# Patient Record
Sex: Female | Born: 1945
Health system: Southern US, Community
[De-identification: ages and names within clinical notes are randomized; demographics above are authoritative.]

## PROBLEM LIST (undated history)

## (undated) DIAGNOSIS — I10 Essential (primary) hypertension: Secondary | ICD-10-CM

## (undated) DIAGNOSIS — E785 Hyperlipidemia, unspecified: Secondary | ICD-10-CM

## (undated) DIAGNOSIS — F419 Anxiety disorder, unspecified: Secondary | ICD-10-CM

## (undated) DIAGNOSIS — K5730 Diverticulosis of large intestine without perforation or abscess without bleeding: Secondary | ICD-10-CM

## (undated) HISTORY — PX: TONSILLECTOMY: SUR1361

## (undated) HISTORY — PX: COLONOSCOPY: SHX174

## (undated) HISTORY — DX: Hyperlipidemia, unspecified: E78.5

## (undated) HISTORY — DX: Diverticulosis of large intestine without perforation or abscess without bleeding: K57.30

## (undated) HISTORY — PX: WISDOM TOOTH EXTRACTION: SHX21

## (undated) HISTORY — DX: Essential (primary) hypertension: I10

## (undated) HISTORY — PX: CYSTOSCOPY: SUR368

---

## 1998-10-15 ENCOUNTER — Encounter: Payer: Self-pay | Admitting: Obstetrics and Gynecology

## 1998-10-15 ENCOUNTER — Ambulatory Visit (HOSPITAL_COMMUNITY): Admission: RE | Admit: 1998-10-15 | Discharge: 1998-10-15 | Payer: Self-pay | Admitting: Obstetrics and Gynecology

## 1999-10-07 ENCOUNTER — Other Ambulatory Visit: Admission: RE | Admit: 1999-10-07 | Discharge: 1999-10-07 | Payer: Self-pay | Admitting: Obstetrics and Gynecology

## 2000-11-01 ENCOUNTER — Other Ambulatory Visit: Admission: RE | Admit: 2000-11-01 | Discharge: 2000-11-01 | Payer: Self-pay | Admitting: Obstetrics and Gynecology

## 2001-11-10 ENCOUNTER — Other Ambulatory Visit: Admission: RE | Admit: 2001-11-10 | Discharge: 2001-11-10 | Payer: Self-pay | Admitting: Obstetrics and Gynecology

## 2002-04-20 ENCOUNTER — Encounter: Payer: Self-pay | Admitting: Gastroenterology

## 2002-04-30 ENCOUNTER — Other Ambulatory Visit: Admission: RE | Admit: 2002-04-30 | Discharge: 2002-04-30 | Payer: Self-pay | Admitting: Obstetrics and Gynecology

## 2002-06-20 ENCOUNTER — Encounter: Payer: Self-pay | Admitting: Gastroenterology

## 2002-09-13 ENCOUNTER — Other Ambulatory Visit: Admission: RE | Admit: 2002-09-13 | Discharge: 2002-09-13 | Payer: Self-pay | Admitting: Obstetrics and Gynecology

## 2003-03-19 ENCOUNTER — Other Ambulatory Visit: Admission: RE | Admit: 2003-03-19 | Discharge: 2003-03-19 | Payer: Self-pay | Admitting: Obstetrics and Gynecology

## 2003-07-01 ENCOUNTER — Other Ambulatory Visit: Admission: RE | Admit: 2003-07-01 | Discharge: 2003-07-01 | Payer: Self-pay | Admitting: Obstetrics and Gynecology

## 2003-11-18 ENCOUNTER — Other Ambulatory Visit: Admission: RE | Admit: 2003-11-18 | Discharge: 2003-11-18 | Payer: Self-pay | Admitting: Obstetrics and Gynecology

## 2004-06-30 ENCOUNTER — Other Ambulatory Visit: Admission: RE | Admit: 2004-06-30 | Discharge: 2004-06-30 | Payer: Self-pay | Admitting: Obstetrics and Gynecology

## 2004-08-17 ENCOUNTER — Other Ambulatory Visit: Admission: RE | Admit: 2004-08-17 | Discharge: 2004-08-17 | Payer: Self-pay | Admitting: Obstetrics and Gynecology

## 2004-08-24 ENCOUNTER — Ambulatory Visit: Payer: Self-pay | Admitting: Internal Medicine

## 2004-09-14 ENCOUNTER — Ambulatory Visit: Payer: Self-pay | Admitting: Internal Medicine

## 2004-09-28 ENCOUNTER — Ambulatory Visit: Payer: Self-pay | Admitting: Internal Medicine

## 2005-02-10 ENCOUNTER — Other Ambulatory Visit: Admission: RE | Admit: 2005-02-10 | Discharge: 2005-02-10 | Payer: Self-pay | Admitting: Obstetrics and Gynecology

## 2005-07-09 ENCOUNTER — Other Ambulatory Visit: Admission: RE | Admit: 2005-07-09 | Discharge: 2005-07-09 | Payer: Self-pay | Admitting: Obstetrics and Gynecology

## 2005-11-22 ENCOUNTER — Ambulatory Visit: Payer: Self-pay | Admitting: Internal Medicine

## 2005-12-23 ENCOUNTER — Ambulatory Visit: Payer: Self-pay | Admitting: Internal Medicine

## 2006-01-07 ENCOUNTER — Encounter: Payer: Self-pay | Admitting: Internal Medicine

## 2006-01-07 LAB — CONVERTED CEMR LAB: Pap Smear: NORMAL

## 2006-01-07 LAB — HM MAMMOGRAPHY: HM Mammogram: NORMAL

## 2006-02-21 ENCOUNTER — Ambulatory Visit: Payer: Self-pay | Admitting: Internal Medicine

## 2006-07-04 ENCOUNTER — Ambulatory Visit: Payer: Self-pay | Admitting: Internal Medicine

## 2006-07-04 LAB — CONVERTED CEMR LAB
BUN: 19 mg/dL (ref 6–23)
CO2: 30 meq/L (ref 19–32)
Calcium: 9.2 mg/dL (ref 8.4–10.5)
Chloride: 105 meq/L (ref 96–112)
Chol/HDL Ratio, serum: 4.4
Cholesterol: 258 mg/dL (ref 0–200)
Creatinine, Ser: 0.8 mg/dL (ref 0.4–1.2)
GFR calc non Af Amer: 77 mL/min
Glomerular Filtration Rate, Af Am: 93 mL/min/{1.73_m2}
Glucose, Bld: 117 mg/dL — ABNORMAL HIGH (ref 70–99)
HDL: 59.3 mg/dL (ref >39.0)
LDL DIRECT: 180 mg/dL
Potassium: 3.6 meq/L (ref 3.5–5.1)
Sodium: 142 meq/L (ref 135–145)
Triglyceride fasting, serum: 114 mg/dL (ref 0–149)
VLDL: 23 mg/dL (ref 0–40)

## 2006-07-20 ENCOUNTER — Ambulatory Visit: Payer: Self-pay | Admitting: Internal Medicine

## 2006-09-09 ENCOUNTER — Ambulatory Visit: Payer: Self-pay | Admitting: Internal Medicine

## 2006-09-09 LAB — CONVERTED CEMR LAB
ALT: 19 units/L (ref 0–40)
AST: 18 units/L (ref 0–37)
Cholesterol: 164 mg/dL (ref 0–200)
HDL: 55.9 mg/dL (ref >39.0)
Hgb A1c MFr Bld: 5.7 % (ref 4.6–6.0)
LDL Cholesterol: 92 mg/dL (ref 0–99)
Total CHOL/HDL Ratio: 2.9
Triglycerides: 81 mg/dL (ref 0–149)
VLDL: 16 mg/dL (ref 0–40)

## 2006-09-21 ENCOUNTER — Ambulatory Visit: Payer: Self-pay | Admitting: Internal Medicine

## 2007-04-19 ENCOUNTER — Ambulatory Visit: Payer: Self-pay | Admitting: Internal Medicine

## 2007-04-19 LAB — CONVERTED CEMR LAB
ALT: 21 units/L (ref 0–35)
AST: 17 units/L (ref 0–37)
Cholesterol: 177 mg/dL (ref 0–200)
HDL: 56.1 mg/dL (ref >39.0)
Hgb A1c MFr Bld: 5.8 % (ref 4.6–6.0)
LDL Cholesterol: 106 mg/dL — ABNORMAL HIGH (ref 0–99)
Total CHOL/HDL Ratio: 3.2
Triglycerides: 73 mg/dL (ref 0–149)
VLDL: 15 mg/dL (ref 0–40)

## 2007-05-03 ENCOUNTER — Ambulatory Visit: Payer: Self-pay | Admitting: Internal Medicine

## 2007-05-03 ENCOUNTER — Encounter: Payer: Self-pay | Admitting: Internal Medicine

## 2007-05-03 DIAGNOSIS — I10 Essential (primary) hypertension: Secondary | ICD-10-CM

## 2007-05-03 DIAGNOSIS — E785 Hyperlipidemia, unspecified: Secondary | ICD-10-CM

## 2007-05-03 DIAGNOSIS — H6120 Impacted cerumen, unspecified ear: Secondary | ICD-10-CM

## 2007-05-03 HISTORY — DX: Hyperlipidemia, unspecified: E78.5

## 2007-05-03 HISTORY — DX: Essential (primary) hypertension: I10

## 2007-11-13 ENCOUNTER — Telehealth: Payer: Self-pay | Admitting: Internal Medicine

## 2007-11-13 ENCOUNTER — Ambulatory Visit: Payer: Self-pay | Admitting: Internal Medicine

## 2007-11-13 LAB — CONVERTED CEMR LAB
Bacteria, UA: NEGATIVE
Crystals: NEGATIVE
Hemoglobin, Urine: NEGATIVE
Ketones, ur: NEGATIVE mg/dL
RBC / HPF: NONE SEEN
Urine Glucose: NEGATIVE mg/dL
Urobilinogen, UA: 0.2 (ref 0.0–1.0)

## 2008-06-06 ENCOUNTER — Telehealth: Payer: Self-pay | Admitting: Internal Medicine

## 2008-10-16 ENCOUNTER — Encounter (INDEPENDENT_AMBULATORY_CARE_PROVIDER_SITE_OTHER): Payer: Self-pay | Admitting: *Deleted

## 2008-11-11 ENCOUNTER — Telehealth (INDEPENDENT_AMBULATORY_CARE_PROVIDER_SITE_OTHER): Payer: Self-pay | Admitting: *Deleted

## 2009-01-29 ENCOUNTER — Ambulatory Visit: Payer: Self-pay | Admitting: Internal Medicine

## 2009-01-29 LAB — CONVERTED CEMR LAB
ALT: 16 units/L (ref 0–35)
AST: 16 units/L (ref 0–37)
Alkaline Phosphatase: 87 units/L (ref 39–117)
Basophils Relative: 1.8 % (ref 0.0–3.0)
Bilirubin Urine: NEGATIVE
Bilirubin, Direct: 0.1 mg/dL (ref 0.0–0.3)
Chloride: 102 meq/L (ref 96–112)
Creatinine, Ser: 0.7 mg/dL (ref 0.4–1.2)
Eosinophils Relative: 5.5 % — ABNORMAL HIGH (ref 0.0–5.0)
GFR calc non Af Amer: 89.21 mL/min (ref >60)
Hemoglobin, Urine: NEGATIVE
LDL Cholesterol: 93 mg/dL (ref 0–99)
Lymphocytes Relative: 21.3 % (ref 12.0–46.0)
MCV: 88.5 fL (ref 78.0–100.0)
Monocytes Relative: 8.9 % (ref 3.0–12.0)
Neutrophils Relative %: 62.5 % (ref 43.0–77.0)
RBC: 4.09 M/uL (ref 3.87–5.11)
Total Bilirubin: 0.7 mg/dL (ref 0.3–1.2)
Total CHOL/HDL Ratio: 3
Total Protein, Urine: NEGATIVE mg/dL
Total Protein: 7.2 g/dL (ref 6.0–8.3)
Triglycerides: 66 mg/dL (ref 0.0–149.0)
Urine Glucose: NEGATIVE mg/dL
VLDL: 13.2 mg/dL (ref 0.0–40.0)
WBC: 7.1 10*3/uL (ref 4.5–10.5)
pH: 8 (ref 5.0–8.0)

## 2009-02-04 ENCOUNTER — Ambulatory Visit: Payer: Self-pay | Admitting: Internal Medicine

## 2009-03-07 ENCOUNTER — Telehealth: Payer: Self-pay | Admitting: Internal Medicine

## 2009-03-07 ENCOUNTER — Inpatient Hospital Stay (HOSPITAL_COMMUNITY): Admission: EM | Admit: 2009-03-07 | Discharge: 2009-03-10 | Payer: Self-pay | Admitting: Emergency Medicine

## 2009-03-17 ENCOUNTER — Telehealth: Payer: Self-pay | Admitting: Internal Medicine

## 2009-03-20 ENCOUNTER — Encounter: Payer: Self-pay | Admitting: Internal Medicine

## 2009-04-07 DIAGNOSIS — K5730 Diverticulosis of large intestine without perforation or abscess without bleeding: Secondary | ICD-10-CM

## 2009-04-07 DIAGNOSIS — K5732 Diverticulitis of large intestine without perforation or abscess without bleeding: Secondary | ICD-10-CM | POA: Insufficient documentation

## 2009-04-07 HISTORY — DX: Diverticulosis of large intestine without perforation or abscess without bleeding: K57.30

## 2009-04-10 ENCOUNTER — Ambulatory Visit: Payer: Self-pay | Admitting: Gastroenterology

## 2009-06-13 ENCOUNTER — Ambulatory Visit: Payer: Self-pay | Admitting: Gastroenterology

## 2009-06-23 ENCOUNTER — Encounter: Payer: Self-pay | Admitting: Gastroenterology

## 2009-06-23 ENCOUNTER — Ambulatory Visit: Payer: Self-pay | Admitting: Gastroenterology

## 2009-06-25 ENCOUNTER — Encounter: Payer: Self-pay | Admitting: Gastroenterology

## 2009-09-01 ENCOUNTER — Telehealth: Payer: Self-pay | Admitting: Internal Medicine

## 2009-12-31 ENCOUNTER — Telehealth: Payer: Self-pay | Admitting: Internal Medicine

## 2010-01-01 ENCOUNTER — Telehealth: Payer: Self-pay | Admitting: Internal Medicine

## 2010-01-26 ENCOUNTER — Telehealth: Payer: Self-pay | Admitting: Gastroenterology

## 2010-02-17 ENCOUNTER — Ambulatory Visit: Payer: Self-pay | Admitting: Gastroenterology

## 2010-02-17 ENCOUNTER — Ambulatory Visit: Payer: Self-pay | Admitting: Internal Medicine

## 2010-02-17 LAB — CONVERTED CEMR LAB
AST: 17 units/L (ref 0–37)
Albumin: 4.3 g/dL (ref 3.5–5.2)
Alkaline Phosphatase: 82 units/L (ref 39–117)
BUN: 15 mg/dL (ref 6–23)
Bilirubin Urine: NEGATIVE
CO2: 30 meq/L (ref 19–32)
Chloride: 100 meq/L (ref 96–112)
Cholesterol: 211 mg/dL — ABNORMAL HIGH (ref 0–200)
Creatinine, Ser: 0.7 mg/dL (ref 0.4–1.2)
Eosinophils Relative: 3.8 % (ref 0.0–5.0)
HCT: 37.3 % (ref 36.0–46.0)
Hemoglobin: 13.2 g/dL (ref 12.0–15.0)
Ketones, ur: NEGATIVE mg/dL
Leukocytes, UA: NEGATIVE
Lymphs Abs: 1.2 10*3/uL (ref 0.7–4.0)
MCV: 88.8 fL (ref 78.0–100.0)
Monocytes Absolute: 0.5 10*3/uL (ref 0.1–1.0)
Monocytes Relative: 8.5 % (ref 3.0–12.0)
Neutro Abs: 3.7 10*3/uL (ref 1.4–7.7)
Platelets: 341 10*3/uL (ref 150.0–400.0)
Potassium: 3.8 meq/L (ref 3.5–5.1)
Specific Gravity, Urine: 1.01 (ref 1.000–1.030)
Total Bilirubin: 0.8 mg/dL (ref 0.3–1.2)
Total CHOL/HDL Ratio: 3
Total Protein, Urine: NEGATIVE mg/dL
Triglycerides: 142 mg/dL (ref 0.0–149.0)
Urine Glucose: NEGATIVE mg/dL
VLDL: 28.4 mg/dL (ref 0.0–40.0)
WBC: 5.7 10*3/uL (ref 4.5–10.5)
pH: 6.5 (ref 5.0–8.0)

## 2010-02-20 ENCOUNTER — Ambulatory Visit: Payer: Self-pay | Admitting: Internal Medicine

## 2010-06-04 ENCOUNTER — Telehealth: Payer: Self-pay | Admitting: Internal Medicine

## 2010-06-27 IMAGING — CT CT PELVIS W/ CM
1 of 2 series · 15 of 32 positions shown, 19 images · IV contrast (agent unspecified)
Comparison: None.

CT ABDOMEN

CLINICAL DATA: Lower abdominal pain, nausea, fever, diarrhea,
leukocytosis.

CT ABDOMEN AND PELVIS WITH CONTRAST
TECHNIQUE: Multidetector CT imaging of the abdomen and pelvis was
performed using the standard protocol following bolus
administration of intravenous contrast.
Contrast: 100 ml Imnipaque-EAA

[Series 2: rtn ap with st · axial · 0.63mm/px · z∈[+900,+1315]mm · 15 of 93 slices shown, 19 images]
[im 5/93  soft-tissue]
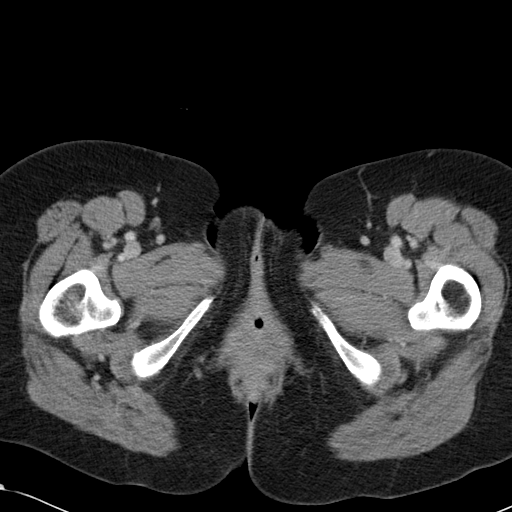
[im 5/93  bone]
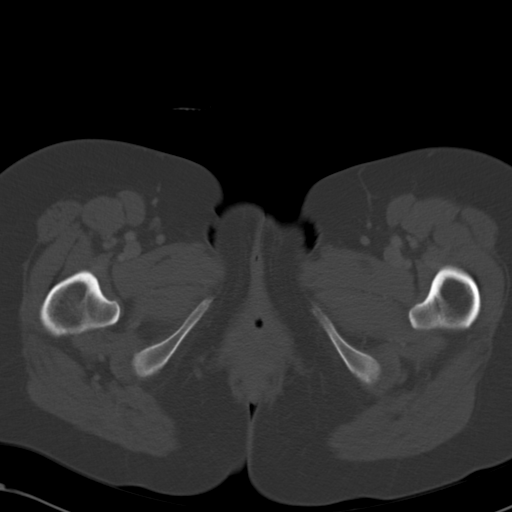
[im 13/93  soft-tissue]
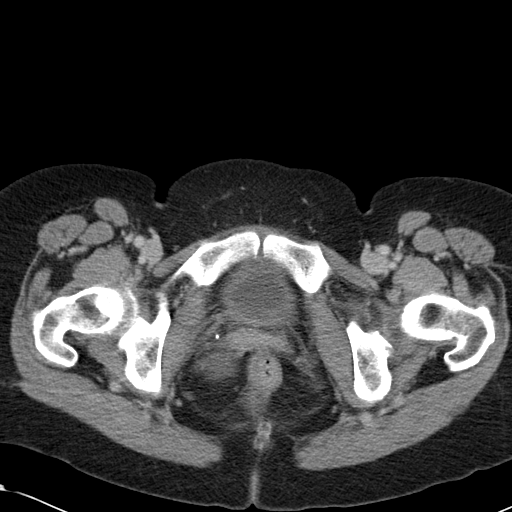
[im 21/93  soft-tissue]
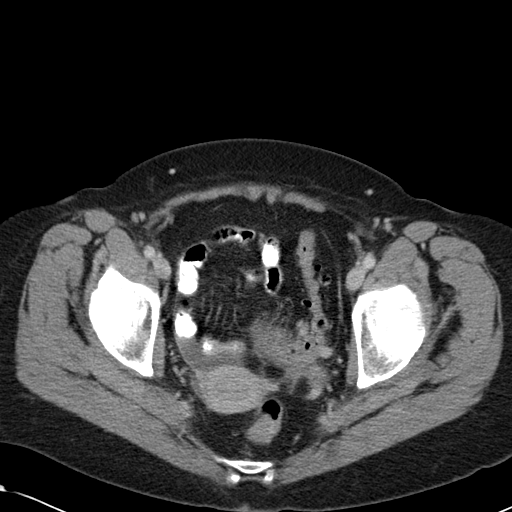
[im 26/93  soft-tissue]
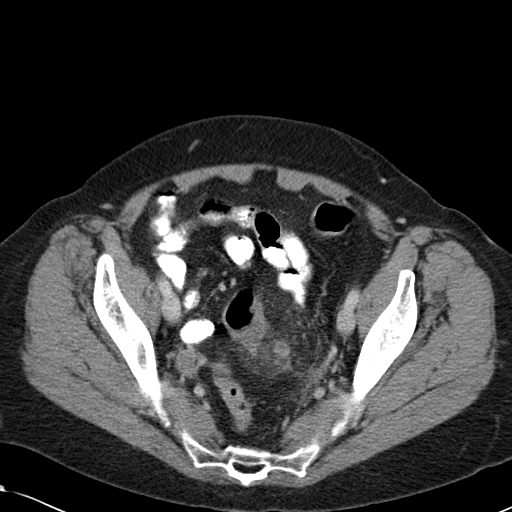
[im 34/93  soft-tissue]
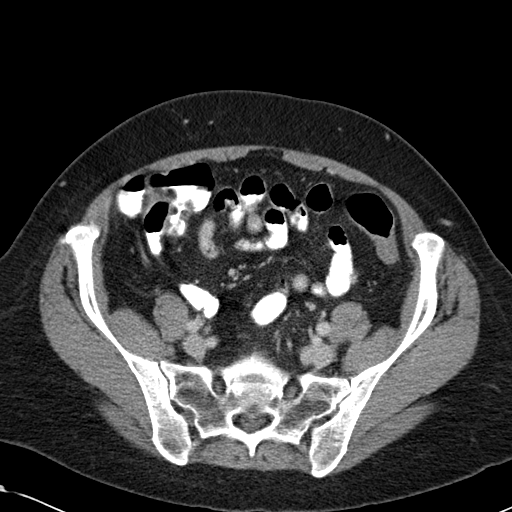
[im 38/93  soft-tissue]
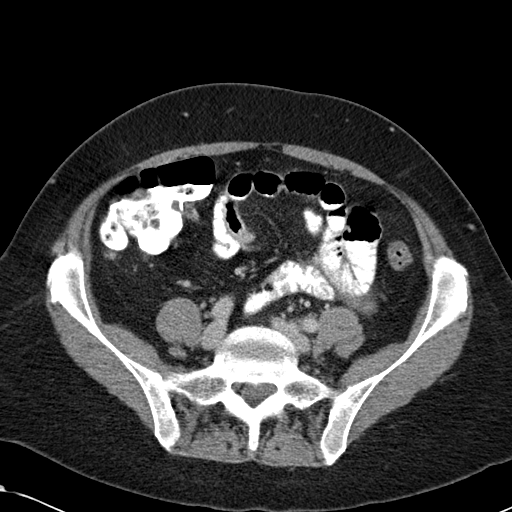
[im 47/93  soft-tissue]
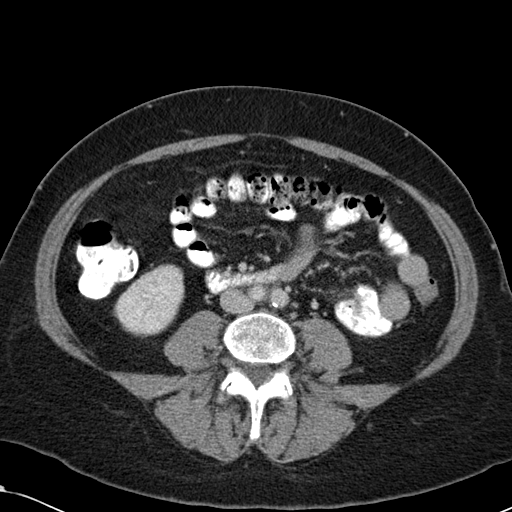
[im 55/93  soft-tissue]
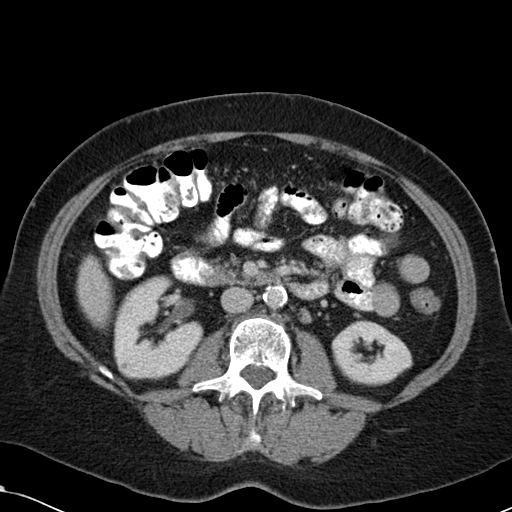
[im 59/93  soft-tissue]
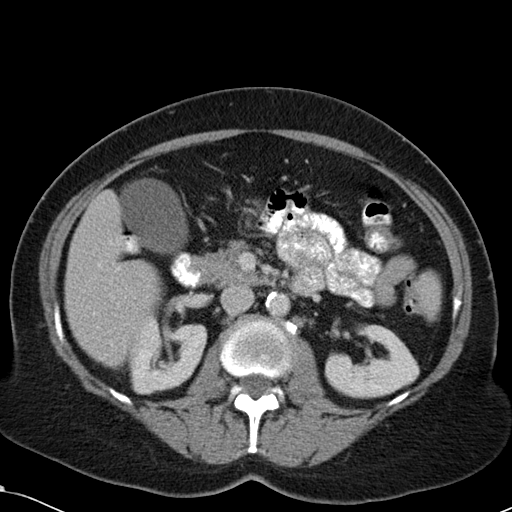
[im 59/93  bone]
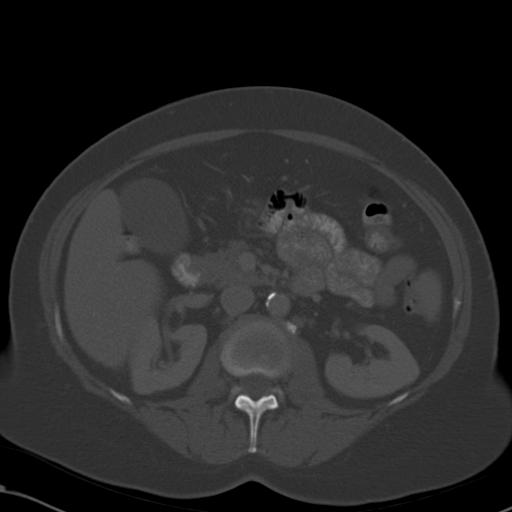
[im 67/93  soft-tissue]
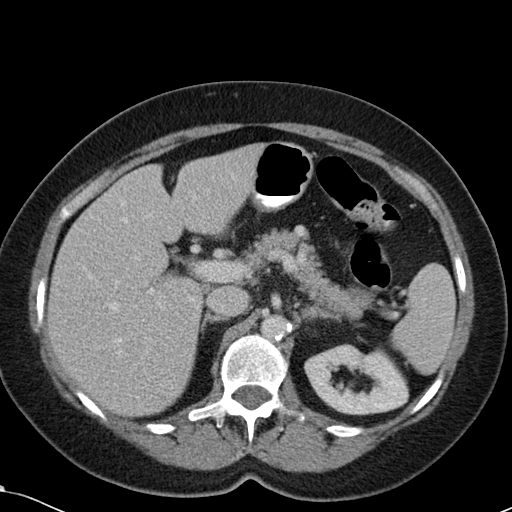
[im 72/93  soft-tissue]
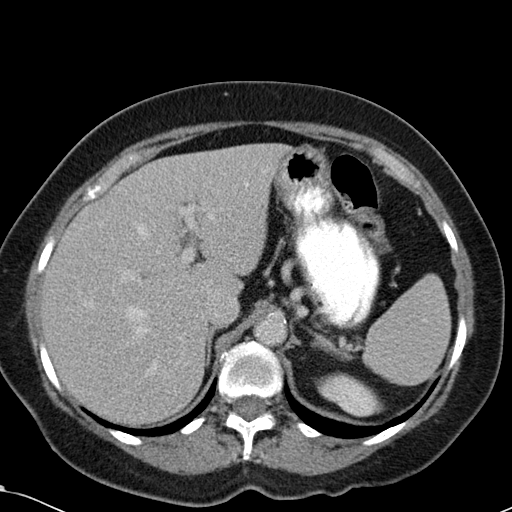
[im 76/93  lung]
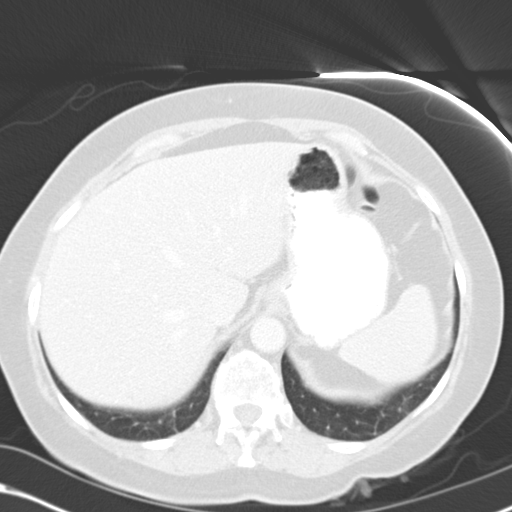
[im 80/93  soft-tissue]
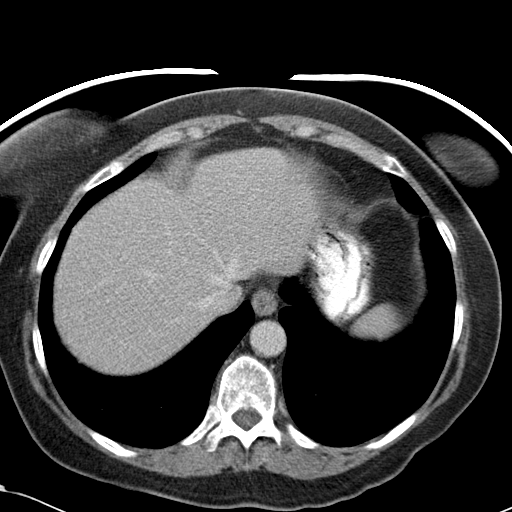
[im 80/93  lung]
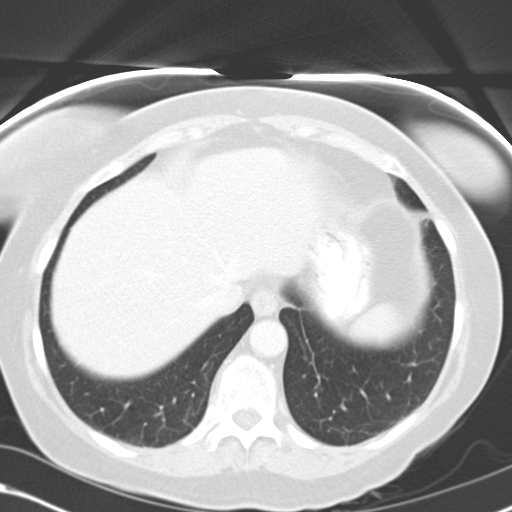
[im 84/93  lung]
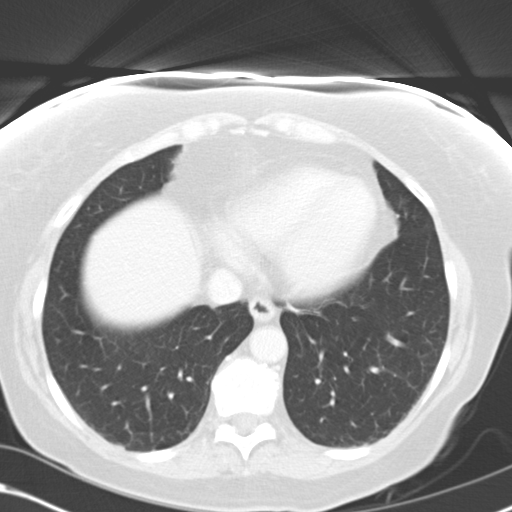
[im 88/93  soft-tissue]
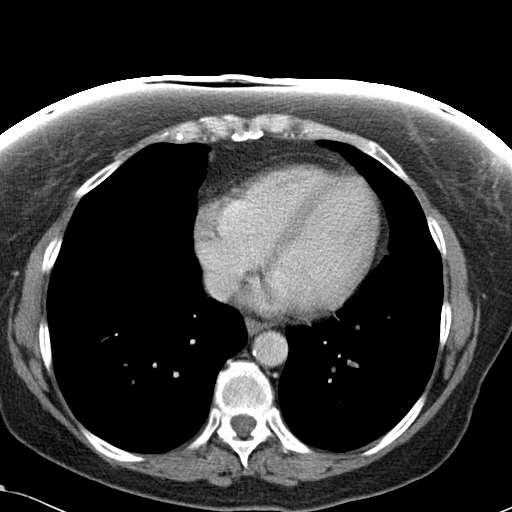
[im 88/93  lung]
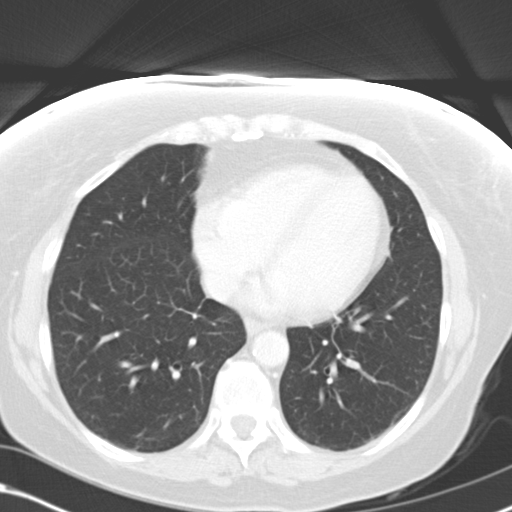

[15 of 32 positions shown; findings below may reference images not displayed]

FINDINGS: Normal appearing liver, spleen, pancreas, gallbladder,
adrenal glands and kidneys.  No gastrointestinal abnormalities or
enlarged lymph nodes.  Clear lung bases.  Mild lumbar and lower
thoracic spine degenerative changes.
IMPRESSION: No acute abnormality.

CT PELVIS
FINDINGS: Concentric wall thickening involving a 5 cm long segment
of the mid sigmoid colon with adjacent soft tissue stranding.
Multiple sigmoid colon diverticula.  No fluid collections.  Minimal
free peritoneal fluid.  Unremarkable uterus and ovaries.  No
enlarged lymph nodes or free peritoneal air.  Unremarkable pelvic
bones.
IMPRESSION: 1.  Sigmoid diverticulitis without abscess.  Follow-up
sigmoidoscopy is recommended to exclude an underlying neoplasm.
2.  Minimal free peritoneal fluid.
3.  Extensive sigmoid diverticulosis.

## 2010-09-08 NOTE — Progress Notes (Signed)
    Immunization History:  Influenza Immunization History:    Influenza:  0.64ml left deltoid exp date 01/2011 (06/04/2010)

## 2010-09-08 NOTE — Assessment & Plan Note (Signed)
Summary: Follow up diverticulitis/dfs    History of Present Illness Visit Type: Follow-up Visit Primary GI MD: Sheryn Bison MD FACP FAGA Primary Provider: Illene Regulus, MD Requesting Provider: n/a Chief Complaint: Patient here for f/u of diverticulitis. She states that she is now doing well, no abdominal pain or diarrhea. She occasionally has some lower abdominal "gassy" pains. 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 She had colonoscopy performed last year which is otherwise unremarkable.  She recently had an episode of left lower quadrant pain which is now resolved after 10 days of Cipro and metronidazole therapy. Currently her bowels are normal and she denies melena or hematochezia, upper GI, with systemic symptomatology. She is scheduled for complete physical examination with Dr. Debby Bud later this week.   GI Review of Systems    Reports abdominal pain and  bloating.     Location of  Abdominal pain: lower abdomen.    Denies acid reflux, belching, chest pain, dysphagia with liquids, dysphagia with solids, heartburn, loss of appetite, nausea, vomiting, vomiting blood, weight loss, and  weight gain.      Reports diverticulosis and  hemorrhoids.     Denies anal fissure, black tarry stools, change in bowel habit, constipation, diarrhea, fecal incontinence, heme positive stool, irritable bowel syndrome, jaundice, light color stool, liver problems, rectal bleeding, and  rectal pain. Preventive Screening-Counseling & Management  Caffeine-Diet-Exercise     Does Patient Exercise: yes      Drug Use:  no.      Current Medications (verified): 1)  Lipitor 10 Mg  Tabs (Atorvastatin Calcium) .... Q Pm 2)  Amlodipine Besylate 10 Mg  Tabs (Amlodipine Besylate) .... Once Daily 3)  Potassium Chloride Cr 10 Meq  Cpcr (Potassium Chloride) .... Once Daily 4)   Diovan Hct 160-25 Mg  Tabs (Valsartan-Hydrochlorothiazide) .... Once Daily  Allergies (verified): 1)  ! Sulfa 2)  ! Macrobid  Past History:  Past medical, surgical, family and social histories (including risk factors) reviewed for relevance to current acute and chronic problems.  Past Medical History: Reviewed history from 04/07/2009 and no changes required. Current Problems:  DIVERTICULOSIS, COLON (ICD-562.10) CERUMEN IMPACTION, BILATERAL (ICD-380.4) HYPERTENSION (ICD-401.9) HYPERLIPIDEMIA (ICD-272.4)  Past Surgical History: G2P2 unremarkable  Family History: Reviewed history from 04/10/2009 and no changes required. father- CAD/MI,  mother - died 74, CHF sister- good health Family History of Breast Cancer:M Aunt Family History of Diabetes: M uncle and cousin Family History of Heart Disease: father No FH of Colon Cancer:  Social History: Reviewed history from 04/10/2009 and no changes required. 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  Illicit Drug Use - no Alcohol Use - yes-occasional Patient gets regular exercise. Does Patient Exercise:  yes  Review of Systems  The patient denies anorexia, fever, weight loss, weight gain, vision loss, decreased hearing, hoarseness, chest pain, syncope, dyspnea on exertion, peripheral edema, prolonged cough, headaches, hemoptysis, abdominal pain, melena, hematochezia, severe indigestion/heartburn, hematuria, incontinence, genital sores, muscle weakness, suspicious skin lesions, transient blindness, difficulty walking, depression, unusual weight change, abnormal bleeding, enlarged lymph nodes, angioedema, breast masses, and testicular masses.    Vital Signs:  Patient profile:   65 year old female Height:      67 inches Weight:      171.25 pounds BMI:     26.92 BSA:  1.89 Pulse rate:   80 / minute Pulse rhythm:    regular BP sitting:   142 / 70  (right arm)  Vitals Entered By: Lamona Curl CMA Duncan Dull) (February 17, 2010 10:27 AM)  Physical Exam  General:  Well developed, well nourished, no acute distress.healthy appearing.   Head:  Normocephalic and atraumatic. Eyes:  PERRLA, no icterus.exam deferred to patient's ophthalmologist.   Abdomen:  Soft, nontender and nondistended. No masses, hepatosplenomegaly or hernias noted. Normal bowel sounds. Rectal:  deferred Psych:  Alert and cooperative. Normal mood and affect.   Impression & Recommendations:  Problem # 1:  DIVERTICULOSIS, COLON (ICD-562.10) Assessment Improved Continue high-fiber diet as tolerated, and I have recommended daily Benefiber with her cereal. I have given her a prescription for metronidazole 500 mg, Cipro 500 mg, each to be taken twice a day for 7-10 days if she has further attacks of diverticulitis. She has been seen previously by Dr. Glenna Fellows in surgery. I have discussed the need for possible future surgery depending on her clinical course and outcome. Prescription also given for p.r.n. sublingual Levsin for abdominal spasms. The lab test or further x-rays were ordered today.  Problem # 2:  HYPERTENSION (ICD-401.9) Assessment: Improved blood pressure today 142/70 and pulse 80 and regular. Patient continue other medications as per primary care.  Patient Instructions: 1)  Please schedule a follow-up appointment as needed.  2)  You aren given Rx for cipro and flagyl to use as needed. 3)  Call our office if symptoms do not improve with use of antiobiotics. 4)  You may use levsin as needed.  A prescription will be sent to your pharmacy for this also. 5)  The medication list was reviewed and reconciled.  All changed / newly prescribed medications were explained.  A complete medication list was provided to the patient / caregiver. 6)  Copy sent to : Dr. Leonard Downing medicine. 7)  Please continue current  medications.  8)  Diet should be high in fiber ( fruits, vegetables, whole grains) but low in residue. Drink at least eight (8) glasses of water a day.   Appended Document: Follow up diverticulitis/dfs    Clinical Lists Changes  Medications: Added new medication of CIPROFLOXACIN HCL 500 MG  TABS (CIPROFLOXACIN HCL) Take 1 twice a day times 10 days - Signed Added new medication of METRONIDAZOLE 500 MG  TABS (METRONIDAZOLE) Take 1 twice a day times 10 days - Signed Added new medication of LEVSIN/SL 0.125 MG  SUBL (HYOSCYAMINE SULFATE) 1 SL 1 4-6 hrs as needed - Signed Rx of CIPROFLOXACIN HCL 500 MG  TABS (CIPROFLOXACIN HCL) Take 1 twice a day times 10 days;  #20 x 3;  Signed;  Entered by: Ashok Cordia RN;  Authorized by: Mardella Layman MD [facility];  Method used: Electronically to [facility] [address] [place], [place], [place]  [address], Ph: [phone], Fax: [phone] Rx of METRONIDAZOLE 500 MG  TABS (METRONIDAZOLE) Take 1 twice a day times 10 days;  #20 x 3;  Signed;  Entered by: Ashok Cordia RN;  Authorized by: Mardella Layman MD [facility];  Method used: Electronically to [facility] [address], [place], [place], [place]  [address], Ph: [phone], Fax: [phone] Rx of LEVSIN/SL 0.125 MG  SUBL (HYOSCYAMINE SULFATE) 1 SL 1 4-6 hrs as needed;  #60 x 3;  Signed;  Entered by: Ashok Cordia RN;  Authorized by: Mardella Layman MD [facility];  Method used: Electronically to [facility] [address], [place], [place], [place]  [address], Ph: [phone], Fax: [phone]    Prescriptions: LEVSIN/SL 0.125 MG  SUBL (HYOSCYAMINE SULFATE) 1 SL 1 4-6 hrs as needed  #60 x 3   Entered by:   Ashok Cordia RN   Authorized by:   Mardella Layman MD [facility]   Signed by:   Ashok Cordia RN on 02/17/2010   Method used:   Electronically to        [facility] [address] (retail)        [address]       [place], [place]  [address]       Ph: [phone]       Fax: [phone]   RxID:   [id number] METRONIDAZOLE 500 MG  TABS (METRONIDAZOLE) Take 1 twice a day times 10 days  #20 x 3   Entered by:   Ashok Cordia RN   Authorized by:   Mardella Layman MD [facility]   Signed by:   Ashok Cordia RN on 02/17/2010   Method used:   Electronically to        [facility] [address] (retail)       [address]       [place], [place]  [address]       Ph: [phone]       Fax: [phone]   RxID:   [id number] CIPROFLOXACIN HCL 500 MG  TABS (CIPROFLOXACIN HCL) Take 1 twice a day times 10 days  #20 x 3   Entered by:   Ashok Cordia RN   Authorized by:   Mardella Layman MD [facility]   Signed by:   Ashok Cordia RN on 02/17/2010   Method used:   Electronically to        [facility] [address] (retail)       [address]       [place], [place]  [address]       Ph: [phone]       Fax: [phone]   RxID:   [id number]

## 2010-09-08 NOTE — Progress Notes (Signed)
Summary: diverticulitis symptoms  Medications Added METRONIDAZOLE 500 MG  TABS (METRONIDAZOLE) Take 1 twice a day times 10 days LEVSIN/SL 0.125 MG  SUBL (HYOSCYAMINE SULFATE) 1 SL q 4-6 hrs as needed CIPROFLOXACIN HCL 500 MG  TABS (CIPROFLOXACIN HCL) Take 1 twice a day times 10 days       Phone Note Call from Patient Call back at [facility] Phone [phone]   Caller: Patient Call For: Dr. Jarold Motto Reason for Call: Talk to Nurse Summary of Call: experiencing similar symptoms to last year when pt was diagnosed with diverticulitis... would like to discuss this with a nurse Initial call taken by: Vallarie Mare,  January 26, 2010 2:24 PM  Follow-up for Phone Call        Pt began 3-4 days ago with lower abd pain, aching feeling in lower abd.  Pt state this is the same pain that shehad last when she was diagnosed with diverticulitis.  Pt has 20 Cipro 500mg  tabs left over from last year.  Asking if it is OK to take these and if so does she need some flagly to go along with them?  Pt had colon 6 mo ago. Follow-up by: Ashok Cordia RN,  January 26, 2010 2:40 PM    Additional Follow-up for Phone Call Additional follow up Details #2::    Cipro 500 mg twice a day and metronidazole 500 mg twice a day for 10 days with p.r.n. Levsin use suggested and office   followup in 2-3 weeks. Follow-up by: Mardella Layman MD FACG,  January 26, 2010 3:05 PM  Additional Follow-up for Phone Call Additional follow up Details #3:: Details for Additional Follow-up Action Taken: Pt notified.  rx sent. Follow up appt sch.  Pt instructed to call back if symptoms worsen. Additional Follow-up by: Ashok Cordia RN,  January 26, 2010 3:16 PM  New/Updated Medications: METRONIDAZOLE 500 MG  TABS (METRONIDAZOLE) Take 1 twice a day times 10 days LEVSIN/SL 0.125 MG  SUBL (HYOSCYAMINE SULFATE) 1 SL q 4-6 hrs as needed CIPROFLOXACIN HCL 500 MG  TABS (CIPROFLOXACIN HCL) Take 1 twice a day times 10 days Prescriptions: LEVSIN/SL 0.125 MG   SUBL (HYOSCYAMINE SULFATE) 1 SL q 4-6 hrs as needed  #30 x 1   Entered by:   Ashok Cordia RN   Authorized by:   Mardella Layman MD [facility]   Signed by:   Ashok Cordia RN on 01/26/2010   Method used:   Electronically to        [facility] [address] (retail)       [address]       [place], [place]  [address]       Ph: [phone]       Fax: [phone]   RxID:   [id number] METRONIDAZOLE 500 MG  TABS (METRONIDAZOLE) Take 1 twice a day times 10 days  #20 x 0   Entered by:   Ashok Cordia RN   Authorized by:   Mardella Layman MD [facility]   Signed by:   Ashok Cordia RN on 01/26/2010   Method used:   Electronically to        [facility] [address] (retail)       [address]       [place], [place]  [address]       Ph: [phone]       Fax:  [phone]   RxID:   [id number]

## 2010-09-08 NOTE — Progress Notes (Signed)
Summary: DIOVAN  Phone Note From Pharmacy   Summary of Call: Pharm called, pt had been filling diovan 160/12/5 mg daily and EMR has 160/25mg  daily. Please advise.  Initial call taken by: Lamar Sprinkles, CMA,  Jan 01, 2010 10:49 AM  Follow-up for Phone Call        go with med list: diovan/HCT 160/25 Follow-up by: Jacques Navy MD,  Jan 01, 2010 6:12 PM  Additional Follow-up for Phone Call Additional follow up Details #1::        Left vm for pharm Additional Follow-up by: Lamar Sprinkles, CMA,  Jan 02, 2010 8:35 AM

## 2010-09-08 NOTE — Progress Notes (Signed)
Summary: PHARM ?  Phone Note From Pharmacy   Caller: [phone] [facility] [address] Summary of Call: Pharm needs a call back to confirm dose of pt's BP med.  Initial call taken by: Lamar Sprinkles, CMA,  Dec 31, 2009 12:06 PM  Follow-up for Phone Call        Left vm for pharmacist confirming dose of diovan Follow-up by: Lamar Sprinkles, CMA,  Dec 31, 2009 6:31 PM

## 2010-09-08 NOTE — Assessment & Plan Note (Signed)
Summary: PER SARAH/YEARLY--STC   Vital Signs:  Patient profile:   65 year old female Height:      67 inches Weight:      172 pounds BMI:     27.04 O2 Sat:      98 % on Room air Temp:     98.4 degrees F oral Pulse rate:   78 / minute BP sitting:   136 / 70  (left arm) Cuff size:   regular  Vitals Entered By: Bill Salinas CMA (February 20, 2010 1:16 PM)  O2 Flow:  Room air CC: CPX/ ab  Vision Screening:      Vision Comments: Pt is due for yearly eye exam   Primary Care Provider:  Illene Regulus, MD  CC:  CPX/ ab.  History of Present Illness: Presents for routine exam. INterval hx she had a strain of the lateral collateral ligament of the right knee which curtailed activities for several weeks. She also had a flare of divericulitis treated with antibiotics by Dr. Jarold Motto.  She has really enjoyed retirement: she doesn't miss the work, just the people.  She would recommend it.   Current Medications (verified): 1)  Lipitor 10 Mg  Tabs (Atorvastatin Calcium) .... Q Pm 2)  Amlodipine Besylate 10 Mg  Tabs (Amlodipine Besylate) .... Once Daily 3)  Potassium Chloride Cr 10 Meq  Cpcr (Potassium Chloride) .... Once Daily 4)  Diovan Hct 160-25 Mg  Tabs (Valsartan-Hydrochlorothiazide) .... Once Daily  Allergies (verified): 1)  ! Sulfa 2)  ! Macrobid  Past History:  Past Medical History: Last updated: 04/07/2009 Current Problems:  DIVERTICULOSIS, COLON (ICD-562.10) CERUMEN IMPACTION, BILATERAL (ICD-380.4) HYPERTENSION (ICD-401.9) HYPERLIPIDEMIA (ICD-272.4)  Past Surgical History: Last updated: 02/17/2010 G2P2 unremarkable  Family History: Last updated: 02/17/2010 father- CAD/MI,  mother - died 40, CHF sister- good health Family History of Breast Cancer:M Aunt Family History of Diabetes: M uncle and cousin Family History of Heart Disease: father No FH of Colon Cancer:  Social History: Last updated: 02/17/2010 married - 66yrs, 30 years first marriage-widowed 1 son,  1 daughter, 4 grandchildren (2b 2g) 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  Illicit Drug Use - no Alcohol Use - yes-occasional Patient gets regular exercise.  Risk Factors: Smoking Status: never (04/10/2009)  Review of Systems  The patient denies anorexia, fever, weight loss, weight gain, vision loss, decreased hearing, chest pain, syncope, dyspnea on exertion, peripheral edema, hemoptysis, abdominal pain, severe indigestion/heartburn, incontinence, muscle weakness, difficulty walking, unusual weight change, enlarged lymph nodes, and angioedema.    Physical Exam  General:  WNWD white female in no distress. Head:  Normocephalic and atraumatic without obvious abnormalities. No apparent alopecia or balding. Eyes:  No corneal or conjunctival inflammation noted. EOMI. Perrla. Funduscopic exam benign, without hemorrhages, exudates or papilledema. Vision grossly normal. Ears:  External ear exam shows no significant lesions or deformities.  Otoscopic examination reveals clear canals, tympanic membranes are intact bilaterally without bulging, retraction, inflammation or discharge. Hearing is grossly normal bilaterally. Nose:  no external deformity and no external erythema.   Mouth:  Oral mucosa and oropharynx without lesions or exudates.  Teeth in good repair. Neck:  supple, full ROM, no thyromegaly, and no carotid bruits.   Chest Wall:  No deformities, masses, or tenderness noted. Breasts:  deferred to gyn Lungs:  Normal respiratory effort, chest expands symmetrically. Lungs are clear to auscultation, no crackles or wheezes. Heart:  Normal rate and regular rhythm. S1 and S2 normal without gallop, murmur, click, rub  or other extra sounds. Abdomen:  soft, non-tender, normal bowel sounds, no guarding, and no hepatomegaly.   Genitalia:  deferred to gyn Msk:  normal ROM, no joint tenderness, no joint swelling, no joint warmth, no redness over joints, and no joint deformities.  Right  knee with full ROM and normal weight bearing Pulses:  2+ radial and DP Extremities:  No clubbing, cyanosis, edema, or deformity noted with normal full range of motion of all joints.   Neurologic:  alert & oriented X3, cranial nerves II-XII intact, strength normal in all extremities, gait normal, and DTRs symmetrical and normal.   Skin:  turgor normal, color normal, and no suspicious lesions.   Cervical Nodes:  no anterior cervical adenopathy and no posterior cervical adenopathy.   Psych:  Oriented X3, memory intact for recent and remote, normally interactive, good eye contact, and not anxious appearing.     Impression & Recommendations:  Problem # 1:  HYPERTENSION (ICD-401.9)  Her updated medication list for this problem includes:    Amlodipine Besylate 10 Mg Tabs (Amlodipine besylate) ..... Once daily    Diovan Hct 160-25 Mg Tabs (Valsartan-hydrochlorothiazide) ..... Once daily  BP today: 136/70 Prior BP: 142/70 (02/17/2010)  Adequate control on present medicatons. She she monitor her BP from time to time to insure on-going control  Problem # 2:  HYPERLIPIDEMIA (ICD-272.4) At goal with LDL of 128 with goal of 130 or less. Cardiac risk is low.  Plan - continue lipitor at present dose.  Her updated medication list for this problem includes:    Lipitor 10 Mg Tabs (Atorvastatin calcium) ..... Q pm  Problem # 3:  KNEE PAIN, RIGHT, ACUTE (ICD-719.46) strain of the lateral collateral ligament by her description which is now resolved.  Problem # 4:  Preventive Health Care (ICD-V70.0) She has had normal gyn exam. Her exam today is normal. Lab results are within normal limits. She is current with mammography. Last colonoscopy 2010.She is current with tetnus and pneumonia immunization. She has a very positive attitude. She has no increased fall or injury risk, and is now more careful with her knee. She is health conscious and invests in her health.  In summary - a delightful woman who  appears to be medically stable at this exam. She will return in 1 year or as needed.   Complete Medication List: 1)  Lipitor 10 Mg Tabs (Atorvastatin calcium) .... Q pm 2)  Amlodipine Besylate 10 Mg Tabs (Amlodipine besylate) .... Once daily 3)  Potassium Chloride Cr 10 Meq Cpcr (Potassium chloride) .... Once daily 4)  Diovan Hct 160-25 Mg Tabs (Valsartan-hydrochlorothiazide) .... Once daily  Other Orders: T-2 View CXR (71020TC) Subsequent annual wellness visit with prevention plan (Z6109)   Patient: Julia Sanchez Note: All result statuses are Final unless otherwise noted.  Tests: (1) BMP (METABOL)   Sodium                    137 mEq/L                   135-145   Potassium                 3.8 mEq/L                   3.5-5.1   Chloride                  100 mEq/L  96-112   Carbon Dioxide            30 mEq/L                    19-32   Glucose              [H]  105 mg/dL                   16-10   BUN                       15 mg/dL                    9-60   Creatinine                0.7 mg/dL                   4.5-4.0   Calcium                   9.7 mg/dL                   9.8-11.9   GFR                       91.95 mL/min                >60  Tests: (2) Lipid Panel (LIPID)   Cholesterol          [H]  211 mg/dL                   1-478     ATP III Classification            Desirable:  < 200 mg/dL                    Borderline High:  200 - 239 mg/dL               High:  > = 240 mg/dL   Triglycerides             142.0 mg/dL                 2.9-562.1     Normal:  <150 mg/dL     Borderline High:  308 - 199 mg/dL   HDL                       65.78 mg/dL                 >46.96   VLDL Cholesterol          28.4 mg/dL                  2.9-52.8  CHO/HDL Ratio:  CHD Risk                             3                    Men          Women     1/2 Average Risk     3.4          3.3     Average Risk          5.0  4.4     2X Average Risk          9.6          7.1     3X  Average Risk          15.0          11.0                           Tests: (3) Hepatic/Liver Function Panel (HEPATIC)   Total Bilirubin           0.8 mg/dL                   1.6-1.0   Direct Bilirubin          0.1 mg/dL                   9.6-0.4   Alkaline Phosphatase      82 U/L                      39-117   AST                       17 U/L                      0-37   ALT                       21 U/L                      0-35   Total Protein             7.2 g/dL                    5.4-0.9   Albumin                   4.3 g/dL                    8.1-1.9  Tests: (4) CBC Platelet w/Diff (CBCD)   White Cell Count          5.7 K/uL                    4.5-10.5   Red Cell Count            4.20 Mil/uL                 3.87-5.11   Hemoglobin                13.2 g/dL                   14.7-82.9   Hematocrit                37.3 %                      36.0-46.0   MCV                       88.8 fl                     78.0-100.0   MCHC  35.3 g/dL                   16.1-09.6   RDW                       13.1 %                      11.5-14.6   Platelet Count            341.0 K/uL                  150.0-400.0   Neutrophil %              66.1 %                      43.0-77.0   Lymphocyte %              21.0 %                      12.0-46.0   Monocyte %                8.5 %                       3.0-12.0   Eosinophils%              3.8 %                       0.0-5.0   Basophils %               0.6 %                       0.0-3.0   Neutrophill Absolute      3.7 K/uL                    1.4-7.7   Lymphocyte Absolute       1.2 K/uL                    0.7-4.0   Monocyte Absolute         0.5 K/uL                    0.1-1.0  Eosinophils, Absolute                             0.2 K/uL                    0.0-0.7   Basophils Absolute        0.0 K/uL                    0.0-0.1  Tests: (5) TSH (TSH)   FastTSH                   1.68 uIU/mL                 0.35-5.50  Tests: (6) UDip Only (UDIP)    Color                     LT. YELLOW       RANGE:  Yellow;Lt. Yellow   Clarity  CLEAR                       Clear   Specific Gravity          1.010                       1.000 - 1.030   Urine Ph                  6.5                         5.0-8.0   Protein                   NEGATIVE                    Negative   Urine Glucose             NEGATIVE                    Negative   Ketones                   NEGATIVE                    Negative   Urine Bilirubin           NEGATIVE                    Negative   Blood                     NEGATIVE                    Negative   Urobilinogen              0.2                         0.0 - 1.0   Leukocyte Esterace        NEGATIVE                    Negative   Nitrite                   NEGATIVE                    Negative  Tests: (7) Cholesterol LDL - Direct (DIRLDL)  Cholesterol LDL - Direct                             128.0 mg/dL     Optimal:  <161 mg/dL     Near or Above Optimal:  100-129 mg/dL     Borderline High:  096-045 mg/dL     High:  409-811 mg/dL     Very High:  >914 mg/dL   Immunization History:  Tetanus/Td Immunization History:    Tetanus/Td:  historical (10/11/2004)  Influenza Immunization History:    Influenza:  historical (05/13/2009)    Appended Document: PER SARAH/YEARLY--STC She is not depressed. She is independent in her ADLs

## 2010-09-08 NOTE — Progress Notes (Signed)
Summary: REFILLs  Phone Note Call from Patient Call back at Home Phone [phone]   Summary of Call: Patient is requesting written rx's of all meds to pick up.  Initial call taken by: Lamar Sprinkles, CMA,  September 01, 2009 9:33 AM  Follow-up for Phone Call        k Follow-up by: Jacques Navy MD,  September 01, 2009 1:05 PM  Additional Follow-up for Phone Call Additional follow up Details #1::        Pt informed  Additional Follow-up by: Lamar Sprinkles, CMA,  September 01, 2009 2:12 PM    Prescriptions: DIOVAN HCT 160-25 MG  TABS (VALSARTAN-HYDROCHLOROTHIAZIDE) once daily  #90 x 3   Entered by:   Lamar Sprinkles, CMA   Authorized by:   Jacques Navy MD   Signed by:   Lamar Sprinkles, CMA on 09/01/2009   Method used:   Electronically to        [facility] [address] (retail)       [address]       [place], [place]  [address]       Ph: [phone]       Fax: [phone]   RxID:   [id number] POTASSIUM CHLORIDE CR 10 MEQ  CPCR (POTASSIUM CHLORIDE) once daily  #90 x 3   Entered by:   Lamar Sprinkles, CMA   Authorized by:   Jacques Navy MD   Signed by:   Lamar Sprinkles, CMA on 09/01/2009   Method used:   Electronically to        [facility] [address] (retail)       [address]       [place], [place]  [address]       Ph: [phone]       Fax: [phone]   RxID:   [id number] AMLODIPINE BESYLATE 10 MG  TABS (AMLODIPINE BESYLATE) once daily  #90 x 3   Entered by:   Lamar Sprinkles, CMA   Authorized by:   Jacques Navy MD   Signed by:   Lamar Sprinkles, CMA on 09/01/2009   Method used:   Electronically to        [facility] [address] (retail)       [address]       [place], [place]  [address]       Ph: [phone]       Fax: [phone]   RxID:   [id number] LIPITOR 10 MG  TABS (ATORVASTATIN CALCIUM) q PM  #90 x 3   Entered by:    Lamar Sprinkles, CMA   Authorized by:   Jacques Navy MD   Signed by:   Lamar Sprinkles, CMA on 09/01/2009   Method used:   Electronically to        [facility] [address] (retail)       [address]       [place], [place]  [address]       Ph: [phone]       Fax: [phone]   RxID:   [id number]

## 2010-11-14 LAB — BASIC METABOLIC PANEL
CO2: 27 mEq/L (ref 19–32)
Calcium: 8.6 mg/dL (ref 8.4–10.5)
Calcium: 8.8 mg/dL (ref 8.4–10.5)
Chloride: 109 mEq/L (ref 96–112)
Creatinine, Ser: 0.72 mg/dL (ref 0.4–1.2)
GFR calc Af Amer: >60 mL/min (ref >60)
GFR calc Af Amer: >60 mL/min (ref >60)
Sodium: 142 mEq/L (ref 135–145)

## 2010-11-14 LAB — CBC
Hemoglobin: 10.5 g/dL — ABNORMAL LOW (ref 12.0–15.0)
MCHC: 34.6 g/dL (ref 30.0–36.0)
MCV: 89.9 fL (ref 78.0–100.0)
RBC: 3.36 MIL/uL — ABNORMAL LOW (ref 3.87–5.11)
RBC: 3.39 MIL/uL — ABNORMAL LOW (ref 3.87–5.11)
WBC: 8.4 10*3/uL (ref 4.0–10.5)
WBC: 9.9 10*3/uL (ref 4.0–10.5)

## 2010-11-15 LAB — DIFFERENTIAL
Eosinophils Absolute: 0 10*3/uL (ref 0.0–0.7)
Eosinophils Relative: 0 % (ref 0–5)
Lymphocytes Relative: 4 % — ABNORMAL LOW (ref 12–46)
Lymphs Abs: 0.8 10*3/uL (ref 0.7–4.0)
Monocytes Absolute: 1.5 10*3/uL — ABNORMAL HIGH (ref 0.1–1.0)

## 2010-11-15 LAB — COMPREHENSIVE METABOLIC PANEL
ALT: 16 U/L (ref 0–35)
AST: 20 U/L (ref 0–37)
Albumin: 4.4 g/dL (ref 3.5–5.2)
CO2: 28 mEq/L (ref 19–32)
Calcium: 9.6 mg/dL (ref 8.4–10.5)
Chloride: 101 mEq/L (ref 96–112)
Creatinine, Ser: 0.75 mg/dL (ref 0.4–1.2)
GFR calc Af Amer: >60 mL/min (ref >60)
Sodium: 137 mEq/L (ref 135–145)
Total Bilirubin: 0.7 mg/dL (ref 0.3–1.2)

## 2010-11-15 LAB — CBC
MCHC: 34.5 g/dL (ref 30.0–36.0)
MCV: 89.4 fL (ref 78.0–100.0)
MCV: 89.4 fL (ref 78.0–100.0)
Platelets: 290 10*3/uL (ref 150–400)
Platelets: 326 10*3/uL (ref 150–400)
RBC: 4.53 MIL/uL (ref 3.87–5.11)
RDW: 12.6 % (ref 11.5–15.5)
WBC: 17.8 10*3/uL — ABNORMAL HIGH (ref 4.0–10.5)

## 2010-11-15 LAB — URINALYSIS, ROUTINE W REFLEX MICROSCOPIC
Glucose, UA: NEGATIVE mg/dL
Protein, ur: NEGATIVE mg/dL
pH: 5 (ref 5.0–8.0)

## 2010-11-15 LAB — BASIC METABOLIC PANEL
BUN: 8 mg/dL (ref 6–23)
CO2: 29 mEq/L (ref 19–32)
Calcium: 8.9 mg/dL (ref 8.4–10.5)
Chloride: 100 mEq/L (ref 96–112)
Creatinine, Ser: 0.78 mg/dL (ref 0.4–1.2)
Glucose, Bld: 121 mg/dL — ABNORMAL HIGH (ref 70–99)

## 2010-11-15 LAB — URINE MICROSCOPIC-ADD ON

## 2010-12-22 NOTE — H&P (Signed)
NAMESABRIYA, YONO              ACCOUNT NO.:  [id number]   MEDICAL RECORD NO.:  [id number]          PATIENT TYPE:  EMS   LOCATION:  ED                           FACILITY:  [facility]   PHYSICIAN:  Lorne Skeens. Hoxworth, M.D.DATE OF BIRTH:  [birth year]   DATE OF ADMISSION:  03/07/2009  DATE OF DISCHARGE:                              HISTORY & PHYSICAL   CHIEF COMPLAINT:  Abdominal pain.   HISTORY OF PRESENT ILLNESS:  Delight Bickle is a 65 year old female who  was in her usual state of good health until yesterday when she noticed  the onset of some mild diarrhea on several occasions.  At this point,  she is not having any fever or abdominal pain.  Today however, she has  had the gradual onset of suprapubic abdominal pain.  The pain has  gradually worsened and has been intermittently severe.  No apparent  relieving or exacerbating factors.  She has also had fever up to 101.5  at home today.  She has no previous history of any similar symptoms.  She has not had any diarrhea today.  No nausea or vomiting.  She has no  urinary burning, frequency.  No cough, congestion.   PAST SURGICAL HISTORY:  Significant only for tubal ligation.   PAST SURGICAL HISTORY:  1. She is followed for elevated cholesterol.  2. Hypertension.  3. She had a colonoscopy 9 years ago showing mild diverticulosis.   MEDICATIONS:  1. Lipitor 10 mg daily.  2. Diovan HCT 10 mg daily.  3. Norvasc 10 mg daily.   ALLERGIES:  SULFA ANTIBIOTICS.   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  Married.  Drinks occasional alcohol.  Does not smoke cigarettes.   FAMILY HISTORY:  Noncontributory.   REVIEW OF SYSTEMS:  GENERAL:  Positive for fever, chills.  HEENT:  No  vision, hearing, swallowing problems.  Respiratory:  No shortness  breath, cough, wheezing.  Cardiac:  No chest pain, palpitations,  swelling.  ABDOMEN/GI:  As above.  GU:  No urinary burning, frequency.  MUSCULOSKELETAL:  No joint pain,  swelling.  NEUROLOGIC:  No numbness,  weakness, syncope.  HEMATOLOGIC:  No history of blood clots or abnormal  bleeding.   PHYSICAL EXAMINATION:  VITAL SIGNS:  Temperature is 99.4, pulse 109,  respirations 20, blood pressure 149/55.  GENERAL:  Alert female who does not appear in acute distress.  SKIN:  Warm and dry.  No rash or infection.  HEENT:  No palpable masses or thyromegaly.  Sclerae nonicteric.  Oropharynx clear.  LYMPH NODES:  No cervical, subclavicular or inguinal  nodes palpable.  LUNGS:  Clear without wheezing or increased work of breathing.  CARDIAC:  Regular rate and rhythm.  No murmurs.  No edema.  No JVD.  Peripheral pulse intact.  ABDOMEN:  Positive bowel sounds, nondistended.  There is localized left  lower quadrant and suprapubic tenderness with some guarding.  No  discernible masses or organomegaly.  No hernias.  No peritoneal signs.  EXTREMITIES:  No joint swelling or deformity.  NEUROLOGIC:  Alert, oriented.  Motor and sensory exams  are grossly  normal.   LABORATORY:  White count is elevated at 17.8 thousand, hemoglobin 13.5,  electrolytes abnormal only for potassium of 2.9.  LFTs within normal  limits.  Urinalysis unremarkable.   DIAGNOSTICS:  CT scan of the abdomen and pelvis are reviewed.  This  shows acute sigmoid diverticulitis with thickening of the colon wall and  pericolonic inflammatory change.  There is marked diverticulosis.  No  evidence of perforation or abscess.   ASSESSMENT/PLAN:  1. Acute sigmoid diverticulitis.  The patient will be admitted, placed      on bowel rest, IV fluids and broad-spectrum antibiotics.  2. Hypokalemia.  IV and p.o. replacement.      Lorne Skeens. Hoxworth, M.D.  Electronically Signed     BTH/MEDQ  D:  03/07/2009  T:  03/08/2009  Job:  [id number]   cc:   Rosalyn Gess. Norins, MD  [address]  [place]  [place] [address]

## 2010-12-25 NOTE — Assessment & Plan Note (Signed)
[facility]                             PRIMARY CARE OFFICE NOTE   NAME:Julia Sanchez, Julia Sanchez                     MRN:          [id number]  DATE:02/21/2006                            DOB:          1946-03-13    [name] is a delightful 65 year old woman who presents for general  wellness examination.  She was last seen in the office September 28, 2004.  Please see that complete dictation.   INTERVAL HISTORY:  Patient has been medically stable and well with no new  medical problems.   PAST SURGICAL HISTORY:  None.   GYNECOLOGICAL HISTORY:  She is a gravida 2, para 2.   PAST MEDICAL HISTORY:  1.  Patient had the usual childhood diseases.  2.  Hypertension.  3.  Trauma with laceration of the distal phalanx right hand index fingers      requiring minor surgery.   CURRENT MEDICATIONS:  1.  Lipitor 10 mg twice a week.  2.  Maxzide 37.5/25 once daily.  3.  Norvasc 10 mg daily.  4.  Potassium 10 mEq b.i.d.   FAMILY HISTORY:  This is well documented.   SOCIAL HISTORY:  Please see the dictation July 08, 2003.   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  Patient reports her mother died at age 75 on Dec 09, 2005, from complications of congestive heart failure.  Patient also  reports that in January of 2007, she was remarried and did spend a four-day  honeymoon at the [employer] at [place], [place].   REVIEW OF SYSTEMS:  Negative for any constitutional, cardiovascular,  respiratory, GI or GU problems.   PHYSICAL EXAMINATION:  GENERAL APPEARANCE:  Well-developed, well-nourished  woman in no acute distress.  VITAL SIGNS:  Temperature 99.1, blood pressure 161/84, pulse 102, weight  161.  HEENT:  Normocephalic and atraumatic.  EAC and TMs were normal.  Oropharynx  with native dentition in good repair. No buccal or  palatal lesions were  noted.  Posterior pharynx was clear.  Conjunctivae and sclerae was clear.  Pupils are equal, round, reactive to light and accommodation.  Funduscopic  exam revealed normal disc margins without vascular abnormality.  NECK:  Supple without thyromegaly.  NODES:  No adenopathy was noted in the cervical or supraclavicular regions.  CHEST:  No CVA tenderness.  LUNGS:  Clear to auscultation and percussion.  BREASTS:  Examination deferred to gynecology.  CARDIOVASCULAR:  There was 2+ radial pulse, no JVD, no carotid bruits.  She  had a quite precordium.  She had a regular rate and rhythm.  She had a grade  1/6 intermittent systolic murmur heard best at the left sternal border.  ABDOMEN:  Soft, no guarding or rebound, no organosplenomegaly was noted.  PELVIC AND RECTAL:  Exams deferred to gynecology.  EXTREMITIES:  Without clubbing, cyanosis, edema or deformity.  NEUROLOGIC:  Nonfocal.   LABORATORY DATA:  From Dec 23, 2005, with a serum glucose of 113, creatinine  0.8.  ASSESSMENT/PLAN:        1. Hypertension.  Patient is poorly controlled at this time.  She was           formerly on four drugs including Maxzide, Norvasc, Altace and           Toprol.  Toprol is discontinued for bradycardia.  Altace was           discontinued because of cough and palpitations.  Discussed           treatment with the patient.  At this time, we are going to give her           a trial of ARB.  Plan:  Diovan 160, four-weeks of samples provided.           Patient is to notify the office if she does well with this           medication and has good control, at which point I would switch her           to Diovan/hydrochlorothiazide at similar dose.        2. Lipids.  Patient's last LDL was well controlled at 112 on a very           low dose of Lipitor.  Discussed this with her and at this point I           have recommended a drug holiday for four months.  Will recheck her           lipids at that  time and if she needs treatment, would have her take           Lipitor on a daily basis.        3. Health maintenance.  Patient did see Dr. Marcelle Overlie in May of this           year with a normal examination and she reports she had a normal           mammogram.        4. Patient's last colonoscopy June 20, 2002, was normal with no           signs of abnormalities except diverticulosis.  Plan would be to           repeat in 10 years from her prior study.  .      SUMMARY:  A very pleasant woman who seems to be medically stable at this     time.  She will call me in regards to her blood pressure response to     Diovan.  She will return in November for repeat lipid panel.                                  Rosalyn Gess Norins, MD   MEN/MedQ  DD:  02/21/2006  DT:  02/21/2006  Job #:  [id number]

## 2011-01-18 ENCOUNTER — Other Ambulatory Visit: Payer: Self-pay | Admitting: Internal Medicine

## 2011-04-07 ENCOUNTER — Other Ambulatory Visit: Payer: Self-pay | Admitting: Internal Medicine

## 2011-04-22 ENCOUNTER — Encounter: Payer: Self-pay | Admitting: Internal Medicine

## 2011-04-23 ENCOUNTER — Encounter: Payer: Self-pay | Admitting: Internal Medicine

## 2011-04-23 ENCOUNTER — Ambulatory Visit (INDEPENDENT_AMBULATORY_CARE_PROVIDER_SITE_OTHER): Payer: Medicare Other | Admitting: Internal Medicine

## 2011-04-23 ENCOUNTER — Other Ambulatory Visit (INDEPENDENT_AMBULATORY_CARE_PROVIDER_SITE_OTHER): Payer: Medicare Other

## 2011-04-23 VITALS — BP 172/80 | HR 71 | Temp 98.4°F | Wt 165.0 lb

## 2011-04-23 DIAGNOSIS — Z0000 Encounter for general adult medical examination without abnormal findings: Secondary | ICD-10-CM

## 2011-04-23 DIAGNOSIS — E785 Hyperlipidemia, unspecified: Secondary | ICD-10-CM

## 2011-04-23 DIAGNOSIS — I10 Essential (primary) hypertension: Secondary | ICD-10-CM

## 2011-04-23 DIAGNOSIS — Z136 Encounter for screening for cardiovascular disorders: Secondary | ICD-10-CM

## 2011-04-23 DIAGNOSIS — Z23 Encounter for immunization: Secondary | ICD-10-CM

## 2011-04-23 LAB — HEPATIC FUNCTION PANEL
Albumin: 4.7 g/dL (ref 3.5–5.2)
Alkaline Phosphatase: 95 U/L (ref 39–117)
Total Protein: 7.9 g/dL (ref 6.0–8.3)

## 2011-04-23 LAB — LIPID PANEL
HDL: 67.1 mg/dL (ref >39.00)
Total CHOL/HDL Ratio: 3

## 2011-04-23 NOTE — Progress Notes (Signed)
Subjective:    Patient ID: Julia Sanchez, female    DOB: [birth year], 65 y.o.   MRN: [id number]  HPI  The patient is here for annual Medicare wellness examination and management of other chronic and acute problems. She is feeling well but she has been under a lot of stress: step-dtr getting married; son living at home after failed marriage and loss of work; mother-in-law is 2 and may need to move in. She is having right knee pain but has no limitations in her ADLs.   The risk factors are reflected in the social history.  The roster of all physicians providing medical care to patient - is listed in the Snapshot section of the chart.  Activities of daily living:  The patient is 100% inedpendent in all ADLs: dressing, toileting, feeding as well as independent mobility  Home safety : The patient has smoke detectors in the home. They wear seatbelts.  firearms are present in the home, kept in a safe fashion. There is no violence in the home.   There is no risks for hepatitis, STDs or HIV. There is no history of blood transfusion. They have no travel history to infectious disease endemic areas of the world.  The patient has seen their dentist in the last six month. They have not seen their eye doctor in the last year, pt is due and aware she needs to schedule appointment. They deny  any hearing difficulty and have not had audiologic testing in the last year.  They do not  have excessive sun exposure. Discussed the need for sun protection: hats, long sleeves and use of sunscreen if there is significant sun exposure.   Diet: the importance of a healthy diet is discussed. They do have a healthy diet.  The patient has a regular exercise program: walking , 40 minute duration, five days per week.  The benefits of regular aerobic exercise were discussed.  Depression screen: there are no signs or vegative symptoms of depression- irritability, change in appetite, anhedonia,  sadness/tearfullness.  Cognitive assessment: the patient manages all their financial and personal affairs and is actively engaged. They report no memory loss and continue to manage all household affairs and financial issues.   The following portions of the patient's history were reviewed and updated as appropriate: allergies, current medications, past family history, past medical history,  past surgical history, past social history  and problem list.  Vision, hearing, body mass index were assessed and reviewed.   During the course of the visit the patient was educated and counseled about appropriate screening and preventive services including : fall prevention , diabetes screening, nutrition counseling, colorectal cancer screening, and recommended immunizations.  Past Medical History  Diagnosis Date  . DIVERTICULOSIS, COLON 04/07/2009  . HYPERLIPIDEMIA 05/03/2007  . HYPERTENSION 05/03/2007   No past surgical history on file. Family History  Problem Relation Age of Onset  . Coronary artery disease Father     Hx MI  . Heart disease Father   . Breast cancer Maternal Aunt   . Diabetes Maternal Uncle   . Diabetes Cousin    History   Social History  . Marital Status: Married    Spouse Name: N/A    Number of Children: N/A  . Years of Education: N/A   Occupational History  . Not on file.   Social History Main Topics  . Smoking status: Never Smoker   . Smokeless tobacco: Not on file  . Alcohol Use: Yes  occassional  . Drug Use: No  . Sexually Active: Not on file   Other Topics Concern  . 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  HEENT:  Negative for hearing loss, ear pain, congestion, neck stiffness and postnasal drip. Negative for sore  throat or swallowing problems. Negative for dental complaints.   Eyes: Negative for vision loss or change in visual acuity.  Respiratory: Negative for chest tightness and wheezing.   Cardiovascular: Negative for chest pain and palpitation. No decreased exercise tolerance Gastrointestinal: No change in bowel habit. No bloating or gas. No reflux or indigestion Genitourinary: Negative for urgency, frequency, flank pain and difficulty urinating.  Musculoskeletal: Negative for myalgias, back pain, arthralgias and gait problem. Mild knee pain Neurological: Negative for dizziness, tremors, weakness and headaches.  Hematological: Negative for adenopathy.  Psychiatric/Behavioral: Negative for behavioral problems and dysphoric mood.       Objective:   Physical Exam Vitals reviewed. Gen'l: well nourished, well developed white woman in no distress HEENT - [place]/AT, EACs/TMs normal, oropharynx with native dentition in good condition, no buccal or palatal lesions, posterior pharynx clear, mucous membranes moist. C&S clear, PERRLA, fundi - normal Neck - supple, no thyromegaly Nodes- negative submental, cervical, supraclavicular regions Chest - no deformity, no CVAT Lungs - cleat without rales, wheezes. No increased work of breathing Breast - deferred Cardiovascular - regular rate and rhythm, quiet precordium, no murmurs, rubs or gallops, 2+ radial, DP and PT pulses Abdomen - BS+ x 4, no HSM, no guarding or rebound or tenderness Pelvic - deferred to gyn Rectal - deferred to gyn Extremities - no clubbing, cyanosis, edema or deformity.  Neuro - A&O x 3, CN II-XII normal, motor strength normal and equal, DTRs 2+ and symmetrical biceps, radial, and patellar tendons. Cerebellar - no tremor, no rigidity, fluid movement and normal gait. Derm - Head, neck, back, abdomen and extremities without suspicious lesions  Labs ordered an pending         Assessment & Plan:

## 2011-04-25 ENCOUNTER — Other Ambulatory Visit: Payer: Self-pay | Admitting: Internal Medicine

## 2011-04-26 DIAGNOSIS — Z0000 Encounter for general adult medical examination without abnormal findings: Secondary | ICD-10-CM | POA: Insufficient documentation

## 2011-04-26 LAB — COMPREHENSIVE METABOLIC PANEL
ALT: 14 U/L (ref 0–35)
Albumin: 4.7 g/dL (ref 3.5–5.2)
CO2: 26 mEq/L (ref 19–32)
Chloride: 101 mEq/L (ref 96–112)
GFR: 94.83 mL/min (ref >60.00)
Potassium: 3.2 mEq/L — ABNORMAL LOW (ref 3.5–5.1)
Sodium: 139 mEq/L (ref 135–145)
Total Bilirubin: 0.6 mg/dL (ref 0.3–1.2)
Total Protein: 7.9 g/dL (ref 6.0–8.3)

## 2011-04-26 NOTE — Assessment & Plan Note (Signed)
Interval medical history unremarkable - good health. Physical exam, sans breast and pelvic, is normal. Lab results are pending at this time. Breast health - last Mammogram in EMR '07. Will need current report(s) from gyn or imaging center. Last colonoscopy Nov '03 - due Nov '13. Immunizations: Tetanus March '06; Pneumonia vaccine April '10; flu shot today; candidate for shingles vaccine - she will check on coverage. 12 lead EKG normal w/o signs of ijury or ischemia.  In summary- a very nice woman who appears to be medically stable. She is encouraged to have aerobic exercise three times a week - can be a good stress reliever. She will return as needed or for general exam in one year.

## 2011-04-26 NOTE — Assessment & Plan Note (Signed)
BP Readings from Last 3 Encounters:  04/23/11 172/80  02/20/10 136/70  02/17/10 142/70   Very poor control today with previous visits revealing acceptable control.  Plan - patient to monitor BP out of the office and report back           Continue present medication.

## 2011-04-26 NOTE — Assessment & Plan Note (Signed)
Last LDL 128 in '11. New labs ordered - recommendations to follow

## 2011-05-07 ENCOUNTER — Encounter: Payer: Self-pay | Admitting: Internal Medicine

## 2011-05-31 ENCOUNTER — Other Ambulatory Visit: Payer: Self-pay | Admitting: Internal Medicine

## 2011-07-20 ENCOUNTER — Other Ambulatory Visit: Payer: Self-pay | Admitting: Internal Medicine

## 2011-10-11 ENCOUNTER — Other Ambulatory Visit: Payer: Self-pay | Admitting: *Deleted

## 2011-10-11 MED ORDER — VALSARTAN-HYDROCHLOROTHIAZIDE 160-25 MG PO TABS: ORAL_TABLET | ORAL | Status: DC

## 2011-10-11 MED ORDER — ATORVASTATIN CALCIUM 10 MG PO TABS: ORAL_TABLET | ORAL | Status: DC

## 2011-10-11 NOTE — Telephone Encounter (Signed)
Potassium Denied-last refill 21.11.12 #[id number]

## 2011-10-12 ENCOUNTER — Other Ambulatory Visit: Payer: Self-pay | Admitting: *Deleted

## 2011-10-12 MED ORDER — POTASSIUM CHLORIDE ER 10 MEQ PO TBCR: EXTENDED_RELEASE_TABLET | ORAL | Status: DC

## 2011-10-12 MED ORDER — VALSARTAN-HYDROCHLOROTHIAZIDE 160-25 MG PO TABS: ORAL_TABLET | ORAL | Status: DC

## 2011-10-12 MED ORDER — ATORVASTATIN CALCIUM 10 MG PO TABS: ORAL_TABLET | ORAL | Status: DC

## 2011-10-12 NOTE — Progress Notes (Signed)
Pt request 90-day supply; done/SLS

## 2011-10-26 ENCOUNTER — Other Ambulatory Visit: Payer: Self-pay | Admitting: Obstetrics and Gynecology

## 2012-02-23 ENCOUNTER — Other Ambulatory Visit: Payer: Self-pay | Admitting: Internal Medicine

## 2012-02-23 NOTE — Telephone Encounter (Signed)
MEDICATION REFILL TO PHARMACY, PATIENT NEED TO MAKE APPT. WITH DR. Debby Bud FOR FURTHER REFILLS

## 2012-03-29 ENCOUNTER — Other Ambulatory Visit: Payer: Self-pay | Admitting: Internal Medicine

## 2012-04-29 ENCOUNTER — Other Ambulatory Visit: Payer: Self-pay | Admitting: Internal Medicine

## 2012-05-03 ENCOUNTER — Other Ambulatory Visit: Payer: Self-pay | Admitting: *Deleted

## 2012-05-03 MED ORDER — VALSARTAN-HYDROCHLOROTHIAZIDE 160-25 MG PO TABS: 1.0000 | ORAL_TABLET | Freq: Every day | ORAL | Status: DC

## 2012-05-03 NOTE — Telephone Encounter (Signed)
REFILL OF MED SENT TO PHARMACY WITH NOTE TO MAKE APPT. FOR FURTHER REFILLS WITH DR. Debby Bud

## 2012-05-09 HISTORY — PX: EYE SURGERY: SHX253

## 2012-06-13 ENCOUNTER — Encounter: Payer: Medicare Other | Admitting: Internal Medicine

## 2012-07-03 ENCOUNTER — Other Ambulatory Visit: Payer: Self-pay | Admitting: Internal Medicine

## 2012-07-27 ENCOUNTER — Encounter: Payer: Self-pay | Admitting: Internal Medicine

## 2012-07-27 ENCOUNTER — Ambulatory Visit (INDEPENDENT_AMBULATORY_CARE_PROVIDER_SITE_OTHER): Payer: Medicare Other | Admitting: Internal Medicine

## 2012-07-27 ENCOUNTER — Other Ambulatory Visit (INDEPENDENT_AMBULATORY_CARE_PROVIDER_SITE_OTHER): Payer: Medicare Other

## 2012-07-27 VITALS — BP 158/76 | HR 81 | Temp 98.5°F | Resp 12 | Ht 66.0 in | Wt 172.0 lb

## 2012-07-27 DIAGNOSIS — E785 Hyperlipidemia, unspecified: Secondary | ICD-10-CM

## 2012-07-27 DIAGNOSIS — Z0000 Encounter for general adult medical examination without abnormal findings: Secondary | ICD-10-CM

## 2012-07-27 DIAGNOSIS — I10 Essential (primary) hypertension: Secondary | ICD-10-CM

## 2012-07-27 LAB — LIPID PANEL
Cholesterol: 198 mg/dL (ref 0–200)
LDL Cholesterol: 105 mg/dL — ABNORMAL HIGH (ref 0–99)
Total CHOL/HDL Ratio: 3
VLDL: 17 mg/dL (ref 0.0–40.0)

## 2012-07-27 LAB — HEPATIC FUNCTION PANEL
ALT: 31 U/L (ref 0–35)
Alkaline Phosphatase: 84 U/L (ref 39–117)
Bilirubin, Direct: 0 mg/dL (ref 0.0–0.3)
Total Protein: 8.2 g/dL (ref 6.0–8.3)

## 2012-07-27 LAB — COMPREHENSIVE METABOLIC PANEL
AST: 23 U/L (ref 0–37)
Albumin: 4.8 g/dL (ref 3.5–5.2)
Alkaline Phosphatase: 84 U/L (ref 39–117)
BUN: 15 mg/dL (ref 6–23)
Creatinine, Ser: 0.6 mg/dL (ref 0.4–1.2)
Potassium: 3.5 mEq/L (ref 3.5–5.1)

## 2012-07-27 NOTE — Patient Instructions (Addendum)
Thanks for coming to see me.  Your exam is good. Routine labs are ordered and you will receive a full report and you can also check on Mychart if you open an account.  Have a good holiday.

## 2012-07-27 NOTE — Progress Notes (Signed)
Subjective:    Patient ID: Julia Sanchez, female    DOB: 12/15/1945, 66 y.o.   MRN: [id number]  HPI The patient is here for annual Medicare wellness examination and management of other chronic and acute problems.  Interval - had a macular hole OS - repaired by Dr. Peggye Pitt in October '13. Has had progressive cataract.   The risk factors are reflected in the social history.  The roster of all physicians providing medical care to patient - is listed in the Snapshot section of the chart.  Activities of daily living:  The patient is 100% inedpendent in all ADLs: dressing, toileting, feeding as well as independent mobility  Home safety : The patient has smoke detectors in the home. Falls - no falls. Home is fall safe with grab bars. They wear seatbelts.  firearms are present in the home, kept in a safe fashion. There is no violence in the home.   There is no risks for hepatitis, STDs or HIV. There is no history of blood transfusion. They have no travel history to infectious disease endemic areas of the world.  The patient has seen their dentist in the last six month. They have seen their eye doctor in the last year. They deny any hearing difficulty and have not had audiologic testing in the last year.    They do not  have excessive sun exposure. Discussed the need for sun protection: hats, long sleeves and use of sunscreen if there is significant sun exposure.   Diet: the importance of a healthy diet is discussed. They do have a healthy diet.  The patient has a regular exercise program: walks , 30 duration, 3 - per week.  The benefits of regular aerobic exercise were discussed.  Depression screen: mild situation depression related to family illness - no severe symptoms.  Cognitive assessment: the patient manages all their financial and personal affairs and is actively engaged.   Past Medical History  Diagnosis Date  . DIVERTICULOSIS, COLON 04/07/2009  . HYPERLIPIDEMIA 05/03/2007  .  HYPERTENSION 05/03/2007   Past Surgical History  Procedure Date  . Eye surgery 05/2012    macular hole   Family History  Problem Relation Age of Onset  . Coronary artery disease Father     Hx MI  . Heart disease Father     CAD/MI- fatal  . Breast cancer Maternal Aunt   . Diabetes Maternal Uncle   . Diabetes Cousin    History   Social History  . Marital Status: Married    Spouse Name: N/A    Number of Children: 2  . Years of Education: 16   Occupational History  . nurse - PACU     retired   Social History Main Topics  . Smoking status: Never Smoker   . Smokeless tobacco: Never Used  . Alcohol Use: Yes     Comment: occassional  . Drug Use: No  . Sexually Active: Yes -- Female partner(s)   Other Topics Concern  . Not on file   Social History Narrative   [employer] -diploma RN. 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 Marriage is in good health. Lots of family stress.    Current Outpatient Prescriptions on File Prior to Visit  Medication Sig Dispense Refill  . amLODipine (NORVASC) 10 MG tablet Take 1 tablet (10 mg total) by mouth daily.  90 tablet  0  . atorvastatin (LIPITOR) 10 MG tablet TAKE ONE TABLET BY MOUTH ONCE DAILY IN THE EVENING  30 tablet  6  . potassium chloride (K-DUR) 10 MEQ tablet TAKE ONE TABLET BY MOUTH ONCE DAILY  90 tablet  1  . valsartan-hydrochlorothiazide (DIOVAN-HCT) 160-25 MG per tablet TAKE ONE TABLET BY MOUTH DAILY  30 tablet  0     During the course of the visit the patient was educated and counseled about appropriate screening and preventive services including : fall prevention , diabetes screening, nutrition counseling, colorectal cancer screening, and recommended immunizations.    Review of Systems Constitutional:  Negative for fever, chills, activity change and unexpected weight change.  HEENT:  Negative  for hearing loss, ear pain, congestion, neck stiffness and postnasal drip. Negative for sore throat or swallowing problems. Negative for dental complaints.   Eyes: Negative for vision loss or change in visual acuity.  Respiratory: Negative for chest tightness and wheezing. Negative for DOE.   Cardiovascular: Negative for chest pain or palpitations. No decreased exercise tolerance Gastrointestinal: No change in bowel habit. No bloating or gas. No reflux or indigestion Genitourinary: Negative for urgency, frequency, flank pain and difficulty urinating.  Musculoskeletal: Negative for myalgias, back pain, arthralgias and gait problem.  Neurological: Negative for dizziness, tremors, weakness and headaches.  Hematological: Negative for adenopathy.  Psychiatric/Behavioral: Negative for behavioral problems and dysphoric mood.       Objective:   Physical Exam Filed Vitals:   07/27/12 1325  BP: 158/76  Pulse: 81  Temp: 98.5 F (36.9 C)  Resp: 12   Wt Readings from Last 3 Encounters:  07/27/12 172 lb 0.6 oz (78.037 kg)  04/23/11 165 lb (74.844 kg)  02/20/10 172 lb (78.019 kg)   Gen'l: well nourished, well developed white Woman in no distress HEENT - [place]/AT, EACs/TMs normal, oropharynx with native dentition in good condition, no buccal or palatal lesions, posterior pharynx clear, mucous membranes moist. C&S clear, PERRLA, fundi - normal Neck - supple, no thyromegaly Nodes- negative submental, cervical, supraclavicular regions Chest - no deformity, no CVAT Lungs - clear without rales, wheezes. No increased work of breathing Breast - deferred to Gyn Cardiovascular - regular rate and rhythm, quiet precordium, no murmurs, rubs or gallops, 2+ radial, DP and PT pulses Abdomen - BS+ x 4, no HSM, no guarding or rebound or tenderness Pelvic - deferred to gyn Rectal - deferred to gyn Extremities - no clubbing, cyanosis, edema or deformity.  Neuro - A&O x 3, CN II-XII normal, motor strength normal  and equal, DTRs 2+ and symmetrical biceps, radial, and patellar tendons. Cerebellar - no tremor, no rigidity, fluid movement and normal gait. Derm - Head, neck, back, abdomen and extremities without suspicious lesions  Lab Results  Component Value Date   WBC 5.7 02/17/2010   HGB 13.2 02/17/2010   HCT 37.3 02/17/2010   PLT 341.0 02/17/2010   GLUCOSE 114* 07/27/2012   CHOL 198 07/27/2012   TRIG 85.0 07/27/2012   HDL 75.60 07/27/2012   LDLDIRECT 128.0 02/17/2010   LDLCALC 105* 07/27/2012        ALT 31 07/27/2012   AST 23 07/27/2012        NA 140 07/27/2012   K 3.5 07/27/2012   CL 100 07/27/2012   CREATININE 0.6 07/27/2012   BUN 15 07/27/2012   CO2 30 07/27/2012   TSH 1.57 04/23/2011   HGBA1C 5.8 04/19/2007           Assessment & Plan:

## 2012-07-31 ENCOUNTER — Telehealth: Payer: Self-pay | Admitting: *Deleted

## 2012-07-31 ENCOUNTER — Other Ambulatory Visit: Payer: Self-pay | Admitting: Internal Medicine

## 2012-07-31 NOTE — Telephone Encounter (Signed)
Pt called checking on the status of her labs. Told pt that results were back but Dr Debby Bud prefers to read them and then advise pt from there. Pt understood and said she would wait to hear from our office.

## 2012-08-01 NOTE — Assessment & Plan Note (Signed)
Excellent control on present medicaiton. Liver functions normal.  Plan - continue present medication

## 2012-08-01 NOTE — Assessment & Plan Note (Signed)
Interval medical history notable for ophthalmologic issues - sight in the main preserved. Otherwise, a normal medical history. Physical exam, sans breast and pelvic, normal. Labs reviewed - in normal range. She is current with colorectal and breast cancer screening ( mammography per Gyn). Immunizations current except for shingles - she is given Rx for coverage with part D.   In summary - a delightful woman who appears to be medically stable. She will contact GI for follow up colonoscopy. Regular exercise is encouraged. Return appointment in 6 months for follow up of BP if she doesn't report back sooner.

## 2012-08-01 NOTE — Assessment & Plan Note (Addendum)
BP Readings from Last 3 Encounters:  07/27/12 158/76  04/23/11 172/80  02/20/10 136/70   Sub-optimal control on present medication: CCB, ARB, Diuretic  Plan - monitor BP at home - for SBP running consistently greater than 140 will need to report back and will adjust medication - add agent

## 2012-08-09 HISTORY — PX: EYE SURGERY: SHX253

## 2012-08-25 ENCOUNTER — Other Ambulatory Visit: Payer: Self-pay | Admitting: Internal Medicine

## 2012-10-27 ENCOUNTER — Other Ambulatory Visit: Payer: Self-pay | Admitting: Internal Medicine

## 2012-11-29 ENCOUNTER — Other Ambulatory Visit: Payer: Self-pay | Admitting: Internal Medicine

## 2013-01-01 ENCOUNTER — Other Ambulatory Visit: Payer: Self-pay | Admitting: Internal Medicine

## 2013-01-30 ENCOUNTER — Other Ambulatory Visit: Payer: Self-pay | Admitting: Internal Medicine

## 2013-02-26 NOTE — H&P (Signed)
Julia Sanchez  DICTATION # [id number] CSN# [id number]   Meriel Pica, MD 02/26/2013 3:12 PM

## 2013-02-28 ENCOUNTER — Encounter (HOSPITAL_COMMUNITY)
Admission: RE | Admit: 2013-02-28 | Discharge: 2013-02-28 | Disposition: A | Discharge status: Home / Self Care | Payer: Medicare Other | Source: Ambulatory Visit | Attending: Obstetrics and Gynecology | Admitting: Obstetrics and Gynecology

## 2013-02-28 ENCOUNTER — Encounter (HOSPITAL_COMMUNITY): Payer: Self-pay

## 2013-02-28 HISTORY — DX: Anxiety disorder, unspecified: F41.9

## 2013-02-28 LAB — CBC
Platelets: 358 10*3/uL (ref 150–400)
RDW: 12.6 % (ref 11.5–15.5)
WBC: 8.5 10*3/uL (ref 4.0–10.5)

## 2013-02-28 LAB — BASIC METABOLIC PANEL
Chloride: 96 mEq/L (ref 96–112)
GFR calc Af Amer: >90 mL/min (ref >90)
GFR calc non Af Amer: 90 mL/min — ABNORMAL LOW (ref >90)
Potassium: 3.1 mEq/L — ABNORMAL LOW (ref 3.5–5.1)

## 2013-02-28 NOTE — Pre-Procedure Instructions (Signed)
Dr. Arby Barrette notified of pt's K+ result of 3.1. Surgery not until 7/29 so potential of even lower K+ is a concern. Dr. Arby Barrette wants pt to double her dose of K+ daily until surgery. I called pt and inst her.

## 2013-02-28 NOTE — Patient Instructions (Addendum)
Your procedure is scheduled on:03/06/13  Enter through the Main Entrance at :1130 am Pick up desk phone and dial [phone] and inform us of your arrival.  Please call [phone] if you have any problems the morning of surgery.  Remember: Do not eat food after midnight:MONDAY Clear liquids are ok until:9am on Tuesday   You may brush your teeth the morning of surgery.  Take these meds the morning of surgery with a sip of water: all BP meds & K+  DO NOT wear jewelry, eye make-up, lipstick,body lotion, or dark fingernail polish.  (Polished toes are ok) You may wear deodorant.   Patients discharged on the day of surgery will not be allowed to drive home. Wear loose fitting, comfortable clothes for your ride home.

## 2013-03-06 ENCOUNTER — Ambulatory Visit (HOSPITAL_COMMUNITY): Payer: Medicare Other | Admitting: Anesthesiology

## 2013-03-06 ENCOUNTER — Encounter (HOSPITAL_COMMUNITY): Payer: Self-pay | Admitting: Anesthesiology

## 2013-03-06 ENCOUNTER — Encounter (HOSPITAL_COMMUNITY): Payer: Self-pay | Admitting: *Deleted

## 2013-03-06 ENCOUNTER — Ambulatory Visit (HOSPITAL_COMMUNITY)
Admission: RE | Admit: 2013-03-06 | Discharge: 2013-03-06 | Disposition: A | Discharge status: Home / Self Care | Payer: Medicare Other | Source: Ambulatory Visit | Attending: Obstetrics and Gynecology | Admitting: Obstetrics and Gynecology

## 2013-03-06 ENCOUNTER — Encounter (HOSPITAL_COMMUNITY)
Admission: RE | Disposition: A | Discharge status: Home / Self Care | Payer: Self-pay | Source: Ambulatory Visit | Attending: Obstetrics and Gynecology

## 2013-03-06 DIAGNOSIS — Z79899 Other long term (current) drug therapy: Secondary | ICD-10-CM | POA: Insufficient documentation

## 2013-03-06 DIAGNOSIS — R87610 Atypical squamous cells of undetermined significance on cytologic smear of cervix (ASC-US): Secondary | ICD-10-CM | POA: Insufficient documentation

## 2013-03-06 DIAGNOSIS — I10 Essential (primary) hypertension: Secondary | ICD-10-CM | POA: Insufficient documentation

## 2013-03-06 HISTORY — PX: HYSTEROSCOPY W/D&C: SHX1775

## 2013-03-06 LAB — POTASSIUM: Potassium: 3.1 mEq/L — ABNORMAL LOW (ref 3.5–5.1)

## 2013-03-06 SURGERY — DILATATION AND CURETTAGE /HYSTEROSCOPY
Anesthesia: General | Wound class: Clean Contaminated

## 2013-03-06 MED ORDER — PROPOFOL 10 MG/ML IV EMUL
INTRAVENOUS | Status: AC
  Filled 2013-03-06: qty 20

## 2013-03-06 MED ORDER — FENTANYL CITRATE 0.05 MG/ML IJ SOLN
INTRAMUSCULAR | Status: DC | PRN
  Administered 2013-03-06 (×2): 50 ug via INTRAVENOUS

## 2013-03-06 MED ORDER — CEFAZOLIN SODIUM-DEXTROSE 2-3 GM-% IV SOLR
INTRAVENOUS | Status: AC
  Filled 2013-03-06: qty 50

## 2013-03-06 MED ORDER — KETOROLAC TROMETHAMINE 30 MG/ML IJ SOLN
INTRAMUSCULAR | Status: AC
  Filled 2013-03-06: qty 1

## 2013-03-06 MED ORDER — PROPOFOL 10 MG/ML IV BOLUS
INTRAVENOUS | Status: DC | PRN
  Administered 2013-03-06: 50 mg via INTRAVENOUS
  Administered 2013-03-06: 150 mg via INTRAVENOUS

## 2013-03-06 MED ORDER — GLYCINE 1.5 % IR SOLN
Status: DC | PRN
  Administered 2013-03-06: 3000 mL

## 2013-03-06 MED ORDER — MIDAZOLAM HCL 2 MG/2ML IJ SOLN
INTRAMUSCULAR | Status: AC
  Filled 2013-03-06: qty 2

## 2013-03-06 MED ORDER — KETOROLAC TROMETHAMINE 30 MG/ML IJ SOLN
INTRAMUSCULAR | Status: DC | PRN
  Administered 2013-03-06: 30 mg via INTRAVENOUS

## 2013-03-06 MED ORDER — FENTANYL CITRATE 0.05 MG/ML IJ SOLN
INTRAMUSCULAR | Status: AC
  Administered 2013-03-06: 25 ug via INTRAVENOUS
  Filled 2013-03-06: qty 2

## 2013-03-06 MED ORDER — CEFAZOLIN SODIUM-DEXTROSE 2-3 GM-% IV SOLR
2.0000 g | INTRAVENOUS | Status: AC
  Administered 2013-03-06: 2 g via INTRAVENOUS

## 2013-03-06 MED ORDER — ONDANSETRON HCL 4 MG/2ML IJ SOLN
INTRAMUSCULAR | Status: DC | PRN
  Administered 2013-03-06: 4 mg via INTRAVENOUS

## 2013-03-06 MED ORDER — DEXAMETHASONE SODIUM PHOSPHATE 10 MG/ML IJ SOLN
INTRAMUSCULAR | Status: AC
  Filled 2013-03-06: qty 1

## 2013-03-06 MED ORDER — FENTANYL CITRATE 0.05 MG/ML IJ SOLN
INTRAMUSCULAR | Status: AC
  Filled 2013-03-06: qty 2

## 2013-03-06 MED ORDER — LIDOCAINE HCL 1 % IJ SOLN
INTRAMUSCULAR | Status: DC | PRN
  Administered 2013-03-06: 8 mL

## 2013-03-06 MED ORDER — MEPERIDINE HCL 25 MG/ML IJ SOLN: 6.2500 mg | INTRAMUSCULAR | Status: DC | PRN

## 2013-03-06 MED ORDER — LIDOCAINE HCL (CARDIAC) 20 MG/ML IV SOLN
INTRAVENOUS | Status: AC
  Filled 2013-03-06: qty 5

## 2013-03-06 MED ORDER — LACTATED RINGERS IV SOLN
INTRAVENOUS | Status: DC
  Administered 2013-03-06 (×2): via INTRAVENOUS

## 2013-03-06 MED ORDER — LIDOCAINE HCL (CARDIAC) 20 MG/ML IV SOLN
INTRAVENOUS | Status: DC | PRN
  Administered 2013-03-06: 50 mg via INTRAVENOUS

## 2013-03-06 MED ORDER — ONDANSETRON HCL 4 MG/2ML IJ SOLN
INTRAMUSCULAR | Status: AC
  Filled 2013-03-06: qty 2

## 2013-03-06 MED ORDER — FERRIC SUBSULFATE SOLN
Status: DC | PRN
  Administered 2013-03-06: 1

## 2013-03-06 MED ORDER — HYDROCODONE-IBUPROFEN 7.5-200 MG PO TABS: 1.0000 | ORAL_TABLET | Freq: Three times a day (TID) | ORAL | Status: DC | PRN

## 2013-03-06 MED ORDER — METOCLOPRAMIDE HCL 5 MG/ML IJ SOLN: 10.0000 mg | Freq: Once | INTRAMUSCULAR | Status: DC | PRN

## 2013-03-06 MED ORDER — DEXAMETHASONE SODIUM PHOSPHATE 10 MG/ML IJ SOLN
INTRAMUSCULAR | Status: DC | PRN
  Administered 2013-03-06: 10 mg via INTRAVENOUS

## 2013-03-06 MED ORDER — FENTANYL CITRATE 0.05 MG/ML IJ SOLN
25.0000 ug | INTRAMUSCULAR | Status: DC | PRN
  Administered 2013-03-06: 25 ug via INTRAVENOUS

## 2013-03-06 MED ORDER — MIDAZOLAM HCL 5 MG/5ML IJ SOLN
INTRAMUSCULAR | Status: DC | PRN
  Administered 2013-03-06: 2 mg via INTRAVENOUS

## 2013-03-06 SURGICAL SUPPLY — 34 items
APPLICATOR COTTON TIP 6IN STRL (MISCELLANEOUS) ×3 IMPLANT
CANISTER SUCTION 2500CC (MISCELLANEOUS) ×3 IMPLANT
CATH ROBINSON RED A/P 16FR (CATHETERS) ×3 IMPLANT
CLOTH BEACON ORANGE TIMEOUT ST (SAFETY) ×3 IMPLANT
CONTAINER PREFILL 10% NBF 60ML (FORM) ×6 IMPLANT
DRESSING TELFA 8X3 (GAUZE/BANDAGES/DRESSINGS) ×3 IMPLANT
ELECT BALL LEEP 5MM RED (ELECTRODE) IMPLANT
ELECT LOOP LEEP RND 15X12 GRN (CUTTING LOOP)
ELECT LOOP LEEP RND 20X12 WHT (CUTTING LOOP)
ELECT REM PT RETURN 9FT ADLT (ELECTROSURGICAL) ×3
ELECTRODE LOOP LP RND 15X12GRN (CUTTING LOOP) IMPLANT
ELECTRODE LOOP LP RND 20X12WHT (CUTTING LOOP) IMPLANT
ELECTRODE REM PT RTRN 9FT ADLT (ELECTROSURGICAL) ×2 IMPLANT
EVACUATOR PREFILTER SMOKE (MISCELLANEOUS) ×3 IMPLANT
EXTENDER ELECT LOOP LEEP 10CM (CUTTING LOOP) IMPLANT
GLOVE BIO SURGEON STRL SZ7 (GLOVE) ×3 IMPLANT
GOWN STRL REIN XL XLG (GOWN DISPOSABLE) ×6 IMPLANT
HOSE NS SMOKE EVAC 7/8 X6 (MISCELLANEOUS) ×3 IMPLANT
LOOP ANGLED CUTTING 22FR (CUTTING LOOP) IMPLANT
NDL SPNL 22GX3.5 QUINCKE BK (NEEDLE) ×1 IMPLANT
NEEDLE SPNL 22GX3.5 QUINCKE BK (NEEDLE) ×3 IMPLANT
NS IRRIG 1000ML POUR BTL (IV SOLUTION) ×3 IMPLANT
PACK HYSTEROSCOPY LF (CUSTOM PROCEDURE TRAY) ×3 IMPLANT
PACK VAGINAL MINOR WOMEN LF (CUSTOM PROCEDURE TRAY) ×3 IMPLANT
PAD OB MATERNITY 4.3X12.25 (PERSONAL CARE ITEMS) ×3 IMPLANT
PENCIL BUTTON HOLSTER BLD 10FT (ELECTRODE) ×3 IMPLANT
PIPET BIOPSY ENDOMETRIAL 3MM (SUCTIONS) ×3 IMPLANT
REDUCER FITTING SMOKE EVAC (MISCELLANEOUS) ×3 IMPLANT
SCOPETTES 8  STERILE (MISCELLANEOUS) ×1
SCOPETTES 8 STERILE (MISCELLANEOUS) ×2 IMPLANT
SYR CONTROL 10ML LL (SYRINGE) ×3 IMPLANT
TOWEL OR 17X24 6PK STRL BLUE (TOWEL DISPOSABLE) ×6 IMPLANT
TUBING SMOKE EVAC HOSE ADAPTER (MISCELLANEOUS) ×3 IMPLANT
WATER STERILE IRR 1000ML POUR (IV SOLUTION) ×3 IMPLANT

## 2013-03-06 NOTE — Anesthesia Preprocedure Evaluation (Signed)
Anesthesia Evaluation  Patient identified by MRN, date of birth, ID band Patient awake    Reviewed: Allergy & Precautions, H&P , NPO status , Patient's Chart, lab work & pertinent test results  Airway Mallampati: II TM Distance: >3 FB Neck ROM: Full    Dental no notable dental hx. (+) Teeth Intact and Caps   Pulmonary neg pulmonary ROS,  breath sounds clear to auscultation  Pulmonary exam normal       Cardiovascular hypertension, Pt. on medications Rhythm:Regular Rate:Normal     Neuro/Psych negative neurological ROS  negative psych ROS   GI/Hepatic Neg liver ROS, Diverticular Disease   Endo/Other  negative endocrine ROS  Renal/GU negative Renal ROS  negative genitourinary   Musculoskeletal negative musculoskeletal ROS (+)   Abdominal   Peds  Hematology negative hematology ROS (+)   Anesthesia Other Findings   Reproductive/Obstetrics Cervical Dysplasia                           Anesthesia Physical Anesthesia Plan  ASA: II  Anesthesia Plan: General   Post-op Pain Management:    Induction:   Airway Management Planned: LMA  Additional Equipment:   Intra-op Plan:   Post-operative Plan:   Informed Consent: I have reviewed the patients History and Physical, chart, labs and discussed the procedure including the risks, benefits and alternatives for the proposed anesthesia with the patient or authorized representative who has indicated his/her understanding and acceptance.   Dental advisory given  Plan Discussed with: Anesthesiologist, CRNA and Surgeon  Anesthesia Plan Comments:         Anesthesia Quick Evaluation

## 2013-03-06 NOTE — Op Note (Signed)
Preoperative diagnosis: Persistent cervical dysplasia, ascus, cannot rule out higher grade dysplasia  Postoperative diagnosis: Same  Procedure: EUA, D&C, hysteroscopy, ectocervical biopsies  Surgeon: Marcelle Overlie  Anesthesia: Gen.  Specimens removed: 1 endometrial curettings 2 endocervical curettings 3 ectocervical biopsies  Complications: None  Procedure and findings:  Patient taken the operating room after an adequate level of general anesthesia was obtained with the patient's legs in stirrups the perineum was prepped and draped in the usual fashion for D&C. The bladder was drained. EUA was carried out at that point uterus was small midposition adnexa negative. This was done after appropriate timeout for taken. Weighted speculum was positioned, the cervix was grasped with tenaculum, paracervical block was then created by infiltrating at 3 and 9:00 submucosally, 5-7 cc of 1% plain Xylocaine at each site after negative aspiration. Cervix was somewhat deformed from atrophy and also from her prior loop excision. A small pinpoint opening was noted and small tear duct probes were used to explore it until it to be adequately dilated to allow passage of the blunt tip sound sounding the uterus to 7-8 cm along the normal tract. Continuous flow hysteroscopy was carried out revealing the fundus to look normal there was no significant polyps or abnormal tissue buildup. Ultrasound was not required as this initial dilatation portion was straightforward. Pipelle was used to perform endometrial biopsy, I could not really passed a small blunt curette due to cervical stenosis. ECC was carried out separately and then 3 limited ectocervical biopsies were performed and Monsel's applied she tolerated this well went to recovery in good condition.  Dictated with [facility]  Aroldo Galli M. Milana Obey.D.

## 2013-03-06 NOTE — Transfer of Care (Signed)
Immediate Anesthesia Transfer of Care Note  Patient: Julia Sanchez  Procedure(s) Performed: Procedure(s): DILATATION AND CURETTAGE /HYSTEROSCOPY  Patient Location: PACU  Anesthesia Type:General  Level of Consciousness: awake, alert  and oriented  Airway & Oxygen Therapy: Patient Spontanous Breathing and Patient connected to nasal cannula oxygen  Post-op Assessment: Report given to PACU RN  Post vital signs: Reviewed  Complications: No apparent anesthesia complications

## 2013-03-06 NOTE — Preoperative (Signed)
Beta Blockers   Reason not to administer Beta Blockers:Not Applicable 

## 2013-03-06 NOTE — Progress Notes (Signed)
The patient was re-examined with no change in status 

## 2013-03-06 NOTE — Anesthesia Postprocedure Evaluation (Signed)
  Anesthesia Post-op Note  Patient: Julia Sanchez  Procedure(s) Performed: Procedure(s): DILATATION AND CURETTAGE /HYSTEROSCOPY  Patient Location: PACU  Anesthesia Type:General  Level of Consciousness: awake, alert  and oriented  Airway and Oxygen Therapy: Patient Spontanous Breathing  Post-op Pain: mild  Post-op Assessment: Post-op Vital signs reviewed, Patient's Cardiovascular Status Stable, Respiratory Function Stable, Patent Airway, No signs of Nausea or vomiting and Pain level controlled  Post-op Vital Signs: Reviewed and stable  Complications: No apparent anesthesia complications

## 2013-03-14 ENCOUNTER — Other Ambulatory Visit: Payer: Self-pay

## 2013-04-04 ENCOUNTER — Other Ambulatory Visit: Payer: Self-pay | Admitting: Internal Medicine

## 2013-06-04 ENCOUNTER — Other Ambulatory Visit: Payer: Self-pay

## 2013-06-04 MED ORDER — AMLODIPINE BESYLATE 10 MG PO TABS: 10.0000 mg | ORAL_TABLET | Freq: Every day | ORAL | Status: DC

## 2013-06-12 ENCOUNTER — Other Ambulatory Visit: Payer: Self-pay

## 2013-06-12 MED ORDER — ATORVASTATIN CALCIUM 10 MG PO TABS: 10.0000 mg | ORAL_TABLET | Freq: Every day | ORAL | Status: DC

## 2013-06-14 ENCOUNTER — Other Ambulatory Visit: Payer: Self-pay

## 2013-07-06 ENCOUNTER — Encounter: Payer: Self-pay | Admitting: Internal Medicine

## 2013-07-06 DIAGNOSIS — Z1239 Encounter for other screening for malignant neoplasm of breast: Secondary | ICD-10-CM

## 2013-09-11 ENCOUNTER — Other Ambulatory Visit: Payer: Self-pay | Admitting: Internal Medicine

## 2013-09-19 DIAGNOSIS — H2510 Age-related nuclear cataract, unspecified eye: Secondary | ICD-10-CM | POA: Diagnosis not present

## 2013-09-19 DIAGNOSIS — H35349 Macular cyst, hole, or pseudohole, unspecified eye: Secondary | ICD-10-CM | POA: Diagnosis not present

## 2013-09-21 ENCOUNTER — Other Ambulatory Visit: Payer: Self-pay | Admitting: Obstetrics and Gynecology

## 2013-09-21 DIAGNOSIS — Z9189 Other specified personal risk factors, not elsewhere classified: Secondary | ICD-10-CM | POA: Diagnosis not present

## 2013-09-21 DIAGNOSIS — Z1231 Encounter for screening mammogram for malignant neoplasm of breast: Secondary | ICD-10-CM | POA: Diagnosis not present

## 2013-10-10 DIAGNOSIS — H2510 Age-related nuclear cataract, unspecified eye: Secondary | ICD-10-CM | POA: Diagnosis not present

## 2013-10-12 ENCOUNTER — Other Ambulatory Visit: Payer: Self-pay | Admitting: Internal Medicine

## 2013-10-24 ENCOUNTER — Encounter: Payer: Self-pay | Admitting: Internal Medicine

## 2013-10-24 ENCOUNTER — Other Ambulatory Visit: Payer: Self-pay

## 2013-10-24 ENCOUNTER — Other Ambulatory Visit (INDEPENDENT_AMBULATORY_CARE_PROVIDER_SITE_OTHER): Payer: Medicare Other

## 2013-10-24 ENCOUNTER — Ambulatory Visit (INDEPENDENT_AMBULATORY_CARE_PROVIDER_SITE_OTHER): Payer: Medicare Other | Admitting: Internal Medicine

## 2013-10-24 VITALS — BP 154/76 | HR 72 | Temp 97.3°F | Wt 159.0 lb

## 2013-10-24 DIAGNOSIS — Z1211 Encounter for screening for malignant neoplasm of colon: Secondary | ICD-10-CM

## 2013-10-24 DIAGNOSIS — E785 Hyperlipidemia, unspecified: Secondary | ICD-10-CM | POA: Diagnosis not present

## 2013-10-24 DIAGNOSIS — I10 Essential (primary) hypertension: Secondary | ICD-10-CM

## 2013-10-24 DIAGNOSIS — Z23 Encounter for immunization: Secondary | ICD-10-CM

## 2013-10-24 DIAGNOSIS — Z0000 Encounter for general adult medical examination without abnormal findings: Secondary | ICD-10-CM

## 2013-10-24 LAB — COMPREHENSIVE METABOLIC PANEL
ALT: 17 U/L (ref 0–35)
AST: 16 U/L (ref 0–37)
Albumin: 4.9 g/dL (ref 3.5–5.2)
Alkaline Phosphatase: 88 U/L (ref 39–117)
BILIRUBIN TOTAL: 0.7 mg/dL (ref 0.3–1.2)
BUN: 15 mg/dL (ref 6–23)
CALCIUM: 9.8 mg/dL (ref 8.4–10.5)
CHLORIDE: 98 meq/L (ref 96–112)
CO2: 28 meq/L (ref 19–32)
CREATININE: 0.7 mg/dL (ref 0.4–1.2)
GFR: 86.52 mL/min (ref >60.00)
GLUCOSE: 117 mg/dL — AB (ref 70–99)
Potassium: 3.9 mEq/L (ref 3.5–5.1)
Sodium: 136 mEq/L (ref 135–145)
Total Protein: 7.8 g/dL (ref 6.0–8.3)

## 2013-10-24 LAB — LIPID PANEL
CHOLESTEROL: 177 mg/dL (ref 0–200)
HDL: 80.6 mg/dL (ref >39.00)
LDL Cholesterol: 74 mg/dL (ref 0–99)
Total CHOL/HDL Ratio: 2
Triglycerides: 111 mg/dL (ref 0.0–149.0)
VLDL: 22.2 mg/dL (ref 0.0–40.0)

## 2013-10-24 LAB — HEMOGLOBIN AND HEMATOCRIT, BLOOD
HEMATOCRIT: 39.5 % (ref 36.0–46.0)
Hemoglobin: 13.4 g/dL (ref 12.0–15.0)

## 2013-10-24 NOTE — Progress Notes (Signed)
Pre visit review using our clinic review tool, if applicable. No additional management support is needed unless otherwise documented below in the visit note. 

## 2013-10-24 NOTE — Telephone Encounter (Signed)
Opened in error

## 2013-10-24 NOTE — Patient Instructions (Signed)
Thanks for coming in.  You had a normal exam. Labs today and results to MyChart  Immunization: Prevnar today, Pneumovax booster in 8 weeks, shingles vaccine (check coverage) 8 weeks after that.  Do get help 2 hr/day 3 days a week for your health and well-being.

## 2013-10-24 NOTE — Progress Notes (Signed)
Subjective:    Patient ID: Julia Sanchez, female    DOB: [birth year], 68 y.o.   MRN: [id number]  HPI The patient is here for annual Medicare wellness examination and management of other chronic and acute problems.  [name] presents for follow up - she hasn't  Been seen for about a year. She is feeling well.    The risk factors are reflected in the social history.  The roster of all physicians providing medical care to patient - is listed in the Snapshot section of the chart.  Activities of daily living:  The patient is 100% inedpendent in all ADLs: dressing, toileting, feeding as well as independent mobility  Home safety : The patient has smoke detectors in the home. Falls - no falls and the ome is fall safe. They wear seatbelts. firearms are present in the home, kept in a safe fashion. There is no violence in the home.   There is no risks for hepatitis, STDs or HIV. There is no   history of blood transfusion. They have no travel history to infectious disease endemic areas of the world.  The patient has seen their dentist in the last six month. They have seen their eye doctor in the last year - for cataract extract OS. They deny  any hearing difficulty and have not had audiologic testing in the last year.    They do not  have excessive sun exposure. Discussed the need for sun protection: hats, long sleeves and use of sunscreen if there is significant sun exposure.   Diet: the importance of a healthy diet is discussed. They do have a healthy diet.  The patient has a regular exercise program: walking ,  30 duration, 5 per week.  The benefits of regular aerobic exercise were discussed.  Depression screen: there are no signs or vegative symptoms of depression- irritability, change in appetite, anhedonia, sadness/tearfullness.  Cognitive assessment: the patient manages all their financial and personal affairs and is actively engaged.   The following portions of the patient's  history were reviewed and updated as appropriate: allergies, current medications, past family history, past medical history,  past surgical history, past social history  and problem list.  Vision, hearing, body mass index were assessed and reviewed.   During the course of the visit the patient was educated and counseled about appropriate screening and preventive services including : fall prevention , diabetes screening, nutrition counseling, colorectal cancer screening, and recommended immunizations.  Past Medical History  Diagnosis Date  . DIVERTICULOSIS, COLON 04/07/2009  . HYPERLIPIDEMIA 05/03/2007  . HYPERTENSION 05/03/2007  . Anxiety     due to stress   Past Surgical History  Procedure Laterality Date  . Eye surgery  05/2012    macular hole  . Cystoscopy    . Hysteroscopy w/d&c  03/06/2013    Procedure: DILATATION AND CURETTAGE /HYSTEROSCOPY;  Surgeon: Margarette Asal, MD;  Location: [facility];  Service: Gynecology;;   Family History  Problem Relation Age of Onset  . Coronary artery disease Father     Hx MI  . Heart disease Father     CAD/MI- fatal  . Breast cancer Maternal Aunt   . Diabetes Maternal Uncle   . Diabetes Cousin    History   Social History  . Marital Status: Married    Spouse Name: N/A    Number of Children: 2  . Years of Education: 16   Occupational History  . nurse - PACU  retired   Social History Main Topics  . Smoking status: Never Smoker   . Smokeless tobacco: Never Used  . Alcohol Use: Yes     Comment: occassional  . Drug Use: No  . Sexual Activity: Yes    Partners: Male   Other Topics Concern  . Not on file   Social History Narrative   [employer], [employer] -diploma RN. 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 Marriage is in good health. Lots of family stress.     Current Outpatient Prescriptions on File Prior  to Visit  Medication Sig Dispense Refill  . amLODipine (NORVASC) 10 MG tablet Take 1 tablet (10 mg total) by mouth daily.  90 tablet  3  . atorvastatin (LIPITOR) 10 MG tablet TAKE ONE TABLET BY MOUTH ONCE DAILY IN THE EVENING  30 tablet  5  . atorvastatin (LIPITOR) 10 MG tablet Take 1 tablet (10 mg total) by mouth daily at 6 PM.  30 tablet  6  . potassium chloride (K-DUR) 10 MEQ tablet TAKE ONE TABLET BY MOUTH ONCE DAILY  30 tablet  0  . valsartan-hydrochlorothiazide (DIOVAN-HCT) 160-25 MG per tablet TAKE ONE TABLET BY MOUTH DAILY  30 tablet  0  . [DISCONTINUED] potassium chloride (K-DUR,KLOR-CON) 10 MEQ tablet TAKE ONE TABLET BY MOUTH ONCE DAILY  30 tablet  4   No current facility-administered medications on file prior to visit.     Review of Systems Constitutional:  Negative for fever, chills, activity change and unexpected weight change.  HEENT:  Negative for hearing loss, ear pain, congestion, neck stiffness and postnasal drip. Negative for sore throat or swallowing problems. Negative for dental complaints.   Eyes: Negative for vision loss or change in visual acuity.  Respiratory: Negative for chest tightness and wheezing. Negative for DOE.   Cardiovascular: Negative for chest pain or palpitations. No decreased exercise tolerance Gastrointestinal: No change in bowel habit. No bloating or gas. No reflux or indigestion Genitourinary: Negative for urgency, frequency, flank pain and difficulty urinating.  Musculoskeletal: Negative for myalgias, back pain, arthralgias and gait problem.  Neurological: Negative for dizziness, tremors, weakness and headaches.  Hematological: Negative for adenopathy.  Psychiatric/Behavioral: Negative for behavioral problems and dysphoric mood.       Objective:   Physical Exam Filed Vitals:   10/24/13 1002  BP: 154/76  Pulse: 72  Temp: 97.3 F (36.3 C)   Wt Readings from Last 3 Encounters:  10/24/13 159 lb (72.122 kg)  02/28/13 170 lb (77.111 kg)    07/27/12 172 lb 0.6 oz (78.037 kg)   Gen'l: well nourished, well developed Woman in no distress HEENT - [place]/AT, EACs/TMs normal, oropharynx with native dentition in good condition, no buccal or palatal lesions, posterior pharynx clear, mucous membranes moist. C&S clear, PERRLA, fundi - normal Neck - supple, no thyromegaly Nodes- negative submental, cervical, supraclavicular regions Chest - no deformity, no CVAT Lungs - cleat without rales, wheezes. No increased work of breathing Breast - deferred Cardiovascular - regular rate and rhythm, quiet precordium, no murmurs, rubs or gallops, 2+ radial, DP and PT pulses Abdomen - BS+ x 4, no HSM, no guarding or rebound or tenderness Pelvic - deferred to gyn Rectal - deferred to gyn Extremities - no clubbing, cyanosis, edema or deformity.  Neuro - A&O x 3, CN II-XII normal, motor strength normal and equal, DTRs 2+ and symmetrical biceps, radial,  and patellar tendons. Cerebellar - no tremor, no rigidity, fluid movement and normal gait. Derm - Head, neck, back, abdomen and extremities without suspicious lesions  Recent Results (from the past 2160 hour(s))  COMPREHENSIVE METABOLIC PANEL     Status: Abnormal   Collection Time    10/24/13 11:18 AM      Result Value Ref Range   Sodium 136  135 - 145 mEq/L   Potassium 3.9  3.5 - 5.1 mEq/L   Chloride 98  96 - 112 mEq/L   CO2 28  19 - 32 mEq/L   Glucose, Bld 117 (*) 70 - 99 mg/dL   BUN 15  6 - 23 mg/dL   Creatinine, Ser 0.7  0.4 - 1.2 mg/dL   Total Bilirubin 0.7  0.3 - 1.2 mg/dL   Alkaline Phosphatase 88  39 - 117 U/L   AST 16  0 - 37 U/L   ALT 17  0 - 35 U/L   Total Protein 7.8  6.0 - 8.3 g/dL   Albumin 4.9  3.5 - 5.2 g/dL   Calcium 9.8  8.4 - 10.5 mg/dL   GFR 86.52  >60.00 mL/min  LIPID PANEL     Status: None   Collection Time    10/24/13 11:18 AM      Result Value Ref Range   Cholesterol 177  0 - 200 mg/dL   Comment: ATP III Classification       Desirable:  < 200 mg/dL                Borderline High:  200 - 239 mg/dL          High:  > = 240 mg/dL   Triglycerides 111.0  0.0 - 149.0 mg/dL   Comment: Normal:  <150 mg/dLBorderline High:  150 - 199 mg/dL   HDL 80.60  >39.00 mg/dL   VLDL 22.2  0.0 - 40.0 mg/dL   LDL Cholesterol 74  0 - 99 mg/dL   Total CHOL/HDL Ratio 2     Comment:                Men          Women1/2 Average Risk     3.4          3.3Average Risk          5.0          4.42X Average Risk          9.6          7.13X Average Risk          15.0          11.0                      HEMOGLOBIN AND HEMATOCRIT, BLOOD     Status: None   Collection Time    10/24/13 11:18 AM      Result Value Ref Range   Hemoglobin 13.4  12.0 - 15.0 g/dL   HCT 39.5  36.0 - 46.0 %        Assessment & Plan:

## 2013-10-25 ENCOUNTER — Telehealth: Payer: Self-pay | Admitting: Internal Medicine

## 2013-10-25 NOTE — Assessment & Plan Note (Signed)
Taking and tolerating low dose "statin" therapy. Lab reveals excellent control: LDL 74, HDL 81. LFTs - normal  Plan Continue low dose statin therapy

## 2013-10-25 NOTE — Assessment & Plan Note (Signed)
Interval history w/o major medical or surgical problems. 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 Physical exam, sans breast and pelvic, is normal. Lab results reviewed - in normal range. She is current for colorectal and breast cancer screening. Immunization: Prevnar today, Pneumovax booster in 8 weeks, shingles vaccine (check coverage) 8 weeks after that.  In summar A very nice woman who is medically stable. She is encouraged to arrange for a minimum of 2hrs/day 3 days a week of coverage for assist in the care of her mother in law for personal health and mental health reasons.

## 2013-10-25 NOTE — Assessment & Plan Note (Signed)
BP Readings from Last 3 Encounters:  10/24/13 154/76  03/06/13 125/63  03/06/13 125/63   Adequate control on present medications. Bmet is normal

## 2013-10-25 NOTE — Telephone Encounter (Signed)
Relevant patient education assigned to patient using Emmi. ° °

## 2013-10-30 DIAGNOSIS — H269 Unspecified cataract: Secondary | ICD-10-CM | POA: Diagnosis not present

## 2013-10-30 DIAGNOSIS — H2510 Age-related nuclear cataract, unspecified eye: Secondary | ICD-10-CM | POA: Diagnosis not present

## 2013-11-13 ENCOUNTER — Other Ambulatory Visit: Payer: Self-pay

## 2013-11-13 MED ORDER — ATORVASTATIN CALCIUM 10 MG PO TABS: 10.0000 mg | ORAL_TABLET | Freq: Every day | ORAL | Status: DC

## 2013-11-13 MED ORDER — POTASSIUM CHLORIDE ER 10 MEQ PO TBCR: 10.0000 meq | EXTENDED_RELEASE_TABLET | Freq: Every day | ORAL | Status: DC

## 2013-11-15 ENCOUNTER — Other Ambulatory Visit: Payer: Self-pay

## 2013-11-15 MED ORDER — VALSARTAN-HYDROCHLOROTHIAZIDE 160-25 MG PO TABS: 1.0000 | ORAL_TABLET | Freq: Every day | ORAL | Status: DC

## 2014-01-02 ENCOUNTER — Ambulatory Visit: Payer: Medicare Other | Admitting: *Deleted

## 2014-01-02 DIAGNOSIS — Z23 Encounter for immunization: Secondary | ICD-10-CM

## 2014-01-02 MED ORDER — PNEUMOCOCCAL VAC POLYVALENT 25 MCG/0.5ML IJ INJ: 0.5000 mL | INJECTION | INTRAMUSCULAR | Status: DC

## 2014-03-21 ENCOUNTER — Other Ambulatory Visit: Payer: Self-pay

## 2014-03-21 MED ORDER — AMLODIPINE BESYLATE 10 MG PO TABS: 10.0000 mg | ORAL_TABLET | Freq: Every day | ORAL | Status: DC

## 2014-04-02 ENCOUNTER — Other Ambulatory Visit: Payer: Self-pay | Admitting: Obstetrics and Gynecology

## 2014-04-02 DIAGNOSIS — Z9189 Other specified personal risk factors, not elsewhere classified: Secondary | ICD-10-CM | POA: Diagnosis not present

## 2014-04-03 LAB — CYTOLOGY - PAP

## 2014-04-30 DIAGNOSIS — Z01419 Encounter for gynecological examination (general) (routine) without abnormal findings: Secondary | ICD-10-CM | POA: Diagnosis not present

## 2014-04-30 LAB — CYTOLOGY - PAP

## 2014-05-08 ENCOUNTER — Encounter: Payer: Self-pay | Admitting: Gastroenterology

## 2014-06-18 NOTE — H&P (Signed)
LUN [facility]O  DICTATION # [id number] CSN# [id number]   Margarette Asal, MD 06/18/2014 8:56 AM

## 2014-06-18 NOTE — H&P (Signed)
Julia Sanchez, Julia Sanchez              ACCOUNT NO.:  [id number]  MEDICAL RECORD NO.:  [id number]  LOCATION:  [facility]                         FACILITY:  [facility]  PHYSICIAN:  Ralene Bathe. Matthew Saras, M.D.DATE OF BIRTH:  11/21/1945  DATE OF ADMISSION:  05/06/2014 DATE OF DISCHARGE:                             HISTORY & PHYSICAL   CHIEF COMPLAINT:  Persistent high-grade dysplasia.  HISTORY OF PRESENT ILLNESS:  A 68 year old postmenopausal G2, P2 patient, who has had a history over the last 5 years of borderline cervical cytology, who has undergone colposcopy and biopsy several times, most recently July 2014.  She underwent D and C, hysteroscopy with endocervical, endometrial and ectocervical biopsies.  Pathology at that time demonstrated no dysplasia.  Followup Pap in February 2015, showed low-grade dysplasia, a few cells suggestive of a higher grade lesion.  At that point, I asked Dr. Saralyn Pilar, pathology to review all the material to try to evaluate all material, to try to decipher the discrepancy.  Decision made to proceed with followup cytology which was done August 2015 that continued to show high-grade dysplasia.  We discussed a number of options, but due to the chronicity of this problem, she prefers to proceed with definitive LAVH/BSO.  This procedure including specific risks related to bleeding, infection, adjacent organ injury, transfusion, the possible need to complete the surgery abdominally, other risks related to wound infection, phlebitis discussed with her which she understands and accepts.  PAST MEDICAL HISTORY:  ALLERGIES:  To SULFA.  CURRENT MEDICATIONS:  Norvasc, Lipitor, K-Dur, Diovan/hydrochlorothiazide.  OBSTETRICAL OPERATIVE HISTORY:  She has had 2 vaginal deliveries in 89 and 67.  REVIEW OF SYSTEMS:  Significant for abnormal Pap smear, diverticulosis and hypertension.  FAMILY HISTORY:  Significant for heart disease, diabetes, hypertension and unspecified  cancer.  SOCIAL HISTORY:  Denies tobacco or drug use, she does rarely drink socially.  She is married.  Dr. Linda Hedges is listed as her medical doctor.  PHYSICAL EXAMINATION:  VITAL SIGNS:  Temp 98.2, blood pressure 140/84. HEENT:  Unremarkable. NECK:  Supple without masses. LUNGS:  Clear. CARDIOVASCULAR:  Regular rate and rhythm without murmurs, rubs, or gallops. BREASTS:  Without masses. ABDOMEN:  Soft, flat, nontender. GU:  Vulva, vagina, and cervix normal.  Uterus mid position, small adnexa negative. EXTREMITIES:  Unremarkable. NEUROLOGIC:  Unremarkable.  IMPRESSION:  Persistent high-grade dysplasia.  PLAN:  LAVH/BSO.  Procedure and risks discussed as above.     Lue [place] M. Matthew Saras, M.D.     [facility]  D:  06/18/2014  T:  06/18/2014  Job:  [id number]

## 2014-06-20 ENCOUNTER — Encounter (HOSPITAL_COMMUNITY): Payer: Self-pay

## 2014-06-20 ENCOUNTER — Encounter (HOSPITAL_COMMUNITY)
Admission: RE | Admit: 2014-06-20 | Discharge: 2014-06-20 | Disposition: A | Discharge status: Home / Self Care | Payer: Medicare Other | Source: Ambulatory Visit | Attending: Obstetrics and Gynecology | Admitting: Obstetrics and Gynecology

## 2014-06-20 ENCOUNTER — Other Ambulatory Visit: Payer: Self-pay

## 2014-06-20 DIAGNOSIS — Z01812 Encounter for preprocedural laboratory examination: Secondary | ICD-10-CM | POA: Insufficient documentation

## 2014-06-20 LAB — CBC
HCT: 38.1 % (ref 36.0–46.0)
HEMOGLOBIN: 13.2 g/dL (ref 12.0–15.0)
MCH: 31.1 pg (ref 26.0–34.0)
MCHC: 34.6 g/dL (ref 30.0–36.0)
MCV: 89.9 fL (ref 78.0–100.0)
Platelets: 277 10*3/uL (ref 150–400)
RBC: 4.24 MIL/uL (ref 3.87–5.11)
RDW: 12.8 % (ref 11.5–15.5)
WBC: 5.8 10*3/uL (ref 4.0–10.5)

## 2014-06-20 LAB — BASIC METABOLIC PANEL
ANION GAP: 13 (ref 5–15)
BUN: 14 mg/dL (ref 6–23)
CALCIUM: 9.9 mg/dL (ref 8.4–10.5)
CO2: 27 meq/L (ref 19–32)
Chloride: 99 mEq/L (ref 96–112)
Creatinine, Ser: 0.64 mg/dL (ref 0.50–1.10)
GFR calc Af Amer: >90 mL/min (ref >90)
GFR, EST NON AFRICAN AMERICAN: 88 mL/min — AB (ref >90)
GLUCOSE: 101 mg/dL — AB (ref 70–99)
POTASSIUM: 4.1 meq/L (ref 3.7–5.3)
SODIUM: 139 meq/L (ref 137–147)

## 2014-06-20 NOTE — Patient Instructions (Addendum)
   Your procedure is scheduled on:  Thursday, Nov 19th   Enter through the [employer] [facility] at: 6 AM Pick up the phone at the desk and dial [phone] and inform us of your arrival.  Please call this number if you have any problems the morning of surgery: [phone]  Remember: Do not eat or drink after midnight: Wednesday Take these medicines the morning of surgery with a SIP OF WATER:  Amlodipine and valsartan-hctz  Do not wear jewelry, make-up, or FINGER nail polish No metal in your hair or on your body. Do not wear lotions, powders, perfumes.  You may wear deodorant.  Do not bring valuables to the hospital. Contacts, dentures or bridgework may not be worn into surgery.  Leave suitcase in the car. After Surgery it may be brought to your room. For patients being admitted to the hospital, checkout time is 11:00am the day of discharge.  Home with husband "Bill"  cell [phone].

## 2014-06-24 DIAGNOSIS — N879 Dysplasia of cervix uteri, unspecified: Secondary | ICD-10-CM | POA: Diagnosis not present

## 2014-06-26 MED ORDER — DEXTROSE 5 % IV SOLN
2.0000 g | INTRAVENOUS | Status: AC
  Administered 2014-06-27: 2 g via INTRAVENOUS
  Filled 2014-06-26: qty 2

## 2014-06-27 ENCOUNTER — Ambulatory Visit (HOSPITAL_COMMUNITY): Payer: Medicare Other | Admitting: Anesthesiology

## 2014-06-27 ENCOUNTER — Encounter (HOSPITAL_COMMUNITY): Payer: Self-pay | Admitting: *Deleted

## 2014-06-27 ENCOUNTER — Observation Stay (HOSPITAL_COMMUNITY)
Admission: RE | Admit: 2014-06-27 | Discharge: 2014-06-28 | Disposition: A | Discharge status: Home / Self Care | Payer: Medicare Other | Source: Ambulatory Visit | Attending: Obstetrics and Gynecology | Admitting: Obstetrics and Gynecology

## 2014-06-27 ENCOUNTER — Encounter (HOSPITAL_COMMUNITY)
Admission: RE | Disposition: A | Discharge status: Home / Self Care | Payer: Self-pay | Source: Ambulatory Visit | Attending: Obstetrics and Gynecology

## 2014-06-27 DIAGNOSIS — D259 Leiomyoma of uterus, unspecified: Secondary | ICD-10-CM | POA: Diagnosis not present

## 2014-06-27 DIAGNOSIS — Z882 Allergy status to sulfonamides status: Secondary | ICD-10-CM | POA: Diagnosis not present

## 2014-06-27 DIAGNOSIS — E785 Hyperlipidemia, unspecified: Secondary | ICD-10-CM | POA: Insufficient documentation

## 2014-06-27 DIAGNOSIS — N893 Dysplasia of vagina, unspecified: Secondary | ICD-10-CM | POA: Diagnosis not present

## 2014-06-27 DIAGNOSIS — N879 Dysplasia of cervix uteri, unspecified: Secondary | ICD-10-CM | POA: Diagnosis present

## 2014-06-27 DIAGNOSIS — I10 Essential (primary) hypertension: Secondary | ICD-10-CM | POA: Diagnosis not present

## 2014-06-27 HISTORY — PX: SALPINGOOPHORECTOMY: SHX82

## 2014-06-27 HISTORY — PX: LAPAROSCOPIC ASSISTED VAGINAL HYSTERECTOMY: SHX5398

## 2014-06-27 SURGERY — HYSTERECTOMY, VAGINAL, LAPAROSCOPY-ASSISTED
Anesthesia: General

## 2014-06-27 MED ORDER — AMLODIPINE BESYLATE 10 MG PO TABS
10.0000 mg | ORAL_TABLET | Freq: Every day | ORAL | Status: DC
  Administered 2014-06-28: 10 mg via ORAL
  Filled 2014-06-27: qty 1

## 2014-06-27 MED ORDER — IBUPROFEN 800 MG PO TABS: 800.0000 mg | ORAL_TABLET | Freq: Three times a day (TID) | ORAL | Status: DC | PRN

## 2014-06-27 MED ORDER — DIPHENHYDRAMINE HCL 50 MG/ML IJ SOLN
INTRAMUSCULAR | Status: DC | PRN
  Administered 2014-06-27: 12.5 mg via INTRAVENOUS

## 2014-06-27 MED ORDER — LIDOCAINE HCL (CARDIAC) 20 MG/ML IV SOLN
INTRAVENOUS | Status: DC | PRN
  Administered 2014-06-27: 50 mg via INTRAVENOUS

## 2014-06-27 MED ORDER — GLYCOPYRROLATE 0.2 MG/ML IJ SOLN
INTRAMUSCULAR | Status: AC
  Filled 2014-06-27: qty 3

## 2014-06-27 MED ORDER — VALSARTAN-HYDROCHLOROTHIAZIDE 160-25 MG PO TABS: 1.0000 | ORAL_TABLET | Freq: Every day | ORAL | Status: DC

## 2014-06-27 MED ORDER — FENTANYL CITRATE 0.05 MG/ML IJ SOLN
INTRAMUSCULAR | Status: AC
  Filled 2014-06-27: qty 2

## 2014-06-27 MED ORDER — 0.9 % SODIUM CHLORIDE (POUR BTL) OPTIME
TOPICAL | Status: DC | PRN
  Administered 2014-06-27: 1000 mL

## 2014-06-27 MED ORDER — HYDROMORPHONE HCL 1 MG/ML IJ SOLN
0.2500 mg | INTRAMUSCULAR | Status: DC | PRN
  Administered 2014-06-27 (×2): 0.5 mg via INTRAVENOUS

## 2014-06-27 MED ORDER — FENTANYL CITRATE 0.05 MG/ML IJ SOLN
INTRAMUSCULAR | Status: DC | PRN
  Administered 2014-06-27: 50 ug via INTRAVENOUS
  Administered 2014-06-27 (×3): 100 ug via INTRAVENOUS

## 2014-06-27 MED ORDER — KETOROLAC TROMETHAMINE 30 MG/ML IJ SOLN: 30.0000 mg | Freq: Once | INTRAMUSCULAR | Status: DC

## 2014-06-27 MED ORDER — ROCURONIUM BROMIDE 100 MG/10ML IV SOLN
INTRAVENOUS | Status: DC | PRN
  Administered 2014-06-27: 40 mg via INTRAVENOUS

## 2014-06-27 MED ORDER — ONDANSETRON HCL 4 MG/2ML IJ SOLN: 4.0000 mg | Freq: Four times a day (QID) | INTRAMUSCULAR | Status: DC | PRN

## 2014-06-27 MED ORDER — KETOROLAC TROMETHAMINE 30 MG/ML IJ SOLN: 30.0000 mg | Freq: Four times a day (QID) | INTRAMUSCULAR | Status: DC

## 2014-06-27 MED ORDER — PROPOFOL 10 MG/ML IV BOLUS
INTRAVENOUS | Status: DC | PRN
  Administered 2014-06-27: 150 mg via INTRAVENOUS

## 2014-06-27 MED ORDER — BUPIVACAINE HCL (PF) 0.25 % IJ SOLN
INTRAMUSCULAR | Status: AC
  Filled 2014-06-27: qty 30

## 2014-06-27 MED ORDER — BUPIVACAINE HCL (PF) 0.25 % IJ SOLN
INTRAMUSCULAR | Status: DC | PRN
  Administered 2014-06-27: 7 mL

## 2014-06-27 MED ORDER — DEXAMETHASONE SODIUM PHOSPHATE 10 MG/ML IJ SOLN
INTRAMUSCULAR | Status: DC | PRN
  Administered 2014-06-27: 4 mg via INTRAVENOUS

## 2014-06-27 MED ORDER — ROCURONIUM BROMIDE 100 MG/10ML IV SOLN
INTRAVENOUS | Status: AC
  Filled 2014-06-27: qty 1

## 2014-06-27 MED ORDER — FENTANYL CITRATE 0.05 MG/ML IJ SOLN
INTRAMUSCULAR | Status: AC
  Filled 2014-06-27: qty 5

## 2014-06-27 MED ORDER — POTASSIUM CHLORIDE ER 10 MEQ PO TBCR
10.0000 meq | EXTENDED_RELEASE_TABLET | Freq: Every day | ORAL | Status: DC
  Administered 2014-06-27 – 2014-06-28 (×2): 10 meq via ORAL
  Filled 2014-06-27 (×2): qty 1

## 2014-06-27 MED ORDER — HYDROMORPHONE HCL 1 MG/ML IJ SOLN
INTRAMUSCULAR | Status: AC
  Filled 2014-06-27: qty 1

## 2014-06-27 MED ORDER — MORPHINE SULFATE (PF) 1 MG/ML IV SOLN
INTRAVENOUS | Status: DC
  Administered 2014-06-27: 8 mL via INTRAVENOUS
  Administered 2014-06-27: 4 mL via INTRAVENOUS
  Administered 2014-06-27: 11:00:00 via INTRAVENOUS
  Administered 2014-06-27: 13 mg via INTRAVENOUS
  Filled 2014-06-27: qty 25

## 2014-06-27 MED ORDER — INFLUENZA VAC SPLIT QUAD 0.5 ML IM SUSY
0.5000 mL | PREFILLED_SYRINGE | INTRAMUSCULAR | Status: AC
  Administered 2014-06-28: 0.5 mL via INTRAMUSCULAR
  Filled 2014-06-27: qty 0.5

## 2014-06-27 MED ORDER — GLYCOPYRROLATE 0.2 MG/ML IJ SOLN
INTRAMUSCULAR | Status: DC | PRN
  Administered 2014-06-27: 0.4 mg via INTRAVENOUS
  Administered 2014-06-27: 0.2 mg via INTRAVENOUS

## 2014-06-27 MED ORDER — ATORVASTATIN CALCIUM 10 MG PO TABS
10.0000 mg | ORAL_TABLET | Freq: Every day | ORAL | Status: DC
  Administered 2014-06-27: 10 mg via ORAL
  Filled 2014-06-27: qty 1

## 2014-06-27 MED ORDER — IRBESARTAN 150 MG PO TABS
150.0000 mg | ORAL_TABLET | Freq: Every day | ORAL | Status: DC
  Administered 2014-06-28: 150 mg via ORAL
  Filled 2014-06-27: qty 1

## 2014-06-27 MED ORDER — DIPHENHYDRAMINE HCL 12.5 MG/5ML PO ELIX: 12.5000 mg | ORAL_SOLUTION | Freq: Four times a day (QID) | ORAL | Status: DC | PRN

## 2014-06-27 MED ORDER — HYDROMORPHONE HCL 1 MG/ML IJ SOLN
INTRAMUSCULAR | Status: DC | PRN
  Administered 2014-06-27: 1 mg via INTRAVENOUS

## 2014-06-27 MED ORDER — KETOROLAC TROMETHAMINE 30 MG/ML IJ SOLN
INTRAMUSCULAR | Status: DC | PRN
  Administered 2014-06-27: 15 mg via INTRAVENOUS

## 2014-06-27 MED ORDER — BUTORPHANOL TARTRATE 1 MG/ML IJ SOLN: 1.0000 mg | INTRAMUSCULAR | Status: DC | PRN

## 2014-06-27 MED ORDER — KETOROLAC TROMETHAMINE 15 MG/ML IJ SOLN
15.0000 mg | Freq: Four times a day (QID) | INTRAMUSCULAR | Status: DC
  Administered 2014-06-27 – 2014-06-28 (×3): 15 mg via INTRAVENOUS
  Filled 2014-06-27 (×4): qty 1

## 2014-06-27 MED ORDER — LACTATED RINGERS IV SOLN
INTRAVENOUS | Status: DC
  Administered 2014-06-27 (×2): via INTRAVENOUS

## 2014-06-27 MED ORDER — FENTANYL CITRATE 0.05 MG/ML IJ SOLN
25.0000 ug | INTRAMUSCULAR | Status: DC | PRN
  Administered 2014-06-27 (×3): 25 ug via INTRAVENOUS

## 2014-06-27 MED ORDER — PHENYLEPHRINE HCL 10 MG/ML IJ SOLN
10.0000 mg | INTRAVENOUS | Status: DC | PRN
  Administered 2014-06-27: 40 ug/min via INTRAVENOUS

## 2014-06-27 MED ORDER — DIPHENHYDRAMINE HCL 50 MG/ML IJ SOLN
INTRAMUSCULAR | Status: AC
  Filled 2014-06-27: qty 1

## 2014-06-27 MED ORDER — DEXTROSE IN LACTATED RINGERS 5 % IV SOLN
INTRAVENOUS | Status: DC
  Administered 2014-06-27 – 2014-06-28 (×3): via INTRAVENOUS

## 2014-06-27 MED ORDER — ONDANSETRON HCL 4 MG PO TABS: 4.0000 mg | ORAL_TABLET | Freq: Four times a day (QID) | ORAL | Status: DC | PRN

## 2014-06-27 MED ORDER — NALOXONE HCL 0.4 MG/ML IJ SOLN: 0.4000 mg | INTRAMUSCULAR | Status: DC | PRN

## 2014-06-27 MED ORDER — LIDOCAINE HCL (CARDIAC) 20 MG/ML IV SOLN
INTRAVENOUS | Status: AC
  Filled 2014-06-27: qty 5

## 2014-06-27 MED ORDER — MENTHOL 3 MG MT LOZG: 1.0000 | LOZENGE | OROMUCOSAL | Status: DC | PRN

## 2014-06-27 MED ORDER — NEOSTIGMINE METHYLSULFATE 10 MG/10ML IV SOLN
INTRAVENOUS | Status: AC
  Filled 2014-06-27: qty 1

## 2014-06-27 MED ORDER — OXYCODONE-ACETAMINOPHEN 5-325 MG PO TABS
1.0000 | ORAL_TABLET | ORAL | Status: DC | PRN
  Administered 2014-06-27: 1 via ORAL
  Filled 2014-06-27: qty 1

## 2014-06-27 MED ORDER — DIPHENHYDRAMINE HCL 50 MG/ML IJ SOLN: 12.5000 mg | Freq: Four times a day (QID) | INTRAMUSCULAR | Status: DC | PRN

## 2014-06-27 MED ORDER — SODIUM CHLORIDE 0.9 % IJ SOLN: 9.0000 mL | INTRAMUSCULAR | Status: DC | PRN

## 2014-06-27 MED ORDER — ONDANSETRON HCL 4 MG/2ML IJ SOLN
INTRAMUSCULAR | Status: AC
  Filled 2014-06-27: qty 2

## 2014-06-27 MED ORDER — HYDROMORPHONE HCL 1 MG/ML IJ SOLN
INTRAMUSCULAR | Status: AC
Start: 2014-06-27 — End: 2014-06-27
  Filled 2014-06-27: qty 1

## 2014-06-27 MED ORDER — PHENYLEPHRINE HCL 10 MG/ML IJ SOLN
INTRAMUSCULAR | Status: AC
  Filled 2014-06-27: qty 1

## 2014-06-27 MED ORDER — ONDANSETRON HCL 4 MG/2ML IJ SOLN
INTRAMUSCULAR | Status: DC | PRN
  Administered 2014-06-27: 4 mg via INTRAVENOUS

## 2014-06-27 MED ORDER — KETOROLAC TROMETHAMINE 15 MG/ML IJ SOLN
15.0000 mg | Freq: Four times a day (QID) | INTRAMUSCULAR | Status: DC
  Filled 2014-06-27 (×4): qty 1

## 2014-06-27 MED ORDER — SCOPOLAMINE 1 MG/3DAYS TD PT72: 1.0000 | MEDICATED_PATCH | Freq: Once | TRANSDERMAL | Status: DC

## 2014-06-27 MED ORDER — PROPOFOL 10 MG/ML IV EMUL
INTRAVENOUS | Status: AC
  Filled 2014-06-27: qty 20

## 2014-06-27 MED ORDER — HYDROCHLOROTHIAZIDE 25 MG PO TABS
25.0000 mg | ORAL_TABLET | Freq: Every day | ORAL | Status: DC
  Administered 2014-06-28: 25 mg via ORAL
  Filled 2014-06-27: qty 1

## 2014-06-27 MED ORDER — MIDAZOLAM HCL 2 MG/2ML IJ SOLN: INTRAMUSCULAR | Status: DC | PRN

## 2014-06-27 MED ORDER — DEXAMETHASONE SODIUM PHOSPHATE 4 MG/ML IJ SOLN
INTRAMUSCULAR | Status: AC
  Filled 2014-06-27: qty 1

## 2014-06-27 MED ORDER — NEOSTIGMINE METHYLSULFATE 10 MG/10ML IV SOLN
INTRAVENOUS | Status: DC | PRN
Start: 2014-06-27 — End: 2014-06-27
  Administered 2014-06-27: 2.5 mg via INTRAVENOUS

## 2014-06-27 SURGICAL SUPPLY — 43 items
BLADE SURG 10 STRL SS (BLADE) ×4 IMPLANT
BLADE SURG 11 STRL SS (BLADE) ×8 IMPLANT
CABLE HIGH FREQUENCY MONO STRZ (ELECTRODE) IMPLANT
CATH ROBINSON RED A/P 16FR (CATHETERS) ×2 IMPLANT
CLOTH BEACON ORANGE TIMEOUT ST (SAFETY) ×4 IMPLANT
CONT PATH 16OZ SNAP LID 3702 (MISCELLANEOUS) ×4 IMPLANT
COVER BACK TABLE 60X90IN (DRAPES) ×4 IMPLANT
DECANTER SPIKE VIAL GLASS SM (MISCELLANEOUS) IMPLANT
DRESSING OPSITE X SMALL 2X3 (GAUZE/BANDAGES/DRESSINGS) ×3 IMPLANT
DRSG COVADERM PLUS 2X2 (GAUZE/BANDAGES/DRESSINGS) ×8 IMPLANT
DRSG OPSITE POSTOP 3X4 (GAUZE/BANDAGES/DRESSINGS) IMPLANT
DURAPREP 26ML APPLICATOR (WOUND CARE) ×4 IMPLANT
ELECT LIGASURE SHORT 9 REUSE (ELECTRODE) ×4 IMPLANT
ELECT REM PT RETURN 9FT ADLT (ELECTROSURGICAL) ×4
ELECTRODE REM PT RTRN 9FT ADLT (ELECTROSURGICAL) ×2 IMPLANT
GLOVE BIO SURGEON STRL SZ7 (GLOVE) ×8 IMPLANT
GLOVE BIOGEL PI IND STRL 6.5 (GLOVE) ×2 IMPLANT
GLOVE BIOGEL PI IND STRL 7.0 (GLOVE) ×4 IMPLANT
GLOVE BIOGEL PI INDICATOR 6.5 (GLOVE) ×2
GLOVE BIOGEL PI INDICATOR 7.0 (GLOVE) ×4
GOWN STRL REUS W/ TWL LRG LVL3 (GOWN DISPOSABLE) ×14 IMPLANT
GOWN STRL REUS W/TWL LRG LVL3 (GOWN DISPOSABLE) ×28
LEGGING LITHOTOMY PAIR STRL (DRAPES) ×3 IMPLANT
LIQUID BAND (GAUZE/BANDAGES/DRESSINGS) ×8 IMPLANT
NEEDLE INSUFFLATION 120MM (ENDOMECHANICALS) ×4 IMPLANT
NS IRRIG 1000ML POUR BTL (IV SOLUTION) ×6 IMPLANT
PACK LAVH (CUSTOM PROCEDURE TRAY) ×4 IMPLANT
PAD TRENDELENBURG OR TABLE (MISCELLANEOUS) ×4 IMPLANT
PROTECTOR NERVE ULNAR (MISCELLANEOUS) ×4 IMPLANT
SEALER TISSUE G2 CVD JAW 45CM (ENDOMECHANICALS) ×4 IMPLANT
SET IRRIG TUBING LAPAROSCOPIC (IRRIGATION / IRRIGATOR) IMPLANT
SUT MON AB 2-0 CT1 36 (SUTURE) ×8 IMPLANT
SUT VIC AB 0 CT1 18XCR BRD8 (SUTURE) ×4 IMPLANT
SUT VIC AB 0 CT1 36 (SUTURE) ×4 IMPLANT
SUT VIC AB 0 CT1 8-18 (SUTURE) ×4
SUT VICRYL 0 TIES 12 18 (SUTURE) ×4 IMPLANT
SUT VICRYL 4-0 PS2 18IN ABS (SUTURE) ×4 IMPLANT
TOWEL OR 17X24 6PK STRL BLUE (TOWEL DISPOSABLE) ×8 IMPLANT
TRAY FOLEY CATH 14FR (SET/KITS/TRAYS/PACK) ×4 IMPLANT
TROCAR OPTI TIP 5M 100M (ENDOMECHANICALS) ×4 IMPLANT
TROCAR XCEL DIL TIP R 11M (ENDOMECHANICALS) ×4 IMPLANT
WARMER LAPAROSCOPE (MISCELLANEOUS) ×4 IMPLANT
WATER STERILE IRR 1000ML POUR (IV SOLUTION) ×2 IMPLANT

## 2014-06-27 NOTE — Transfer of Care (Signed)
Immediate Anesthesia Transfer of Care Note  Patient: Julia Sanchez  Procedure(s) Performed: Procedure(s): LAPAROSCOPIC ASSISTED VAGINAL HYSTERECTOMY WITH BILATERAL SALPINGO OOPHORECTOMY (N/A) SALPINGO OOPHORECTOMY (Bilateral)  Patient Location: PACU  Anesthesia Type:General  Level of Consciousness: awake, alert  and oriented  Airway & Oxygen Therapy: Patient Spontanous Breathing and Patient connected to nasal cannula oxygen  Post-op Assessment: Report given to PACU RN and Post -op Vital signs reviewed and stable  Post vital signs: Reviewed and stable  Complications: No apparent anesthesia complications

## 2014-06-27 NOTE — Op Note (Signed)
Preoperative diagnosis: Persistent/recurrent dysplasia  Postoperative diagnosis: Same  Procedure: LAVH BSO  Surgeon: Matthew Saras  Asst.: McComb  EBL: 1 50 cc  Specimens removed: Uterus, bilateral tubes and ovaries  Procedure and findings:  The patient was taken the operating room after an adequate level of general anesthesia was obtained with the patient's legs in stirrups the abdomen perineum and vagina were prepped and draped in usual fashion for LAVH. Appropriate timeout for taken at that point. The bladder was drained, EUA carried out, uterus small midposition adnexa negative. Conn cannula was positioned. Attention directed to the abdomen  The subumbilical area was infiltrated with quarter percent Marcaine plain small incision was made in the varies needle was introduced without difficulty. Its intra-abdominal position was verified by pressure water testing. After a 2-1/2 L pneumoperitoneum was then created, laparoscopic trocar and sleeve were then introduced that difficulty. There was no evidence of any bleeding or trauma. 3 finger breaths above the symphysis in the midline a 5 mm trocar was inserted under direct visualization, blunt probe was positioned patient placed in Trendelenburg pelvic findings as follows:  The uterus was small mobile adnexa unremarkable cul-de-sac free and clear upper abdomen negative a grasping device was then used to grasp the distal right tube and ovary was placed on traction toward the midline the course of the ureter was noted to be well below. The Enseal device was then used to coagulate and divide the IP ligament down to and including the round ligament on the right side. The exact same was repeated on the left side. These areas were hemostatic once this was accomplished the vaginal portion the procedure was started.  The legs were extended weighted speculum was positioned the cervical vaginal mucosa was circumscribed, posterior culdotomy performed without  difficulty. The bladder was advanced superiorly with sharp and blunt dissection until the anterior peritoneal reflection could be identified. This was entered sharply and a retractor then used to gently elevate the bladder out of the field. Using the LigaSure device the uterosacral ligament, cardinal ligament, uterine vasculature pelvic: Upper broad ligament pedicles were coagulated and divided. Specimen was removed. Prior to closure, sponge, needle, history counts reported as correct 2. The posterior vaginal cuff was closed from 3 to 9:00 with a running locked 2-0 Vicryl suture. 2-0 Monocryl interrupted sutures were then used to and right to left fashion to reapproximate the vaginal mucosa. This was hemostatic Foley catheter positioned draining clear urine.  Repeat laparoscopy carried out at that point the operative sites were inspected and noted be hemostatic even with reduced pressure. Instruments removed, gas allowed to escape, the umbilical incision closed with a subarticular closure Dermabond on the suprapubic she tolerated this well went to recovery room in good condition.  Dictated with OrrickD.

## 2014-06-27 NOTE — Progress Notes (Signed)
The patient was re-examined with no change in status 

## 2014-06-27 NOTE — Anesthesia Postprocedure Evaluation (Signed)
  Anesthesia Post-op Note  Patient: Julia Sanchez  Procedure(s) Performed: Procedure(s): LAPAROSCOPIC ASSISTED VAGINAL HYSTERECTOMY WITH BILATERAL SALPINGO OOPHORECTOMY (N/A) SALPINGO OOPHORECTOMY (Bilateral)  Patient Location: PACU  Anesthesia Type:General  Level of Consciousness: awake, alert  and oriented  Airway and Oxygen Therapy: Patient Spontanous Breathing  Post-op Pain: mild  Post-op Assessment: Post-op Vital signs reviewed, Patient's Cardiovascular Status Stable, Respiratory Function Stable, Patent Airway, No signs of Nausea or vomiting and Pain level controlled  Post-op Vital Signs: Reviewed and stable  Last Vitals:  Filed Vitals:   06/27/14 0945  BP: 117/46  Pulse: 64  Temp:   Resp: 15    Complications: No apparent anesthesia complications

## 2014-06-27 NOTE — Anesthesia Preprocedure Evaluation (Signed)
Anesthesia Evaluation  Patient identified by MRN, date of birth, ID band Patient awake    Reviewed: Allergy & Precautions, H&P , Patient's Chart, lab work & pertinent test results, reviewed documented beta blocker date and time   Airway Mallampati: II  TM Distance: >3 FB Neck ROM: full    Dental no notable dental hx.    Pulmonary  breath sounds clear to auscultation  Pulmonary exam normal       Cardiovascular hypertension (well controlled), On Medications Rhythm:regular Rate:Normal     Neuro/Psych    GI/Hepatic   Endo/Other    Renal/GU      Musculoskeletal   Abdominal   Peds  Hematology   Anesthesia Other Findings   Reproductive/Obstetrics                             Anesthesia Physical Anesthesia Plan  ASA: II  Anesthesia Plan: General   Post-op Pain Management:    Induction: Intravenous  Airway Management Planned: Oral ETT  Additional Equipment:   Intra-op Plan:   Post-operative Plan: Extubation in OR  Informed Consent: I have reviewed the patients History and Physical, chart, labs and discussed the procedure including the risks, benefits and alternatives for the proposed anesthesia with the patient or authorized representative who has indicated his/her understanding and acceptance.   Dental Advisory Given and Dental advisory given  Plan Discussed with: CRNA and Surgeon  Anesthesia Plan Comments: (  Discussed general anesthesia, including possible nausea, instrumentation of airway, sore throat,pulmonary aspiration, etc. I asked if the were any outstanding questions, or  concerns before we proceeded. )        Anesthesia Quick Evaluation

## 2014-06-27 NOTE — Anesthesia Postprocedure Evaluation (Signed)
  Anesthesia Post-op Note  Patient: Julia Sanchez  Procedure(s) Performed: Procedure(s): LAPAROSCOPIC ASSISTED VAGINAL HYSTERECTOMY WITH BILATERAL SALPINGO OOPHORECTOMY (N/A) SALPINGO OOPHORECTOMY (Bilateral)  Patient Location: [facility]  Anesthesia Type:General  Level of Consciousness: awake, alert , oriented and patient cooperative  Airway and Oxygen Therapy: Patient Spontanous Breathing and Patient connected to nasal cannula oxygen  Post-op Pain: mild  Post-op Assessment: Post-op Vital signs reviewed, Patient's Cardiovascular Status Stable, Respiratory Function Stable, Patent Airway and No signs of Nausea or vomiting  Post-op Vital Signs: Reviewed and stable  Last Vitals:  Filed Vitals:   06/27/14 1330  BP: 136/56  Pulse: 71  Temp:   Resp: 16    Complications: No apparent anesthesia complications

## 2014-06-27 NOTE — Plan of Care (Signed)
Problem: Phase I Progression Outcomes Goal: Dangle/OOB as tolerated per MD order Outcome: Completed/Met Date Met:  06/27/14     

## 2014-06-27 NOTE — Addendum Note (Signed)
Addendum  created 06/27/14 1405 by Raenette Rover, CRNA   Modules edited: Notes Section   Notes Section:  File: [id number]

## 2014-06-27 NOTE — Plan of Care (Signed)
Problem: Phase I Progression Outcomes Goal: IS, TCDB as ordered Outcome: Progressing

## 2014-06-27 NOTE — Progress Notes (Signed)
   06/27/14 1400  Clinical Encounter Type  Visited With Patient  Visit Type Spiritual support;Social support;Post-op  Referral From Nurse Vonzella Nipple, RN)  Spiritual Encounters  Spiritual Needs Emotional  Stress Factors  Patient Stress Factors (family caregiving)   Julia Sanchez, a retired Therapist, sports from [employer], was in good spirits during this visit, which she used well to share and process significant parts of her life.  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  Julia Sanchez uses gratitude, perspective, and faith to cope, trying to notice and savor as much as she can in life.  She reports support from family, friends, and church.  Provided pastoral presence, reflective listening, and spiritual companionship as she worked through Chief Technology Officer, hopes, and plans.  She expressed appreciation for the opportunity to reflect with a supportive, neutral care provider.    Angie, [place]

## 2014-06-27 NOTE — Anesthesia Procedure Notes (Signed)
Date/Time: 06/27/2014 7:31 AM Performed by: Jonna Munro Pre-anesthesia Checklist: Patient identified, Emergency Drugs available, Suction available, Timeout performed and Patient being monitored Patient Re-evaluated:Patient Re-evaluated prior to inductionOxygen Delivery Method: Circle system utilized Preoxygenation: Pre-oxygenation with 100% oxygen Intubation Type: IV induction Ventilation: Mask ventilation without difficulty Grade View: Grade I Tube type: Oral Tube size: 7.0 mm Number of attempts: 1 Airway Equipment and Method: Stylet Placement Confirmation: ETT inserted through vocal cords under direct vision,  positive ETCO2 and breath sounds checked- equal and bilateral Secured at: 20 cm Tube secured with: Tape Dental Injury: Teeth and Oropharynx as per pre-operative assessment

## 2014-06-28 ENCOUNTER — Encounter (HOSPITAL_COMMUNITY): Payer: Self-pay | Admitting: Obstetrics and Gynecology

## 2014-06-28 DIAGNOSIS — D259 Leiomyoma of uterus, unspecified: Secondary | ICD-10-CM | POA: Diagnosis not present

## 2014-06-28 LAB — CBC
HCT: 31 % — ABNORMAL LOW (ref 36.0–46.0)
HEMOGLOBIN: 10.7 g/dL — AB (ref 12.0–15.0)
MCH: 31.4 pg (ref 26.0–34.0)
MCHC: 34.5 g/dL (ref 30.0–36.0)
MCV: 90.9 fL (ref 78.0–100.0)
PLATELETS: 239 10*3/uL (ref 150–400)
RBC: 3.41 MIL/uL — AB (ref 3.87–5.11)
RDW: 12.8 % (ref 11.5–15.5)
WBC: 11.4 10*3/uL — AB (ref 4.0–10.5)

## 2014-06-28 MED ORDER — OXYCODONE-ACETAMINOPHEN 5-325 MG PO TABS: 1.0000 | ORAL_TABLET | ORAL | Status: DC | PRN

## 2014-06-28 MED ORDER — IBUPROFEN 800 MG PO TABS: 800.0000 mg | ORAL_TABLET | Freq: Three times a day (TID) | ORAL | Status: DC | PRN

## 2014-06-28 NOTE — Plan of Care (Signed)
Problem: Phase I Progression Outcomes Goal: Pain controlled with appropriate interventions Outcome: Completed/Met Date Met:  06/28/14 Goal: Admission history reviewed Outcome: Completed/Met Date Met:  06/28/14 Goal: VS, stable, temp < 100.4 degrees F Outcome: Completed/Met Date Met:  06/28/14 Goal: I & O every 4 hrs or as ordered Outcome: Completed/Met Date Met:  06/28/14 Goal: IS, TCDB as ordered Outcome: Completed/Met Date Met:  06/28/14  Problem: Phase II Progression Outcomes Goal: Pain controlled on oral analgesia Outcome: Completed/Met Date Met:  06/28/14  Problem: Discharge Progression Outcomes Goal: Tolerating diet Outcome: Completed/Met Date Met:  06/28/14

## 2014-06-28 NOTE — Discharge Summary (Signed)
Physician Discharge Summary  Patient ID: Julia Sanchez MRN: [id number] DOB/AGE: [birth year] 70 y.o.  Admit date: 06/27/2014 Discharge date: 06/28/2014  Admission Diagnoses:persistent dysplasia  Discharge Diagnoses: same, LAVHBSO Active Problems:   Dysplasia of cervix   Discharged Condition: good  Hospital Course: adm for LAVH BSO, D/C POD #1, afeb, tol PO, abd soft +BS  Consults: None  Significant Diagnostic Studies:  CBC    Component Value Date/Time   WBC 11.4* 06/28/2014 0532   RBC 3.41* 06/28/2014 0532   HGB 10.7* 06/28/2014 0532   HCT 31.0* 06/28/2014 0532   PLT 239 06/28/2014 0532   MCV 90.9 06/28/2014 0532   MCH 31.4 06/28/2014 0532   MCHC 34.5 06/28/2014 0532   RDW 12.8 06/28/2014 0532   LYMPHSABS 1.2 02/17/2010 0935   MONOABS 0.5 02/17/2010 0935   EOSABS 0.2 02/17/2010 0935   BASOSABS 0.0 02/17/2010 0935      Treatments: surgery: LAVH BSO  Discharge Exam: Blood pressure 100/44, pulse 66, temperature 98.2 F (36.8 C), temperature source Oral, resp. rate 14, height 5\' 6"  (1.676 m), weight 155 lb (70.308 kg), SpO2 98 %. abd soft + BS, Incs  C/D  Disposition: 01-Home or Self Care     Medication List    TAKE these medications        amLODipine 10 MG tablet  Commonly known as:  NORVASC  Take 1 tablet (10 mg total) by mouth daily.     atorvastatin 10 MG tablet  Commonly known as:  LIPITOR  TAKE ONE TABLET BY MOUTH ONCE DAILY IN THE EVENING     atorvastatin 10 MG tablet  Commonly known as:  LIPITOR  Take 1 tablet (10 mg total) by mouth daily at 6 PM.     ibuprofen 800 MG tablet  Commonly known as:  ADVIL,MOTRIN  Take 1 tablet (800 mg total) by mouth every 8 (eight) hours as needed for moderate pain (mild pain).     oxyCODONE-acetaminophen 5-325 MG per tablet  Commonly known as:  PERCOCET/ROXICET  Take 1-2 tablets by mouth every 4 (four) hours as needed for severe pain (moderate to severe pain (when tolerating fluids)).     potassium  chloride 10 MEQ tablet  Commonly known as:  K-DUR  Take 1 tablet (10 mEq total) by mouth daily.     valsartan-hydrochlorothiazide 160-25 MG per tablet  Commonly known as:  DIOVAN-HCT  Take 1 tablet by mouth daily.           Follow-up Information    Follow up with Margarette Asal, MD. Schedule an appointment as soon as possible for a visit in 10 days.   Specialty:  Obstetrics and Gynecology   Contact information:   [address] [place] [address] [phone]       Signed: Margarette Asal 06/28/2014, 8:40 AM

## 2014-06-28 NOTE — Progress Notes (Signed)
Pt is discharged in the care of husband. Downstairs per ambulatory with R.N. Judd Lien. Denies pain or discomfort. No equipment needed for home use.Understands all instructions well. Questions asked and answered.Stable.

## 2014-07-10 DIAGNOSIS — H35342 Macular cyst, hole, or pseudohole, left eye: Secondary | ICD-10-CM | POA: Diagnosis not present

## 2014-07-10 DIAGNOSIS — H43821 Vitreomacular adhesion, right eye: Secondary | ICD-10-CM | POA: Diagnosis not present

## 2014-10-17 ENCOUNTER — Other Ambulatory Visit: Payer: Self-pay | Admitting: Internal Medicine

## 2014-11-05 ENCOUNTER — Encounter: Payer: Medicare Other | Admitting: Internal Medicine

## 2014-11-19 ENCOUNTER — Ambulatory Visit (INDEPENDENT_AMBULATORY_CARE_PROVIDER_SITE_OTHER): Payer: Medicare Other | Admitting: Internal Medicine

## 2014-11-19 ENCOUNTER — Encounter: Payer: Self-pay | Admitting: Internal Medicine

## 2014-11-19 ENCOUNTER — Other Ambulatory Visit (INDEPENDENT_AMBULATORY_CARE_PROVIDER_SITE_OTHER): Payer: Medicare Other

## 2014-11-19 VITALS — BP 126/74 | HR 72 | Temp 98.7°F | Resp 18 | Ht 66.0 in | Wt 155.0 lb

## 2014-11-19 DIAGNOSIS — I10 Essential (primary) hypertension: Secondary | ICD-10-CM

## 2014-11-19 DIAGNOSIS — Z23 Encounter for immunization: Secondary | ICD-10-CM | POA: Diagnosis not present

## 2014-11-19 DIAGNOSIS — E785 Hyperlipidemia, unspecified: Secondary | ICD-10-CM | POA: Diagnosis not present

## 2014-11-19 LAB — CBC WITH DIFFERENTIAL/PLATELET
BASOS ABS: 0 10*3/uL (ref 0.0–0.1)
Basophils Relative: 0.4 % (ref 0.0–3.0)
Eosinophils Absolute: 0.3 10*3/uL (ref 0.0–0.7)
Eosinophils Relative: 3.6 % (ref 0.0–5.0)
HEMATOCRIT: 39 % (ref 36.0–46.0)
Hemoglobin: 13.5 g/dL (ref 12.0–15.0)
Lymphocytes Relative: 24 % (ref 12.0–46.0)
Lymphs Abs: 1.8 10*3/uL (ref 0.7–4.0)
MCHC: 34.6 g/dL (ref 30.0–36.0)
MCV: 89.6 fl (ref 78.0–100.0)
MONO ABS: 0.6 10*3/uL (ref 0.1–1.0)
MONOS PCT: 7.5 % (ref 3.0–12.0)
Neutro Abs: 4.7 10*3/uL (ref 1.4–7.7)
Neutrophils Relative %: 64.5 % (ref 43.0–77.0)
Platelets: 351 10*3/uL (ref 150.0–400.0)
RBC: 4.35 Mil/uL (ref 3.87–5.11)
RDW: 12.5 % (ref 11.5–15.5)
WBC: 7.3 10*3/uL (ref 4.0–10.5)

## 2014-11-19 LAB — BASIC METABOLIC PANEL
BUN: 15 mg/dL (ref 6–23)
CALCIUM: 10 mg/dL (ref 8.4–10.5)
CO2: 30 meq/L (ref 19–32)
Chloride: 101 mEq/L (ref 96–112)
Creatinine, Ser: 0.76 mg/dL (ref 0.40–1.20)
GFR: 79.74 mL/min (ref >60.00)
GLUCOSE: 105 mg/dL — AB (ref 70–99)
POTASSIUM: 3.6 meq/L (ref 3.5–5.1)
SODIUM: 138 meq/L (ref 135–145)

## 2014-11-19 LAB — HEPATIC FUNCTION PANEL
ALT: 15 U/L (ref 0–35)
AST: 14 U/L (ref 0–37)
Albumin: 4.7 g/dL (ref 3.5–5.2)
Alkaline Phosphatase: 89 U/L (ref 39–117)
BILIRUBIN DIRECT: 0.1 mg/dL (ref 0.0–0.3)
Total Bilirubin: 0.4 mg/dL (ref 0.2–1.2)
Total Protein: 7.6 g/dL (ref 6.0–8.3)

## 2014-11-19 LAB — LIPID PANEL
Cholesterol: 180 mg/dL (ref 0–200)
HDL: 79.2 mg/dL (ref >39.00)
LDL CALC: 78 mg/dL (ref 0–99)
NonHDL: 100.8
Total CHOL/HDL Ratio: 2
Triglycerides: 112 mg/dL (ref 0.0–149.0)
VLDL: 22.4 mg/dL (ref 0.0–40.0)

## 2014-11-19 LAB — URINALYSIS, ROUTINE W REFLEX MICROSCOPIC
Bilirubin Urine: NEGATIVE
HGB URINE DIPSTICK: NEGATIVE
Ketones, ur: NEGATIVE
Leukocytes, UA: NEGATIVE
Nitrite: NEGATIVE
RBC / HPF: NONE SEEN (ref 0–?)
Specific Gravity, Urine: 1.01 (ref 1.000–1.030)
TOTAL PROTEIN, URINE-UPE24: NEGATIVE
Urine Glucose: NEGATIVE
Urobilinogen, UA: 0.2 (ref 0.0–1.0)
pH: 7 (ref 5.0–8.0)

## 2014-11-19 LAB — TSH: TSH: 1.38 u[IU]/mL (ref 0.35–4.50)

## 2014-11-19 NOTE — Progress Notes (Signed)
Subjective:    Patient ID: Julia Sanchez, female    DOB: [birth year], 69 y.o.   MRN: [id number]  HPI  Here for yearly f/u;  Overall doing ok;  Pt denies Chest pain, worsening SOB, DOE, wheezing, orthopnea, PND, worsening LE edema, palpitations, dizziness or syncope.  Pt denies neurological change such as new headache, facial or extremity weakness.  Pt denies polydipsia, polyuria, or low sugar symptoms. Pt states overall good compliance with treatment and medications, good tolerability, and has been trying to follow appropriate diet.  Pt denies worsening depressive symptoms, suicidal ideation or panic. No fever, night sweats, wt loss, loss of appetite, or other constitutional symptoms.  Pt states good ability with ADL's, has low fall risk, home safety reviewed and adequate, no other significant changes in hearing or vision, and only occasionally active with exercise. Stressful year recenlty with mother put in NH and husbnad with bladder cancer.   Past Medical History  Diagnosis Date  . DIVERTICULOSIS, COLON 04/07/2009  . HYPERLIPIDEMIA 05/03/2007  . HYPERTENSION 05/03/2007  . SVD (spontaneous vaginal delivery)     x 2  . Anxiety     due to stress - no stress   Past Surgical History  Procedure Laterality Date  . Cystoscopy    . Hysteroscopy w/d&c  03/06/2013    Procedure: DILATATION AND CURETTAGE /HYSTEROSCOPY;  Surgeon: Margarette Asal, MD;  Location: [facility];  Service: Gynecology;;  . Tonsillectomy    . Wisdom tooth extraction    . Colonoscopy    . Eye surgery  05/2012    macular hole - left  . Eye surgery  2014    cataract removed left eye  . Laparoscopic assisted vaginal hysterectomy N/A 06/27/2014    Procedure: LAPAROSCOPIC ASSISTED VAGINAL HYSTERECTOMY WITH BILATERAL SALPINGO OOPHORECTOMY;  Surgeon: Margarette Asal, MD;  Location: [facility];  Service: Gynecology;  Laterality: N/A;  . Salpingoophorectomy Bilateral 06/27/2014    Procedure: SALPINGO OOPHORECTOMY;  Surgeon: Margarette Asal, MD;  Location: [facility];  Service: Gynecology;  Laterality: Bilateral;    reports that she has never smoked. She has never used smokeless tobacco. She reports that she drinks alcohol. She reports that she does not use illicit drugs. family history includes Breast cancer in her maternal aunt; Coronary artery disease in her father; Diabetes in her cousin and maternal uncle; Heart disease in her father. Allergies  Allergen Reactions  . Nitrofurantoin Nausea And Vomiting  . Sulfonamide Derivatives Nausea And Vomiting   Current Outpatient Prescriptions on File Prior to Visit  Medication Sig Dispense Refill  . amLODipine (NORVASC) 10 MG tablet TAKE 1 TABLET (10 MG TOTAL) BY MOUTH DAILY. 90 tablet 33  . atorvastatin (LIPITOR) 10 MG tablet TAKE ONE TABLET BY MOUTH ONCE DAILY IN THE EVENING 30 tablet 5  . ibuprofen (ADVIL,MOTRIN) 800 MG tablet Take 1 tablet (800 mg total) by mouth every 8 (eight) hours as needed for moderate pain (mild pain). 30 tablet 1  . potassium chloride (K-DUR) 10 MEQ tablet Take 1 tablet (10 mEq total) by mouth daily. 30 tablet 11  . valsartan-hydrochlorothiazide (DIOVAN-HCT) 160-25 MG per tablet Take 1 tablet by mouth daily. 30 tablet 11  . [DISCONTINUED] potassium chloride (K-DUR,KLOR-CON) 10 MEQ tablet TAKE ONE TABLET BY MOUTH ONCE DAILY 30 tablet 4   Current Facility-Administered Medications on File Prior to Visit  Medication Dose Route Frequency Provider Last Rate Last Dose  . pneumococcal 23 valent vaccine (PNU-IMMUNE) injection 0.5 mL  0.5 mL Intramuscular Tomorrow-1000  Biagio Borg, MD         Review of Systems Constitutional: Negative for increased diaphoresis, other activity, appetite or siginficant weight change other than noted HENT: Negative for worsening hearing loss, ear pain, facial swelling, mouth sores and neck stiffness.   Eyes: Negative for other worsening pain, redness or visual disturbance.  Respiratory: Negative for shortness of breath and  wheezing  Cardiovascular: Negative for chest pain and palpitations.  Gastrointestinal: Negative for diarrhea, blood in stool, abdominal distention or other pain Genitourinary: Negative for hematuria, flank pain or change in urine volume.  Musculoskeletal: Negative for myalgias or other joint complaints.  Skin: Negative for color change and wound or drainage.  Neurological: Negative for syncope and numbness. other than noted Hematological: Negative for adenopathy. or other swelling Psychiatric/Behavioral: Negative for hallucinations, SI, self-injury, decreased concentration or other worsening agitation.      Objective:   Physical Exam BP 126/74 mmHg  Pulse 72  Temp(Src) 98.7 F (37.1 C) (Oral)  Resp 18  Ht 5\' 6"  (1.676 m)  Wt 155 lb (70.308 kg)  BMI 25.03 kg/m2  SpO2 97% VS noted,  Constitutional: Pt is oriented to person, place, and time. Appears well-developed and well-nourished, in no significant distress Head: Normocephalic and atraumatic.  Right Ear: External ear normal.  Left Ear: External ear normal.  Nose: Nose normal.  Mouth/Throat: Oropharynx is clear and moist.  Eyes: Conjunctivae and EOM are normal. Pupils are equal, round, and reactive to light.  Neck: Normal range of motion. Neck supple. No JVD present. No tracheal deviation present or significant neck LA or mass Cardiovascular: Normal rate, regular rhythm, normal heart sounds and intact distal pulses.   Pulmonary/Chest: Effort normal and breath sounds without rales or wheezing  Abdominal: Soft. Bowel sounds are normal. NT. No HSM  Musculoskeletal: Normal range of motion. Exhibits no edema.  Lymphadenopathy:  Has no cervical adenopathy.  Neurological: Pt is alert and oriented to person, place, and time. Pt has normal reflexes. No cranial nerve deficit. Motor grossly intact Skin: Skin is warm and dry. No rash noted.  Psychiatric:  Has normal mood and affect. Behavior is normal.      Assessment & Plan:

## 2014-11-19 NOTE — Assessment & Plan Note (Signed)
stable overall by history and exam, recent data reviewed with pt, and pt to continue medical treatment as before,  to f/u any worsening symptoms or concerns Lab Results  Component Value Date   LDLCALC 74 10/24/2013

## 2014-11-19 NOTE — Patient Instructions (Addendum)
You had the Td (tetanus) shot today  Please continue all other medications as before, and refills have been done if requested.  Please have the pharmacy call with any other refills you may need.  Please continue your efforts at being more active, low cholesterol diet, and weight control.  You are otherwise up to date with prevention measures today.  Please keep your appointments with your specialists as you may have planned

## 2014-11-19 NOTE — Progress Notes (Signed)
Pre visit review using our clinic review tool, if applicable. No additional management support is needed unless otherwise documented below in the visit note. 

## 2014-11-19 NOTE — Assessment & Plan Note (Signed)
stable overall by history and exam, recent data reviewed with pt, and pt to continue medical treatment as before,  to f/u any worsening symptoms or concerns BP Readings from Last 3 Encounters:  11/19/14 126/74  06/28/14 112/50  10/24/13 154/76

## 2014-11-19 NOTE — Addendum Note (Signed)
Addended by: Valerie Salts on: 11/19/2014 03:26 PM   Modules accepted: Orders

## 2014-11-20 ENCOUNTER — Other Ambulatory Visit: Payer: Self-pay | Admitting: Internal Medicine

## 2015-04-16 ENCOUNTER — Other Ambulatory Visit: Payer: Self-pay | Admitting: Obstetrics and Gynecology

## 2015-04-16 DIAGNOSIS — Z779 Other contact with and (suspected) exposures hazardous to health: Secondary | ICD-10-CM | POA: Diagnosis not present

## 2015-04-16 DIAGNOSIS — Z6826 Body mass index (BMI) 26.0-26.9, adult: Secondary | ICD-10-CM | POA: Diagnosis not present

## 2015-04-16 DIAGNOSIS — Z1231 Encounter for screening mammogram for malignant neoplasm of breast: Secondary | ICD-10-CM | POA: Diagnosis not present

## 2015-04-16 DIAGNOSIS — Z124 Encounter for screening for malignant neoplasm of cervix: Secondary | ICD-10-CM | POA: Diagnosis not present

## 2015-04-17 LAB — CYTOLOGY - PAP

## 2015-06-16 DIAGNOSIS — Z23 Encounter for immunization: Secondary | ICD-10-CM | POA: Diagnosis not present

## 2015-08-12 DIAGNOSIS — Z779 Other contact with and (suspected) exposures hazardous to health: Secondary | ICD-10-CM | POA: Diagnosis not present

## 2015-09-03 ENCOUNTER — Other Ambulatory Visit: Payer: Self-pay

## 2015-09-03 MED ORDER — VALSARTAN-HYDROCHLOROTHIAZIDE 160-25 MG PO TABS: 1.0000 | ORAL_TABLET | Freq: Every day | ORAL | Status: DC

## 2015-09-03 MED ORDER — ATORVASTATIN CALCIUM 10 MG PO TABS: ORAL_TABLET | ORAL | Status: DC

## 2015-10-20 DIAGNOSIS — H35342 Macular cyst, hole, or pseudohole, left eye: Secondary | ICD-10-CM | POA: Diagnosis not present

## 2015-10-20 DIAGNOSIS — H43821 Vitreomacular adhesion, right eye: Secondary | ICD-10-CM | POA: Diagnosis not present

## 2015-10-20 DIAGNOSIS — H26492 Other secondary cataract, left eye: Secondary | ICD-10-CM | POA: Diagnosis not present

## 2015-10-20 DIAGNOSIS — H2511 Age-related nuclear cataract, right eye: Secondary | ICD-10-CM | POA: Diagnosis not present

## 2015-10-30 ENCOUNTER — Other Ambulatory Visit: Payer: Self-pay | Admitting: Internal Medicine

## 2015-11-20 DIAGNOSIS — H35342 Macular cyst, hole, or pseudohole, left eye: Secondary | ICD-10-CM | POA: Diagnosis not present

## 2015-11-20 DIAGNOSIS — H43821 Vitreomacular adhesion, right eye: Secondary | ICD-10-CM | POA: Diagnosis not present

## 2015-11-25 ENCOUNTER — Ambulatory Visit (INDEPENDENT_AMBULATORY_CARE_PROVIDER_SITE_OTHER): Payer: Medicare Other | Admitting: Internal Medicine

## 2015-11-25 ENCOUNTER — Other Ambulatory Visit (INDEPENDENT_AMBULATORY_CARE_PROVIDER_SITE_OTHER): Payer: Medicare Other

## 2015-11-25 ENCOUNTER — Encounter: Payer: Self-pay | Admitting: Internal Medicine

## 2015-11-25 VITALS — BP 140/60 | HR 89 | Temp 98.3°F | Resp 20 | Wt 167.0 lb

## 2015-11-25 DIAGNOSIS — E785 Hyperlipidemia, unspecified: Secondary | ICD-10-CM

## 2015-11-25 DIAGNOSIS — Z1159 Encounter for screening for other viral diseases: Secondary | ICD-10-CM

## 2015-11-25 DIAGNOSIS — I10 Essential (primary) hypertension: Secondary | ICD-10-CM

## 2015-11-25 DIAGNOSIS — B349 Viral infection, unspecified: Secondary | ICD-10-CM

## 2015-11-25 DIAGNOSIS — R739 Hyperglycemia, unspecified: Secondary | ICD-10-CM

## 2015-11-25 LAB — HEPATIC FUNCTION PANEL
ALT: 13 U/L (ref 0–35)
AST: 14 U/L (ref 0–37)
Albumin: 4.6 g/dL (ref 3.5–5.2)
Alkaline Phosphatase: 83 U/L (ref 39–117)
BILIRUBIN TOTAL: 0.7 mg/dL (ref 0.2–1.2)
Bilirubin, Direct: 0.1 mg/dL (ref 0.0–0.3)
Total Protein: 7.5 g/dL (ref 6.0–8.3)

## 2015-11-25 LAB — URINALYSIS, ROUTINE W REFLEX MICROSCOPIC
BILIRUBIN URINE: NEGATIVE
HGB URINE DIPSTICK: NEGATIVE
Ketones, ur: NEGATIVE
LEUKOCYTES UA: NEGATIVE
Nitrite: NEGATIVE
RBC / HPF: NONE SEEN (ref 0–?)
Specific Gravity, Urine: 1.01 (ref 1.000–1.030)
Total Protein, Urine: NEGATIVE
Urine Glucose: NEGATIVE
Urobilinogen, UA: 0.2 (ref 0.0–1.0)
pH: 6 (ref 5.0–8.0)

## 2015-11-25 LAB — CBC WITH DIFFERENTIAL/PLATELET
Basophils Absolute: 0.1 10*3/uL (ref 0.0–0.1)
Basophils Relative: 0.6 % (ref 0.0–3.0)
EOS PCT: 1.1 % (ref 0.0–5.0)
Eosinophils Absolute: 0.1 10*3/uL (ref 0.0–0.7)
HCT: 38.6 % (ref 36.0–46.0)
HEMOGLOBIN: 13.2 g/dL (ref 12.0–15.0)
Lymphocytes Relative: 17.3 % (ref 12.0–46.0)
Lymphs Abs: 1.5 10*3/uL (ref 0.7–4.0)
MCHC: 34.3 g/dL (ref 30.0–36.0)
MCV: 88.2 fl (ref 78.0–100.0)
MONO ABS: 0.7 10*3/uL (ref 0.1–1.0)
MONOS PCT: 7.7 % (ref 3.0–12.0)
Neutro Abs: 6.4 10*3/uL (ref 1.4–7.7)
Neutrophils Relative %: 73.3 % (ref 43.0–77.0)
Platelets: 523 10*3/uL — ABNORMAL HIGH (ref 150.0–400.0)
RBC: 4.38 Mil/uL (ref 3.87–5.11)
RDW: 12.7 % (ref 11.5–15.5)
WBC: 8.7 10*3/uL (ref 4.0–10.5)

## 2015-11-25 LAB — HEMOGLOBIN A1C: HEMOGLOBIN A1C: 6.2 % (ref 4.6–6.5)

## 2015-11-25 LAB — LIPID PANEL
CHOL/HDL RATIO: 3
Cholesterol: 187 mg/dL (ref 0–200)
HDL: 61.6 mg/dL (ref >39.00)
LDL CALC: 105 mg/dL — AB (ref 0–99)
NonHDL: 125.08
TRIGLYCERIDES: 101 mg/dL (ref 0.0–149.0)
VLDL: 20.2 mg/dL (ref 0.0–40.0)

## 2015-11-25 LAB — BASIC METABOLIC PANEL
BUN: 13 mg/dL (ref 6–23)
CO2: 31 mEq/L (ref 19–32)
Calcium: 9.9 mg/dL (ref 8.4–10.5)
Chloride: 96 mEq/L (ref 96–112)
Creatinine, Ser: 0.66 mg/dL (ref 0.40–1.20)
GFR: 93.57 mL/min (ref >60.00)
Glucose, Bld: 111 mg/dL — ABNORMAL HIGH (ref 70–99)
Potassium: 3.5 mEq/L (ref 3.5–5.1)
SODIUM: 136 meq/L (ref 135–145)

## 2015-11-25 LAB — TSH: TSH: 1.4 u[IU]/mL (ref 0.35–4.50)

## 2015-11-25 MED ORDER — ASPIRIN EC 81 MG PO TBEC: 81.0000 mg | DELAYED_RELEASE_TABLET | Freq: Every day | ORAL | Status: AC

## 2015-11-25 MED ORDER — POTASSIUM CHLORIDE ER 10 MEQ PO TBCR: EXTENDED_RELEASE_TABLET | ORAL | Status: DC

## 2015-11-25 MED ORDER — VALSARTAN-HYDROCHLOROTHIAZIDE 160-25 MG PO TABS: 1.0000 | ORAL_TABLET | Freq: Every day | ORAL | Status: DC

## 2015-11-25 MED ORDER — ATORVASTATIN CALCIUM 10 MG PO TABS: ORAL_TABLET | ORAL | Status: DC

## 2015-11-25 MED ORDER — AMLODIPINE BESYLATE 10 MG PO TABS: 10.0000 mg | ORAL_TABLET | Freq: Every day | ORAL | Status: DC

## 2015-11-25 NOTE — Patient Instructions (Signed)
Please start the Aspirin 81 mg  - 1 per day  Please continue all other medications as before, and refills have been done if requested.  Please have the pharmacy call with any other refills you may need.  Please continue your efforts at being more active, low cholesterol diet, and weight control.  You are otherwise up to date with prevention measures today.  Please keep your appointments with your specialists as you may have planned  Please go to the LAB in the Basement (turn left off the elevator) for the tests to be done today  You will be contacted by phone if any changes need to be made immediately.  Otherwise, you will receive a letter about your results with an explanation, but please check with MyChart first.  Please remember to sign up for MyChart if you have not done so, as this will be important to you in the future with finding out test results, communicating by private email, and scheduling acute appointments online when needed.  Please return in 1 year for your yearly visit, or sooner if needed

## 2015-11-25 NOTE — Assessment & Plan Note (Signed)
Improved likely viral, c/o fatioge, exam benign, ok to follow, for cbc today

## 2015-11-25 NOTE — Progress Notes (Signed)
Pre visit review using our clinic review tool, if applicable. No additional management support is needed unless otherwise documented below in the visit note. 

## 2015-11-25 NOTE — Assessment & Plan Note (Signed)
stable overall by history and exam, recent data reviewed with pt, and pt to continue medical treatment as before,  to f/u any worsening symptoms or concerns Lab Results  Component Value Date   HGBA1C 5.8 04/19/2007

## 2015-11-25 NOTE — Assessment & Plan Note (Signed)
stable overall by history and exam, recent data reviewed with pt, and pt to continue medical treatment as before,  to f/u any worsening symptoms or concerns BP Readings from Last 3 Encounters:  11/25/15 140/60  11/19/14 126/74  06/28/14 112/50

## 2015-11-25 NOTE — Assessment & Plan Note (Signed)
stable overall by history and exam, recent data reviewed with pt, and pt to continue medical treatment as before,  to f/u any worsening symptoms or concerns  Lab Results  Component Value Date   LDLCALC 78 11/19/2014   For f/u labs today

## 2015-11-25 NOTE — Progress Notes (Signed)
Subjective:    Patient ID: Julia Sanchez, female    DOB: [birth year], 70 y.o.   MRN: [id number]  HPI  Here for yearly f/u;  Overall doing ok;  Pt denies Chest pain, worsening SOB, DOE, wheezing, orthopnea, PND, worsening LE edema, palpitations, dizziness or syncope.  Pt denies neurological change such as new headache, facial or extremity weakness.  Pt denies polydipsia, polyuria, or low sugar symptoms. Pt states overall good compliance with treatment and medications, good tolerability, and has been trying to follow appropriate diet.  Pt denies worsening depressive symptoms, suicidal ideation or panic. No fever, night sweats, wt loss, loss of appetite, or other constitutional symptoms.  Pt states good ability with ADL's, has low fall risk, home safety reviewed and adequate, no other significant changes in hearing or vision, and only occasionally active with exercise, plans to be more active soon.  Did have Recent flu like illness with cough and congestion,, in bed for 10 days, still fatigued but mostly resolved, Husband ill with same. Declines dxa here, will get at GYN. Not taking asa currently.  Past Medical History  Diagnosis Date  . DIVERTICULOSIS, COLON 04/07/2009  . HYPERLIPIDEMIA 05/03/2007  . HYPERTENSION 05/03/2007  . SVD (spontaneous vaginal delivery)     x 2  . Anxiety     due to stress - no stress   Past Surgical History  Procedure Laterality Date  . Cystoscopy    . Hysteroscopy w/d&c  03/06/2013    Procedure: DILATATION AND CURETTAGE /HYSTEROSCOPY;  Surgeon: Margarette Asal, MD;  Location: [facility];  Service: Gynecology;;  . Tonsillectomy    . Wisdom tooth extraction    . Colonoscopy    . Eye surgery  05/2012    macular hole - left  . Eye surgery  2014    cataract removed left eye  . Laparoscopic assisted vaginal hysterectomy N/A 06/27/2014    Procedure: LAPAROSCOPIC ASSISTED VAGINAL HYSTERECTOMY WITH BILATERAL SALPINGO OOPHORECTOMY;  Surgeon: Margarette Asal, MD;  Location:  [facility];  Service: Gynecology;  Laterality: N/A;  . Salpingoophorectomy Bilateral 06/27/2014    Procedure: SALPINGO OOPHORECTOMY;  Surgeon: Margarette Asal, MD;  Location: [facility];  Service: Gynecology;  Laterality: Bilateral;    reports that she has never smoked. She has never used smokeless tobacco. She reports that she drinks alcohol. She reports that she does not use illicit drugs. family history includes Breast cancer in her maternal aunt; Coronary artery disease in her father; Diabetes in her cousin and maternal uncle; Heart disease in her father. Allergies  Allergen Reactions  . Nitrofurantoin Nausea And Vomiting  . Sulfonamide Derivatives Nausea And Vomiting   Current Outpatient Prescriptions on File Prior to Visit  Medication Sig Dispense Refill  . ibuprofen (ADVIL,MOTRIN) 800 MG tablet Take 1 tablet (800 mg total) by mouth every 8 (eight) hours as needed for moderate pain (mild pain). 30 tablet 1  . [DISCONTINUED] potassium chloride (K-DUR,KLOR-CON) 10 MEQ tablet TAKE ONE TABLET BY MOUTH ONCE DAILY 30 tablet 4   Current Facility-Administered Medications on File Prior to Visit  Medication Dose Route Frequency Provider Last Rate Last Dose  . pneumococcal 23 valent vaccine (PNU-IMMUNE) injection 0.5 mL  0.5 mL Intramuscular Tomorrow-1000 Biagio Borg, MD        Review of Systems Constitutional: Negative for increased diaphoresis, or other activity, appetite or siginficant weight change other than noted HENT: Negative for worsening hearing loss, ear pain, facial swelling, mouth sores and neck stiffness.   Eyes:  Negative for other worsening pain, redness or visual disturbance.  Respiratory: Negative for choking or stridor Cardiovascular: Negative for other chest pain and palpitations.  Gastrointestinal: Negative for worsening diarrhea, blood in stool, or abdominal distention Genitourinary: Negative for hematuria, flank pain or change in urine volume.  Musculoskeletal: Negative for  myalgias or other joint complaints.  Skin: Negative for other color change and wound or drainage.  Neurological: Negative for syncope and numbness. other than noted Hematological: Negative for adenopathy. or other swelling Psychiatric/Behavioral: Negative for hallucinations, SI, self-injury, decreased concentration or other worsening agitation.      Objective:   Physical Exam BP 140/60 mmHg  Pulse 89  Temp(Src) 98.3 F (36.8 C) (Oral)  Resp 20  Wt 167 lb (75.751 kg)  SpO2 97% VS noted, not ill appearing Constitutional: Pt is oriented to person, place, and time. Appears well-developed and well-nourished, in no significant distress Head: Normocephalic and atraumatic  Eyes: Conjunctivae and EOM are normal. Pupils are equal, round, and reactive to light Right Ear: External ear normal.  Left Ear: External ear normal Nose: Nose normal.  Mouth/Throat: Oropharynx is clear and moist  Neck: Normal range of motion. Neck supple. No JVD present. No tracheal deviation present or significant neck LA or mass Cardiovascular: Normal rate, regular rhythm, normal heart sounds and intact distal pulses.   Pulmonary/Chest: Effort normal and breath sounds without rales or wheezing  Abdominal: Soft. Bowel sounds are normal. NT. No HSM  Musculoskeletal: Normal range of motion. Exhibits no edema Lymphadenopathy: Has no cervical adenopathy.  Neurological: Pt is alert and oriented to person, place, and time. Pt has normal reflexes. No cranial nerve deficit. Motor grossly intact Skin: Skin is warm and dry. No rash noted or new ulcers Psychiatric:  Has normal mood and affect. Behavior is normal.     Assessment & Plan:

## 2015-11-26 LAB — HEPATITIS C ANTIBODY: HCV Ab: NEGATIVE

## 2015-12-03 ENCOUNTER — Ambulatory Visit: Payer: Self-pay | Admitting: Internal Medicine

## 2016-01-07 DIAGNOSIS — Z779 Other contact with and (suspected) exposures hazardous to health: Secondary | ICD-10-CM | POA: Diagnosis not present

## 2016-01-15 DIAGNOSIS — N952 Postmenopausal atrophic vaginitis: Secondary | ICD-10-CM | POA: Diagnosis not present

## 2016-01-15 DIAGNOSIS — R87612 Low grade squamous intraepithelial lesion on cytologic smear of cervix (LGSIL): Secondary | ICD-10-CM | POA: Diagnosis not present

## 2016-05-07 ENCOUNTER — Ambulatory Visit (INDEPENDENT_AMBULATORY_CARE_PROVIDER_SITE_OTHER): Payer: Medicare Other | Admitting: Internal Medicine

## 2016-05-07 ENCOUNTER — Telehealth: Payer: Self-pay | Admitting: Internal Medicine

## 2016-05-07 ENCOUNTER — Encounter: Payer: Self-pay | Admitting: Internal Medicine

## 2016-05-07 DIAGNOSIS — K5732 Diverticulitis of large intestine without perforation or abscess without bleeding: Secondary | ICD-10-CM | POA: Diagnosis not present

## 2016-05-07 MED ORDER — METRONIDAZOLE 500 MG PO TABS: 500.0000 mg | ORAL_TABLET | Freq: Three times a day (TID) | ORAL | 0 refills | Status: DC

## 2016-05-07 MED ORDER — CIPROFLOXACIN HCL 500 MG PO TABS: 500.0000 mg | ORAL_TABLET | Freq: Two times a day (BID) | ORAL | 0 refills | Status: DC

## 2016-05-07 NOTE — Telephone Encounter (Signed)
I am sorry to hear she is not feeling well.   She has not been seen in the clinic in so long that it would not be proper care to just prescribe some meds without being able to evaluate her.  Unfortunately, we have no availability to do that as noted below.  Her symptoms are concerning for diverticulitis, so it should not go unattended.  Therefore, I recommend her primary care, if available, or a [facility]. There is one on [address] next to [facility], and one on [address] in [place].  Apologies we could not do more for her today.

## 2016-05-07 NOTE — Telephone Encounter (Signed)
Spoke with pt and she is aware and will seek care with PCP or urgent care.

## 2016-05-07 NOTE — Progress Notes (Signed)
   Subjective:    Patient ID: Julia Sanchez, female    DOB: 12-Apr-1946, 70 y.o.   MRN: [id number]  HPI The patient is a 70 YO female coming in for diverticulitis flare. She has had two prior flares with similar symptoms. She gets pain in the lower abdomen as well as nausea and decreased appetite. The pain started 2 days ago and she has not had an appetite all week. Generally next she will get diarrhea but if she is able to start antibiotics   Review of Systems  Constitutional: Positive for appetite change and chills. Negative for activity change, fatigue, fever and unexpected weight change.  HENT: Negative.   Eyes: Negative.   Respiratory: Negative.   Cardiovascular: Negative.   Gastrointestinal: Positive for abdominal pain and nausea. Negative for abdominal distention, blood in stool, constipation, diarrhea and vomiting.  Musculoskeletal: Negative.   Neurological: Negative.       Objective:   Physical Exam  Constitutional: She is oriented to person, place, and time. She appears well-developed and well-nourished.  HENT:  Head: Normocephalic and atraumatic.  Eyes: EOM are normal.  Neck: Normal range of motion.  Cardiovascular: Normal rate and regular rhythm.   Pulmonary/Chest: Effort normal and breath sounds normal. No respiratory distress. She has no wheezes. She has no rales.  Abdominal: Soft. Bowel sounds are normal. She exhibits no distension. There is tenderness. There is no rebound and no guarding.  Pain in the lower abdomen which does not radiate and no rebound.   Neurological: She is alert and oriented to person, place, and time.  Skin: Skin is warm and dry.   Vitals:   05/07/16 1411 05/07/16 1428  BP: (!) 152/90 (!) 160/90  Pulse: 83   Resp: 12   Temp: 99 F (37.2 C)   TempSrc: Oral   SpO2: 98%   Weight: 168 lb 6.4 oz (76.4 kg)   Height: 5' 6.5" (1.689 m)       Assessment & Plan:

## 2016-05-07 NOTE — Progress Notes (Signed)
Pre visit review using our clinic review tool, if applicable. No additional management support is needed unless otherwise documented below in the visit note. 

## 2016-05-07 NOTE — Assessment & Plan Note (Signed)
Rx for cipro and flagyl for her symptoms. Previously she has had imaging during a flare with confirmation. Given 2 week rx and encouraged to take 1 week and if symptoms not fully resolved take full 2 weeks.

## 2016-05-07 NOTE — Patient Instructions (Signed)
We have sent in the ciprofloxacin and flagyl.   Cipro take 1 pill twice a day for 1 week.   Flagyl take 1 pill three times a day for 1 week.   If the symptoms are gone at 1 week stop taking the medicine. If you are still having any symptoms at 1 week take the full 2 weeks of medicine.

## 2016-05-07 NOTE — Telephone Encounter (Signed)
Previous Julia Sanchez pt, has not been seen since 2011. Pt has had problems with diverticulitis in the past. States that she is having some issues with abdominal pressure and discomfort in the same area where she had last time she had diverticulitis. States he has pain in the mid-low abdominal area, states it is not constant yet but is did keep her up last night. She is not running a fever, states it started 2 days ago. Pt is leaving to go out of town and wants to know if some meds can be called in for her. No midlevel appointments available until 05/18/16. Dr. Loletha Carrow as doc of the day please advise.

## 2016-05-07 NOTE — Telephone Encounter (Signed)
Calling again to speak to nurse.

## 2016-05-14 ENCOUNTER — Telehealth: Payer: Self-pay | Admitting: Internal Medicine

## 2016-05-14 NOTE — Telephone Encounter (Signed)
I spoke with patient. She understood. Stated she is going to wait until next week or so and see how she is feeling. Thank you.

## 2016-05-14 NOTE — Telephone Encounter (Signed)
Patient called stated her diverticulitis is sending pains up her legs. She states she is going to start the 2nd week of medication Dr.Crawford gave her last Friday. She is wanting to know what else can be done. IF there is something else she could be taking. Please follow up with patient.Thank you.

## 2016-05-14 NOTE — Telephone Encounter (Signed)
Recommend visit if not improved.

## 2016-05-14 NOTE — Telephone Encounter (Signed)
I am not sure, but I will forward to Dr Sharlet Salina to address, thanks

## 2016-05-27 ENCOUNTER — Ambulatory Visit: Payer: Medicare Other

## 2016-06-03 ENCOUNTER — Ambulatory Visit (INDEPENDENT_AMBULATORY_CARE_PROVIDER_SITE_OTHER): Payer: Medicare Other

## 2016-06-03 DIAGNOSIS — Z23 Encounter for immunization: Secondary | ICD-10-CM

## 2016-09-02 DIAGNOSIS — Z6827 Body mass index (BMI) 27.0-27.9, adult: Secondary | ICD-10-CM | POA: Diagnosis not present

## 2016-09-02 DIAGNOSIS — Z124 Encounter for screening for malignant neoplasm of cervix: Secondary | ICD-10-CM | POA: Diagnosis not present

## 2016-09-02 DIAGNOSIS — Z779 Other contact with and (suspected) exposures hazardous to health: Secondary | ICD-10-CM | POA: Diagnosis not present

## 2016-09-02 DIAGNOSIS — Z1231 Encounter for screening mammogram for malignant neoplasm of breast: Secondary | ICD-10-CM | POA: Diagnosis not present

## 2016-11-22 DIAGNOSIS — H26492 Other secondary cataract, left eye: Secondary | ICD-10-CM | POA: Diagnosis not present

## 2016-11-22 DIAGNOSIS — Z961 Presence of intraocular lens: Secondary | ICD-10-CM | POA: Diagnosis not present

## 2016-11-22 DIAGNOSIS — H35342 Macular cyst, hole, or pseudohole, left eye: Secondary | ICD-10-CM | POA: Diagnosis not present

## 2016-11-24 ENCOUNTER — Ambulatory Visit (INDEPENDENT_AMBULATORY_CARE_PROVIDER_SITE_OTHER): Payer: Medicare Other | Admitting: Internal Medicine

## 2016-11-24 ENCOUNTER — Other Ambulatory Visit (INDEPENDENT_AMBULATORY_CARE_PROVIDER_SITE_OTHER): Payer: Medicare Other

## 2016-11-24 ENCOUNTER — Encounter: Payer: Self-pay | Admitting: Internal Medicine

## 2016-11-24 VITALS — BP 164/80 | HR 83 | Temp 98.3°F | Ht 67.0 in | Wt 170.0 lb

## 2016-11-24 DIAGNOSIS — I10 Essential (primary) hypertension: Secondary | ICD-10-CM

## 2016-11-24 DIAGNOSIS — R739 Hyperglycemia, unspecified: Secondary | ICD-10-CM

## 2016-11-24 DIAGNOSIS — G629 Polyneuropathy, unspecified: Secondary | ICD-10-CM | POA: Diagnosis not present

## 2016-11-24 DIAGNOSIS — D75839 Thrombocytosis, unspecified: Secondary | ICD-10-CM | POA: Insufficient documentation

## 2016-11-24 DIAGNOSIS — E785 Hyperlipidemia, unspecified: Secondary | ICD-10-CM

## 2016-11-24 DIAGNOSIS — D473 Essential (hemorrhagic) thrombocythemia: Secondary | ICD-10-CM | POA: Insufficient documentation

## 2016-11-24 DIAGNOSIS — E538 Deficiency of other specified B group vitamins: Secondary | ICD-10-CM

## 2016-11-24 DIAGNOSIS — Z0000 Encounter for general adult medical examination without abnormal findings: Secondary | ICD-10-CM

## 2016-11-24 DIAGNOSIS — R011 Cardiac murmur, unspecified: Secondary | ICD-10-CM | POA: Diagnosis not present

## 2016-11-24 LAB — URINALYSIS, ROUTINE W REFLEX MICROSCOPIC
BILIRUBIN URINE: NEGATIVE
Hgb urine dipstick: NEGATIVE
KETONES UR: NEGATIVE
LEUKOCYTES UA: NEGATIVE
Nitrite: NEGATIVE
PH: 6 (ref 5.0–8.0)
RBC / HPF: NONE SEEN (ref 0–?)
Specific Gravity, Urine: <=1.005 — AB (ref 1.000–1.030)
TOTAL PROTEIN, URINE-UPE24: NEGATIVE
UROBILINOGEN UA: 0.2 (ref 0.0–1.0)
Urine Glucose: NEGATIVE
WBC UA: NONE SEEN (ref 0–?)

## 2016-11-24 LAB — CBC WITH DIFFERENTIAL/PLATELET
BASOS ABS: 0 10*3/uL (ref 0.0–0.1)
Basophils Relative: 0.6 % (ref 0.0–3.0)
EOS ABS: 0.2 10*3/uL (ref 0.0–0.7)
Eosinophils Relative: 3 % (ref 0.0–5.0)
HCT: 40.9 % (ref 36.0–46.0)
HEMOGLOBIN: 13.9 g/dL (ref 12.0–15.0)
Lymphocytes Relative: 16.1 % (ref 12.0–46.0)
Lymphs Abs: 1.2 10*3/uL (ref 0.7–4.0)
MCHC: 33.9 g/dL (ref 30.0–36.0)
MCV: 88.5 fl (ref 78.0–100.0)
Monocytes Absolute: 0.6 10*3/uL (ref 0.1–1.0)
Monocytes Relative: 7.7 % (ref 3.0–12.0)
NEUTROS ABS: 5.3 10*3/uL (ref 1.4–7.7)
Neutrophils Relative %: 72.6 % (ref 43.0–77.0)
PLATELETS: 391 10*3/uL (ref 150.0–400.0)
RBC: 4.62 Mil/uL (ref 3.87–5.11)
RDW: 13.2 % (ref 11.5–15.5)
WBC: 7.3 10*3/uL (ref 4.0–10.5)

## 2016-11-24 LAB — BASIC METABOLIC PANEL
BUN: 14 mg/dL (ref 6–23)
CO2: 26 meq/L (ref 19–32)
CREATININE: 0.71 mg/dL (ref 0.40–1.20)
Calcium: 9.9 mg/dL (ref 8.4–10.5)
Chloride: 100 mEq/L (ref 96–112)
GFR: 85.77 mL/min (ref >60.00)
GLUCOSE: 121 mg/dL — AB (ref 70–99)
Potassium: 3.5 mEq/L (ref 3.5–5.1)
Sodium: 137 mEq/L (ref 135–145)

## 2016-11-24 LAB — LIPID PANEL
Cholesterol: 204 mg/dL — ABNORMAL HIGH (ref 0–200)
HDL: 80.1 mg/dL (ref >39.00)
LDL Cholesterol: 103 mg/dL — ABNORMAL HIGH (ref 0–99)
NonHDL: 123.72
TRIGLYCERIDES: 104 mg/dL (ref 0.0–149.0)
Total CHOL/HDL Ratio: 3
VLDL: 20.8 mg/dL (ref 0.0–40.0)

## 2016-11-24 LAB — HEPATIC FUNCTION PANEL
ALBUMIN: 4.9 g/dL (ref 3.5–5.2)
ALT: 22 U/L (ref 0–35)
AST: 18 U/L (ref 0–37)
Alkaline Phosphatase: 97 U/L (ref 39–117)
Bilirubin, Direct: 0.2 mg/dL (ref 0.0–0.3)
TOTAL PROTEIN: 7.7 g/dL (ref 6.0–8.3)
Total Bilirubin: 0.5 mg/dL (ref 0.2–1.2)

## 2016-11-24 LAB — TSH: TSH: 1.59 u[IU]/mL (ref 0.35–4.50)

## 2016-11-24 LAB — HEMOGLOBIN A1C: HEMOGLOBIN A1C: 6.2 % (ref 4.6–6.5)

## 2016-11-24 LAB — VITAMIN B12: Vitamin B-12: 267 pg/mL (ref 211–911)

## 2016-11-24 MED ORDER — POTASSIUM CHLORIDE ER 10 MEQ PO TBCR: EXTENDED_RELEASE_TABLET | ORAL | 3 refills | Status: DC

## 2016-11-24 MED ORDER — AMLODIPINE BESYLATE 10 MG PO TABS: 10.0000 mg | ORAL_TABLET | Freq: Every day | ORAL | 3 refills | Status: DC

## 2016-11-24 MED ORDER — AMITRIPTYLINE HCL 50 MG PO TABS: 50.0000 mg | ORAL_TABLET | Freq: Every day | ORAL | 1 refills | Status: DC

## 2016-11-24 MED ORDER — ATORVASTATIN CALCIUM 20 MG PO TABS: 20.0000 mg | ORAL_TABLET | Freq: Every day | ORAL | 3 refills | Status: DC

## 2016-11-24 MED ORDER — VALSARTAN-HYDROCHLOROTHIAZIDE 320-25 MG PO TABS: 1.0000 | ORAL_TABLET | Freq: Every day | ORAL | 3 refills | Status: DC

## 2016-11-24 NOTE — Progress Notes (Signed)
Subjective:    Patient ID: Julia Sanchez, female    DOB: Aug 26, 1945, 71 y.o.   MRN: [id number]  HPI  Here for yearly f/u;  Overall doing ok;  Pt denies Chest pain, worsening SOB, DOE, wheezing, orthopnea, PND, worsening LE edema, palpitations, dizziness or syncope.  Pt denies neurological change such as new headache, facial or extremity weakness, but does have burniing neuritic pain to try to go to sleep at night.    Pt denies polydipsia, polyuria, or low sugar symptoms. Pt states overall good compliance with treatment and medications, good tolerability, and has been trying to follow appropriate diet.  Pt denies worsening depressive symptoms, suicidal ideation or panic. No fever, night sweats, wt loss, loss of appetite, or other constitutional symptoms.  Pt states good ability with ADL's, has low fall risk, home safety reviewed and adequate, no other significant changes in hearing or vision, and only occasionally active with exercise. Due for DXA, pap, mamogram with GYN.  Has heart murmur all her life, but cannot recall a prior echo.  Has ongoing stress at home with disabled son in his 47's now her depenedent again Past Medical History:  Diagnosis Date  . Anxiety    due to stress - no stress  . DIVERTICULOSIS, COLON 04/07/2009  . HYPERLIPIDEMIA 05/03/2007  . HYPERTENSION 05/03/2007  . SVD (spontaneous vaginal delivery)    x 2   Past Surgical History:  Procedure Laterality Date  . COLONOSCOPY    . CYSTOSCOPY    . EYE SURGERY  05/2012   macular hole - left  . EYE SURGERY  2014   cataract removed left eye  . HYSTEROSCOPY W/D&C  03/06/2013   Procedure: DILATATION AND CURETTAGE /HYSTEROSCOPY;  Surgeon: Margarette Asal, MD;  Location: [facility];  Service: Gynecology;;  . LAPAROSCOPIC ASSISTED VAGINAL HYSTERECTOMY N/A 06/27/2014   Procedure: LAPAROSCOPIC ASSISTED VAGINAL HYSTERECTOMY WITH BILATERAL SALPINGO OOPHORECTOMY;  Surgeon: Margarette Asal, MD;  Location: [facility];  Service: Gynecology;   Laterality: N/A;  . SALPINGOOPHORECTOMY Bilateral 06/27/2014   Procedure: SALPINGO OOPHORECTOMY;  Surgeon: Margarette Asal, MD;  Location: [facility];  Service: Gynecology;  Laterality: Bilateral;  . TONSILLECTOMY    . WISDOM TOOTH EXTRACTION      reports that she has never smoked. She has never used smokeless tobacco. She reports that she drinks alcohol. She reports that she does not use drugs. family history includes Breast cancer in her maternal aunt; Coronary artery disease in her father; Diabetes in her cousin and maternal uncle; Heart disease in her father. Allergies  Allergen Reactions  . Nitrofurantoin Nausea And Vomiting  . Sulfonamide Derivatives Nausea And Vomiting   Current Outpatient Prescriptions on File Prior to Visit  Medication Sig Dispense Refill  . amLODipine (NORVASC) 10 MG tablet Take 1 tablet (10 mg total) by mouth daily. 90 tablet 3  . aspirin EC 81 MG tablet Take 1 tablet (81 mg total) by mouth daily. 90 tablet 11  . atorvastatin (LIPITOR) 10 MG tablet TAKE ONE TABLET BY MOUTH ONCE DAILY IN THE EVENING 90 tablet 3  . ibuprofen (ADVIL,MOTRIN) 800 MG tablet Take 1 tablet (800 mg total) by mouth every 8 (eight) hours as needed for moderate pain (mild pain). 30 tablet 1  . potassium chloride (K-DUR) 10 MEQ tablet TAKE 1 TABLET (10 MEQ TOTAL) BY MOUTH DAILY. 90 tablet 3  . valsartan-hydrochlorothiazide (DIOVAN-HCT) 160-25 MG tablet Take 1 tablet by mouth daily. 90 tablet 3  . [DISCONTINUED] potassium chloride (K-DUR,KLOR-CON) 10  MEQ tablet TAKE ONE TABLET BY MOUTH ONCE DAILY 30 tablet 4   No current facility-administered medications on file prior to visit.    Review of Systems Constitutional: Negative for other unusual diaphoresis, sweats, appetite or weight changes HENT: Negative for other worsening hearing loss, ear pain, facial swelling, mouth sores or neck stiffness.   Eyes: Negative for other worsening pain, redness or other visual disturbance.  Respiratory:  Negative for other stridor or swelling Cardiovascular: Negative for other palpitations or other chest pain  Gastrointestinal: Negative for worsening diarrhea or loose stools, blood in stool, distention or other pain Genitourinary: Negative for hematuria, flank pain or other change in urine volume.  Musculoskeletal: Negative for myalgias or other joint swelling.  Skin: Negative for other color change, or other wound or worsening drainage.  Neurological: Negative for other syncope or numbness. Hematological: Negative for other adenopathy or swelling Psychiatric/Behavioral: Negative for hallucinations, other worsening agitation, SI, self-injury, or new decreased concentration All other system neg per pt    Objective:   Physical Exam BP (!) 164/80   Pulse 83   Temp 98.3 F (36.8 C) (Oral)   Ht 5\' 7"  (1.702 m)   Wt 170 lb (77.1 kg)   SpO2 98%   BMI 26.63 kg/m  VS noted,  Constitutional: Pt is oriented to person, place, and time. Appears well-developed and well-nourished, in no significant distress and comfortable Head: Normocephalic and atraumatic  Eyes: Conjunctivae and EOM are normal. Pupils are equal, round, and reactive to light Right Ear: External ear normal without discharge Left Ear: External ear normal without discharge Nose: Nose without discharge or deformity Mouth/Throat: Oropharynx is without other ulcerations and moist  Neck: Normal range of motion. Neck supple. No JVD present. No tracheal deviation present or significant neck LA or mass Cardiovascular: Normal rate, regular rhythm, normal heart sounds and intact distal pulses.  except for 2/6 Sys murmur LUSB and RUSB Pulmonary/Chest: WOB normal and breath sounds without rales or wheezing  Abdominal: Soft. Bowel sounds are normal. NT. No HSM  Musculoskeletal: Normal range of motion. Exhibits no edema Lymphadenopathy: Has no other cervical adenopathy.  Neurological: Pt is alert and oriented to person, place, and time. Pt has  normal reflexes. No cranial nerve deficit. Motor grossly intact, Gait intact Skin: Skin is warm and dry. No rash noted or new ulcerations Psychiatric:  Has normal mood and affect. Behavior is normal without agitation No other exam findings  ECG today I have personally interpreted Sinus  Rhythm  - occasional PAC   - WNL    Assessment & Plan:

## 2016-11-24 NOTE — Assessment & Plan Note (Signed)
asympt, but new to my exam, will ask for echo - r/o AS

## 2016-11-24 NOTE — Assessment & Plan Note (Signed)
Mild uncontrolled, to incresae the lipitor to 20 qd

## 2016-11-24 NOTE — Assessment & Plan Note (Signed)
Mild uncontrolled, to incresae the diovan HCT to 320/25 qd, f/u bp a home and next visit

## 2016-11-24 NOTE — Assessment & Plan Note (Signed)
asympt, for f/u a1c today

## 2016-11-24 NOTE — Assessment & Plan Note (Signed)
With new oonset but typical neuritic pain worse at night, declines formal testing, will start elavil 50 qhs

## 2016-11-24 NOTE — Assessment & Plan Note (Signed)
?   Clinical significant, mild only with last labs, for f/u lab today Lab Results  Component Value Date   WBC 8.7 11/25/2015   HGB 13.2 11/25/2015   HCT 38.6 11/25/2015   MCV 88.2 11/25/2015   PLT 523.0 (H) 11/25/2015

## 2016-11-24 NOTE — Patient Instructions (Addendum)
Your EKG was Ok today  Please take all new medication as prescribed - the elavil for pain at night  OK to increase the diovan HCT to 320-25 mg per day  OK to increase the lipitor to 20 mg per day  Please continue all other medications as before, and refills have been done if requested.  Please have the pharmacy call with any other refills you may need.  Please continue your efforts at being more active, low cholesterol diet, and weight control.  You are otherwise up to date with prevention measures today.  Please keep your appointments with your specialists as you may have planned  Please go to the LAB in the Basement (turn left off the elevator) for the tests to be done today  You will be contacted by phone if any changes need to be made immediately.  Otherwise, you will receive a letter about your results with an explanation, but please check with MyChart first.  Please remember to sign up for MyChart if you have not done so, as this will be important to you in the future with finding out test results, communicating by private email, and scheduling acute appointments online when needed.  If you have Medicare related insurance (such as traditional Medicare, [employer] or [employer], or similar), Please make an appointment at the [facility] with Sharee Pimple, the [employer], for your Wellness Visit in this office, which is a benefit with your insurance.  Please return in 1 year for your yearly visit, or sooner if needed

## 2016-11-24 NOTE — Progress Notes (Signed)
Pre visit review using our clinic review tool, if applicable. No additional management support is needed unless otherwise documented below in the visit note. 

## 2016-12-01 ENCOUNTER — Telehealth (HOSPITAL_COMMUNITY): Payer: Self-pay | Admitting: Internal Medicine

## 2016-12-08 ENCOUNTER — Other Ambulatory Visit: Payer: Self-pay

## 2016-12-08 ENCOUNTER — Ambulatory Visit (HOSPITAL_COMMUNITY): Payer: Medicare Other | Attending: Cardiology

## 2016-12-08 DIAGNOSIS — R011 Cardiac murmur, unspecified: Secondary | ICD-10-CM | POA: Diagnosis not present

## 2016-12-08 DIAGNOSIS — I340 Nonrheumatic mitral (valve) insufficiency: Secondary | ICD-10-CM | POA: Diagnosis not present

## 2016-12-08 DIAGNOSIS — I350 Nonrheumatic aortic (valve) stenosis: Secondary | ICD-10-CM | POA: Diagnosis not present

## 2016-12-08 DIAGNOSIS — I700 Atherosclerosis of aorta: Secondary | ICD-10-CM | POA: Insufficient documentation

## 2016-12-09 NOTE — Telephone Encounter (Signed)
12/01/2016 09:45 AM Phone (Outgoing) Cyndi Lennert D (Self) [phone] (M)   Left Message - Called pt and lmsg for her to CB and sch echo.     By Verdene Rio

## 2017-01-12 DIAGNOSIS — H25011 Cortical age-related cataract, right eye: Secondary | ICD-10-CM | POA: Diagnosis not present

## 2017-01-12 DIAGNOSIS — H26492 Other secondary cataract, left eye: Secondary | ICD-10-CM | POA: Diagnosis not present

## 2017-01-12 DIAGNOSIS — H2511 Age-related nuclear cataract, right eye: Secondary | ICD-10-CM | POA: Diagnosis not present

## 2017-01-12 DIAGNOSIS — I10 Essential (primary) hypertension: Secondary | ICD-10-CM | POA: Diagnosis not present

## 2017-01-19 DIAGNOSIS — H26492 Other secondary cataract, left eye: Secondary | ICD-10-CM | POA: Diagnosis not present

## 2017-02-28 ENCOUNTER — Telehealth: Payer: Self-pay | Admitting: Internal Medicine

## 2017-02-28 NOTE — Telephone Encounter (Signed)
Patient Name: Julia Sanchez  DOB: Sep 04, 1945    Initial Comment Caller says, may have been exposed to scabies in the last few days. No current Sx.    Nurse Assessment  Nurse: Verlin Fester RN, Stanton Kidney Date/Time Eilene Ghazi Time): 02/28/2017 9:32:25 AM  Confirm and document reason for call. If symptomatic, describe symptoms. ---Caller states patient has scabies and she is concerned  Does the patient have any new or worsening symptoms? ---Yes  Will a triage be completed? ---Yes  Related visit to physician within the last 2 weeks? ---No  Does the PT have any chronic conditions? (i.e. diabetes, asthma, etc.) ---No  Is this a behavioral health or substance abuse call? ---No    Nurse: Verlin Fester, RN, Stanton Kidney Date/Time (Eastern Time): 02/28/2017 9:36:56 AM  Please select the assessment type ---Standing order   Other current medications? ---Yes   List current medications. ---blood pressure medicine   Medication allergies? ---Yes   List medication allergies. ---Sulfa   Pharmacy name and phone number. ---[facility] [phone]      Guidelines    Guideline Title Affirmed Question Affirmed Notes  Scabies Exposure [1] EXPOSURE (close contact) to person with known scabies AND [2] has not been treated    Final Disposition User   Call PCP when Office is Open Verlin Fester, RN, Stanton Kidney

## 2017-05-17 ENCOUNTER — Other Ambulatory Visit: Payer: Self-pay | Admitting: Internal Medicine

## 2017-05-27 DIAGNOSIS — Z23 Encounter for immunization: Secondary | ICD-10-CM | POA: Diagnosis not present

## 2017-08-02 ENCOUNTER — Encounter: Payer: Self-pay | Admitting: Internal Medicine

## 2017-08-03 MED ORDER — TELMISARTAN-HCTZ 80-25 MG PO TABS: 1.0000 | ORAL_TABLET | Freq: Every day | ORAL | 3 refills | Status: DC

## 2017-10-11 DIAGNOSIS — Z779 Other contact with and (suspected) exposures hazardous to health: Secondary | ICD-10-CM | POA: Diagnosis not present

## 2017-10-11 DIAGNOSIS — Z1231 Encounter for screening mammogram for malignant neoplasm of breast: Secondary | ICD-10-CM | POA: Diagnosis not present

## 2017-10-11 DIAGNOSIS — Z6827 Body mass index (BMI) 27.0-27.9, adult: Secondary | ICD-10-CM | POA: Diagnosis not present

## 2017-11-16 ENCOUNTER — Other Ambulatory Visit: Payer: Self-pay | Admitting: Internal Medicine

## 2017-11-22 DIAGNOSIS — H2511 Age-related nuclear cataract, right eye: Secondary | ICD-10-CM | POA: Diagnosis not present

## 2017-11-22 DIAGNOSIS — H35342 Macular cyst, hole, or pseudohole, left eye: Secondary | ICD-10-CM | POA: Diagnosis not present

## 2017-11-22 DIAGNOSIS — Z961 Presence of intraocular lens: Secondary | ICD-10-CM | POA: Diagnosis not present

## 2017-11-25 ENCOUNTER — Ambulatory Visit: Payer: Medicare Other

## 2017-11-29 ENCOUNTER — Ambulatory Visit (INDEPENDENT_AMBULATORY_CARE_PROVIDER_SITE_OTHER): Payer: Medicare Other | Admitting: Internal Medicine

## 2017-11-29 ENCOUNTER — Other Ambulatory Visit (INDEPENDENT_AMBULATORY_CARE_PROVIDER_SITE_OTHER): Payer: Medicare Other

## 2017-11-29 ENCOUNTER — Encounter: Payer: Self-pay | Admitting: Internal Medicine

## 2017-11-29 VITALS — BP 124/78 | HR 92 | Temp 98.6°F | Ht 67.0 in | Wt 167.0 lb

## 2017-11-29 DIAGNOSIS — E2839 Other primary ovarian failure: Secondary | ICD-10-CM | POA: Diagnosis not present

## 2017-11-29 DIAGNOSIS — M19049 Primary osteoarthritis, unspecified hand: Secondary | ICD-10-CM | POA: Diagnosis not present

## 2017-11-29 DIAGNOSIS — I10 Essential (primary) hypertension: Secondary | ICD-10-CM | POA: Diagnosis not present

## 2017-11-29 DIAGNOSIS — R739 Hyperglycemia, unspecified: Secondary | ICD-10-CM

## 2017-11-29 DIAGNOSIS — E785 Hyperlipidemia, unspecified: Secondary | ICD-10-CM

## 2017-11-29 DIAGNOSIS — Z0000 Encounter for general adult medical examination without abnormal findings: Secondary | ICD-10-CM

## 2017-11-29 LAB — URINALYSIS, ROUTINE W REFLEX MICROSCOPIC
Bilirubin Urine: NEGATIVE
Hgb urine dipstick: NEGATIVE
KETONES UR: NEGATIVE
Leukocytes, UA: NEGATIVE
Nitrite: NEGATIVE
PH: 6 (ref 5.0–8.0)
SPECIFIC GRAVITY, URINE: 1.01 (ref 1.000–1.030)
TOTAL PROTEIN, URINE-UPE24: NEGATIVE
URINE GLUCOSE: NEGATIVE
UROBILINOGEN UA: 0.2 (ref 0.0–1.0)

## 2017-11-29 LAB — CBC WITH DIFFERENTIAL/PLATELET
Basophils Absolute: 0.1 10*3/uL (ref 0.0–0.1)
Basophils Relative: 0.8 % (ref 0.0–3.0)
EOS PCT: 7.7 % — AB (ref 0.0–5.0)
Eosinophils Absolute: 0.6 10*3/uL (ref 0.0–0.7)
HCT: 37.1 % (ref 36.0–46.0)
HEMOGLOBIN: 13 g/dL (ref 12.0–15.0)
LYMPHS PCT: 11.9 % — AB (ref 12.0–46.0)
Lymphs Abs: 0.8 10*3/uL (ref 0.7–4.0)
MCHC: 35.1 g/dL (ref 30.0–36.0)
MCV: 86.9 fl (ref 78.0–100.0)
MONOS PCT: 10.9 % (ref 3.0–12.0)
Monocytes Absolute: 0.8 10*3/uL (ref 0.1–1.0)
Neutro Abs: 4.9 10*3/uL (ref 1.4–7.7)
Neutrophils Relative %: 68.7 % (ref 43.0–77.0)
Platelets: 318 10*3/uL (ref 150.0–400.0)
RBC: 4.27 Mil/uL (ref 3.87–5.11)
RDW: 13 % (ref 11.5–15.5)
WBC: 7.1 10*3/uL (ref 4.0–10.5)

## 2017-11-29 LAB — HEPATIC FUNCTION PANEL
ALT: 17 U/L (ref 0–35)
AST: 16 U/L (ref 0–37)
Albumin: 4.6 g/dL (ref 3.5–5.2)
Alkaline Phosphatase: 112 U/L (ref 39–117)
Bilirubin, Direct: 0.1 mg/dL (ref 0.0–0.3)
Total Bilirubin: 0.4 mg/dL (ref 0.2–1.2)
Total Protein: 7.3 g/dL (ref 6.0–8.3)

## 2017-11-29 LAB — LIPID PANEL
CHOLESTEROL: 145 mg/dL (ref 0–200)
HDL: 56.7 mg/dL (ref >39.00)
LDL CALC: 69 mg/dL (ref 0–99)
NonHDL: 88.59
Total CHOL/HDL Ratio: 3
Triglycerides: 96 mg/dL (ref 0.0–149.0)
VLDL: 19.2 mg/dL (ref 0.0–40.0)

## 2017-11-29 LAB — BASIC METABOLIC PANEL
BUN: 13 mg/dL (ref 6–23)
CO2: 29 mEq/L (ref 19–32)
Calcium: 9.7 mg/dL (ref 8.4–10.5)
Chloride: 98 mEq/L (ref 96–112)
Creatinine, Ser: 0.69 mg/dL (ref 0.40–1.20)
GFR: 88.4 mL/min (ref >60.00)
Glucose, Bld: 121 mg/dL — ABNORMAL HIGH (ref 70–99)
POTASSIUM: 3.6 meq/L (ref 3.5–5.1)
SODIUM: 136 meq/L (ref 135–145)

## 2017-11-29 LAB — TSH: TSH: 1.38 u[IU]/mL (ref 0.35–4.50)

## 2017-11-29 LAB — HEMOGLOBIN A1C: Hgb A1c MFr Bld: 6.2 % (ref 4.6–6.5)

## 2017-11-29 MED ORDER — AMITRIPTYLINE HCL 50 MG PO TABS: 50.0000 mg | ORAL_TABLET | Freq: Every day | ORAL | 3 refills | Status: DC

## 2017-11-29 MED ORDER — ATORVASTATIN CALCIUM 20 MG PO TABS: 20.0000 mg | ORAL_TABLET | Freq: Every day | ORAL | 3 refills | Status: DC

## 2017-11-29 MED ORDER — AMLODIPINE BESYLATE 10 MG PO TABS: 10.0000 mg | ORAL_TABLET | Freq: Every day | ORAL | 3 refills | Status: DC

## 2017-11-29 MED ORDER — POTASSIUM CHLORIDE ER 10 MEQ PO TBCR: EXTENDED_RELEASE_TABLET | ORAL | 3 refills | Status: DC

## 2017-11-29 MED ORDER — TELMISARTAN-HCTZ 80-25 MG PO TABS: 1.0000 | ORAL_TABLET | Freq: Every day | ORAL | 3 refills | Status: DC

## 2017-11-29 MED ORDER — DICLOFENAC SODIUM 1 % TD GEL
4.0000 g | Freq: Four times a day (QID) | TRANSDERMAL | 5 refills | Status: DC | PRN
Start: 2017-11-29 — End: 2019-01-17

## 2017-11-29 MED ORDER — IBUPROFEN 800 MG PO TABS: 800.0000 mg | ORAL_TABLET | Freq: Three times a day (TID) | ORAL | 1 refills | Status: DC | PRN

## 2017-11-29 NOTE — Progress Notes (Signed)
Subjective:    Patient ID: Julia Sanchez, female    DOB: 1946/07/05, 72 y.o.   MRN: [id number]  HPI     Here for yearly f/u;  Overall doing ok;  Pt denies Chest pain, worsening SOB, DOE, wheezing, orthopnea, PND, worsening LE edema, palpitations, dizziness or syncope.  Pt denies neurological change such as new headache, facial or extremity weakness.  Pt denies polydipsia, polyuria, or low sugar symptoms. Pt states overall good compliance with treatment and medications, good tolerability, and has been trying to follow appropriate diet.  Pt denies worsening depressive symptoms, suicidal ideation or panic. No fever, night sweats, wt loss, loss of appetite, or other constitutional symptoms.  Pt states good ability with ADL's, has low fall risk, home safety reviewed and adequate, no other significant changes in hearing or vision, and only occasionally active with exercise.  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    Wt Readings from Last 3 Encounters:  11/29/17 167 lb (75.8 kg)  11/24/16 170 lb (77.1 kg)  05/07/16 168 lb 6.4 oz (76.4 kg)   Past Medical History:  Diagnosis Date  . Anxiety    due to stress - no stress  . DIVERTICULOSIS, COLON 04/07/2009  . HYPERLIPIDEMIA 05/03/2007  . HYPERTENSION 05/03/2007  . SVD (spontaneous vaginal delivery)    x 2   Past Surgical History:  Procedure Laterality Date  . COLONOSCOPY    . CYSTOSCOPY    . EYE SURGERY  05/2012   macular hole - left  . EYE SURGERY  2014   cataract removed left eye  . HYSTEROSCOPY W/D&C  03/06/2013   Procedure: DILATATION AND CURETTAGE /HYSTEROSCOPY;  Surgeon: Margarette Asal, MD;  Location: [facility];  Service: Gynecology;;  . LAPAROSCOPIC ASSISTED VAGINAL HYSTERECTOMY N/A 06/27/2014   Procedure: LAPAROSCOPIC ASSISTED VAGINAL HYSTERECTOMY WITH BILATERAL SALPINGO OOPHORECTOMY;  Surgeon: Margarette Asal, MD;  Location: [facility];  Service: Gynecology;  Laterality: N/A;    . SALPINGOOPHORECTOMY Bilateral 06/27/2014   Procedure: SALPINGO OOPHORECTOMY;  Surgeon: Margarette Asal, MD;  Location: [facility];  Service: Gynecology;  Laterality: Bilateral;  . TONSILLECTOMY    . WISDOM TOOTH EXTRACTION      reports that she has never smoked. She has never used smokeless tobacco. She reports that she drinks alcohol. She reports that she does not use drugs. family history includes Breast cancer in her maternal aunt; Coronary artery disease in her father; Diabetes in her cousin and maternal uncle; Heart disease in her father. Allergies  Allergen Reactions  . Nitrofurantoin Nausea And Vomiting  . Sulfonamide Derivatives Nausea And Vomiting   Current Outpatient Medications on File Prior to Visit  Medication Sig Dispense Refill  . aspirin EC 81 MG tablet Take 1 tablet (81 mg total) by mouth daily. 90 tablet 11   No current facility-administered medications on file prior to visit.    Review of Systems Constitutional: Negative for other unusual diaphoresis, sweats, appetite or weight changes HENT: Negative for other worsening hearing loss, ear pain, facial swelling, mouth sores or neck stiffness.   Eyes: Negative for other worsening pain, redness or other visual disturbance.  Respiratory: Negative for other stridor or swelling Cardiovascular: Negative for other palpitations or other chest pain  Gastrointestinal: Negative for worsening diarrhea or loose stools, blood in stool, distention or other pain Genitourinary: Negative for hematuria, flank pain or other change in urine volume.  Musculoskeletal: Negative for  myalgias or other joint swelling.  Skin: Negative for other color change, or other wound or worsening drainage.  Neurological: Negative for other syncope or numbness. Hematological: Negative for other adenopathy or swelling Psychiatric/Behavioral: Negative for hallucinations, other worsening agitation, SI, self-injury, or new decreased concentration All other  system neg per pt    Objective:   Physical Exam BP 124/78   Pulse 92   Temp 98.6 F (37 C) (Oral)   Ht 5\' 7"  (1.702 m)   Wt 167 lb (75.8 kg)   SpO2 98%   BMI 26.16 kg/m  VS noted,  Constitutional: Pt is oriented to person, place, and time. Appears well-developed and well-nourished, in no significant distress and comfortable Head: Normocephalic and atraumatic  Eyes: Conjunctivae and EOM are normal. Pupils are equal, round, and reactive to light Right Ear: External ear normal without discharge Left Ear: External ear normal without discharge Nose: Nose without discharge or deformity Mouth/Throat: Oropharynx is without other ulcerations and moist  Neck: Normal range of motion. Neck supple. No JVD present. No tracheal deviation present or significant neck LA or mass Cardiovascular: Normal rate, regular rhythm, normal heart sounds and intact distal pulses.   Pulmonary/Chest: WOB normal and breath sounds without rales or wheezing  Abdominal: Soft. Bowel sounds are normal. NT. No HSM  Musculoskeletal: Normal range of motion. Exhibits no edema Lymphadenopathy: Has no other cervical adenopathy.  Neurological: Pt is alert and oriented to person, place, and time. Pt has normal reflexes. No cranial nerve deficit. Motor grossly intact, Gait intact Skin: Skin is warm and dry. No rash noted or new ulcerations Psychiatric:  Has normal mood and affect. Behavior is normal without agitation No other exam findings Lab Results  Component Value Date   WBC 7.3 11/24/2016   HGB 13.9 11/24/2016   HCT 40.9 11/24/2016   PLT 391.0 11/24/2016   GLUCOSE 121 (H) 11/24/2016   CHOL 204 (H) 11/24/2016   TRIG 104.0 11/24/2016   HDL 80.10 11/24/2016   LDLDIRECT 128.0 02/17/2010   LDLCALC 103 (H) 11/24/2016   ALT 22 11/24/2016   AST 18 11/24/2016   NA 137 11/24/2016   K 3.5 11/24/2016   CL 100 11/24/2016   CREATININE 0.71 11/24/2016   BUN 14 11/24/2016   CO2 26 11/24/2016   TSH 1.59 11/24/2016   HGBA1C  6.2 11/24/2016       Assessment & Plan:

## 2017-11-29 NOTE — Patient Instructions (Addendum)
Please schedule the bone density test before leaving today at the scheduling desk (where you check out)  Please take all new medication as prescribed - the voltaren gel for the hand arthritis as needed  Please continue all other medications as before, and refills have been done if requested.  Please have the pharmacy call with any other refills you may need.  Please continue your efforts at being more active, low cholesterol diet, and weight control.  You are otherwise up to date with prevention measures today.  Please keep your appointments with your specialists as you may have planned  Please go to the LAB in the Basement (turn left off the elevator) for the tests to be done today  You will be contacted by phone if any changes need to be made immediately.  Otherwise, you will receive a letter about your results with an explanation, but please check with MyChart first.  Please remember to sign up for MyChart if you have not done so, as this will be important to you in the future with finding out test results, communicating by private email, and scheduling acute appointments online when needed.  Please return in 1 year for your yearly visit, or sooner if needed

## 2017-11-29 NOTE — Assessment & Plan Note (Signed)
BP Readings from Last 3 Encounters:  11/29/17 124/78  11/24/16 (!) 164/80  05/07/16 (!) 160/90  stable overall by history and exam, recent data reviewed with pt, and pt to continue medical treatment as before,  to f/u any worsening symptoms or concerns

## 2017-11-29 NOTE — Assessment & Plan Note (Signed)
Mild, goal ldl < 100, cont diet., for lipid today

## 2017-11-29 NOTE — Assessment & Plan Note (Signed)
stable overall by history and exam, recent data reviewed with pt, and pt to continue medical treatment as before,  to f/u any worsening symptoms or concerns  

## 2017-11-29 NOTE — Addendum Note (Signed)
Addended by: Juliet Rude on: 11/29/2017 10:25 AM   Modules accepted: Orders

## 2017-12-08 ENCOUNTER — Other Ambulatory Visit: Payer: Medicare Other

## 2017-12-09 ENCOUNTER — Ambulatory Visit (INDEPENDENT_AMBULATORY_CARE_PROVIDER_SITE_OTHER)
Admission: RE | Admit: 2017-12-09 | Discharge: 2017-12-09 | Disposition: A | Discharge status: Home / Self Care | Payer: Medicare Other | Source: Ambulatory Visit | Attending: Internal Medicine | Admitting: Internal Medicine

## 2017-12-09 DIAGNOSIS — E2839 Other primary ovarian failure: Secondary | ICD-10-CM | POA: Diagnosis not present

## 2017-12-14 NOTE — Progress Notes (Addendum)
Subjective:   Julia Sanchez is a 72 y.o. female who presents for Medicare Annual (Subsequent) preventive examination.  Review of Systems:  No ROS.  Medicare Wellness Visit. Additional risk factors are reflected in the social history.    Sleep patterns: no sleep issues, feels rested on waking, gets up 1 times nightly to void and sleeps 7-8 hours nightly.    Home Safety/Smoke Alarms: Feels safe in home. Smoke alarms in place.  Living environment; residence and Firearm Safety: 2-story house, no firearms.Lives with family, no needs for DME, good support system Seat Belt Safety/Bike Helmet: Wears seat belt.     Objective:     Vitals: There were no vitals taken for this visit.  There is no height or weight on file to calculate BMI.  Advanced Directives 06/27/2014 06/20/2014 02/28/2013  Does Patient Have a Medical Advance Directive? Yes Yes Patient has advance directive, copy not in chart  Type of Advance Directive Julia Sanchez Julia Sanchez -  Does patient want to make changes to medical advance directive? No - Patient declined No - Patient declined -  Copy of Julia Sanchez in Chart? No - copy requested No - copy requested -    Tobacco Social History   Tobacco Use  Smoking Status Never Smoker  Smokeless Tobacco Never Used     Counseling given: Not Answered   Past Medical History:  Diagnosis Date  . Anxiety    due to stress - no stress  . DIVERTICULOSIS, COLON 04/07/2009  . HYPERLIPIDEMIA 05/03/2007  . HYPERTENSION 05/03/2007  . SVD (spontaneous vaginal delivery)    x 2   Past Surgical History:  Procedure Laterality Date  . COLONOSCOPY    . CYSTOSCOPY    . EYE SURGERY  05/2012   macular hole - left  . EYE SURGERY  2014   cataract removed left eye  . HYSTEROSCOPY W/D&C  03/06/2013   Procedure: DILATATION AND CURETTAGE /HYSTEROSCOPY;  Surgeon: Margarette Asal,  MD;  Location: [facility];  Service: Gynecology;;  . LAPAROSCOPIC ASSISTED VAGINAL HYSTERECTOMY N/A 06/27/2014   Procedure: LAPAROSCOPIC ASSISTED VAGINAL HYSTERECTOMY WITH BILATERAL SALPINGO OOPHORECTOMY;  Surgeon: Margarette Asal, MD;  Location: [facility];  Service: Gynecology;  Laterality: N/A;  . SALPINGOOPHORECTOMY Bilateral 06/27/2014   Procedure: SALPINGO OOPHORECTOMY;  Surgeon: Margarette Asal, MD;  Location: [facility];  Service: Gynecology;  Laterality: Bilateral;  . TONSILLECTOMY    . WISDOM TOOTH EXTRACTION     Family History  Problem Relation Age of Onset  . Coronary artery disease Father        Hx MI  . Heart disease Father        CAD/MI- fatal  . Breast cancer Maternal Aunt   . Diabetes Maternal Uncle   . Diabetes Cousin    Social History   Socioeconomic History  . Marital status: Married    Spouse name: Not on file  . Number of children: 2  . Years of education: 105  . Highest education level: Not on file  Occupational History  . 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 Financial resource strain: Not on file  . Food insecurity:    Worry: Not on file    Inability: Not on file  . Transportation needs:    Medical: Not on file    Non-medical: Not on file  Tobacco Use  . Smoking status: Never Smoker  .  Smokeless tobacco: Never Used  Substance and Sexual Activity  . Alcohol use: Yes    Comment: occassional  . Drug use: No  . Sexual activity: Yes    Partners: Male    Birth control/protection: None, Post-menopausal  Lifestyle  . Physical activity:    Days per week: Not on file    Minutes per session: Not on file  . Stress: Not on file  Relationships  . Social connections:    Talks on phone: Not on file    Gets together: Not on file    Attends religious service: Not on file    Active member of club or organization: Not on file    Attends meetings of clubs or organizations: Not on file    Relationship status: Not on file  Other Topics  Concern  . 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 Marriage is in good Sanchez. Lots of family stress.     Outpatient Encounter Medications as of 12/15/2017  Medication Sig  . amitriptyline (ELAVIL) 50 MG tablet Take 1 tablet (50 mg total) by mouth at bedtime.  Marland Kitchen amLODipine (NORVASC) 10 MG tablet Take 1 tablet (10 mg total) by mouth daily.  Marland Kitchen aspirin EC 81 MG tablet Take 1 tablet (81 mg total) by mouth daily.  Marland Kitchen atorvastatin (LIPITOR) 20 MG tablet Take 1 tablet (20 mg total) by mouth daily.  . diclofenac sodium (VOLTAREN) 1 % GEL Apply 4 g topically 4 (four) times daily as needed.  Marland Kitchen ibuprofen (ADVIL,MOTRIN) 800 MG tablet Take 1 tablet (800 mg total) by mouth every 8 (eight) hours as needed for moderate pain (mild pain).  . potassium chloride (K-DUR) 10 MEQ tablet TAKE 1 TABLET (10 MEQ TOTAL) BY MOUTH DAILY.  Marland Kitchen telmisartan-hydrochlorothiazide (MICARDIS HCT) 80-25 MG tablet Take 1 tablet by mouth daily.   No facility-administered encounter medications on file as of 12/15/2017.     Activities of Daily Living No flowsheet data found.  Patient Care Team: Biagio Borg, MD as PCP - General (Internal Medicine) Molli Posey, MD (Obstetrics and Gynecology) Sable Feil, MD (Gastroenterology) Excell Seltzer, MD (General Surgery) Remer Macho, MD (Ophthalmology)    Assessment:   This is a routine wellness examination for Julia Sanchez. Physical assessment deferred to PCP.   Exercise Activities and Dietary recommendations   Diet (meal preparation, eat out, water intake, caffeinated beverages, dairy products, fruits and vegetables): in general, a "healthy" diet  , well balanced, eats a variety of fruits and vegetables daily, limits salt, fat/cholesterol, sugar,carbohydrates,caffeine,  drinks 6-8 glasses of water daily.  Discussed weight loss strategies. Diet education was provided via handout.  Goals    None      Fall Risk Fall Risk  11/29/2017 11/24/2016 11/25/2015 11/19/2014  Falls in the past year? No No No No    Depression Screen PHQ 2/9 Scores 11/29/2017 11/24/2016 11/25/2015 11/19/2014  PHQ - 2 Score 0 0 0 0  PHQ- 9 Score 0 - - -     Cognitive Function       Ad8 score reviewed for issues:  Issues making decisions: no  Less interest in hobbies / activities: no  Repeats questions, stories (family complaining): no  Trouble using ordinary gadgets (microwave, computer, phone):no  Forgets the month or year: no  Mismanaging finances: no  Remembering appts: no  Daily problems with thinking and/or memory: no Ad8 score is= 0  Immunization History  Administered Date(s) Administered  . Influenza Split 04/23/2011  . Influenza Whole 05/13/2009, 06/09/2012  . Influenza, High Dose Seasonal PF 06/03/2016  . Influenza,inj,Quad PF,6+ Mos 06/28/2014  . Influenza-Unspecified 05/27/2017  . Pneumococcal Conjugate-13 10/24/2013  . Pneumococcal Polysaccharide-23 11/20/2008, 01/02/2014  . Td 10/11/2004, 11/19/2014   Screening Tests Sanchez Maintenance  Topic Date Due  . INFLUENZA VACCINE  03/09/2018  . MAMMOGRAM  07/09/2018  . COLONOSCOPY  06/24/2019  . TETANUS/TDAP  11/18/2024  . DEXA SCAN  Completed  . Hepatitis C Screening  Completed  . PNA vac Low Risk Adult  Completed      Plan:    Continue doing brain stimulating activities (puzzles, reading, adult coloring books, staying active) to keep memory sharp.   Continue to eat heart healthy diet (full of fruits, vegetables, whole grains, lean protein, water--limit salt, fat, and sugar intake) and increase physical activity as tolerated.  I have personally reviewed and noted the following in the patient's chart:   . Medical and social history . Use of alcohol, tobacco or illicit drugs  . Current  medications and supplements . Functional ability and status . Nutritional status . Physical activity . Advanced directives . List of other physicians . Vitals . Screenings to include cognitive, depression, and falls . Referrals and appointments  In addition, I have reviewed and discussed with patient certain preventive protocols, quality metrics, and best practice recommendations. A written personalized care plan for preventive services as well as general preventive Sanchez recommendations were provided to patient.     Michiel Cowboy, RN  12/14/2017   Medical screening examination/treatment/procedure(s) were performed by non-physician practitioner and as supervising physician I was immediately available for consultation/collaboration. I agree with above. Cathlean Cower, MD

## 2017-12-15 ENCOUNTER — Ambulatory Visit (INDEPENDENT_AMBULATORY_CARE_PROVIDER_SITE_OTHER): Payer: Medicare Other | Admitting: *Deleted

## 2017-12-15 VITALS — BP 128/68 | HR 81 | Resp 18 | Ht 67.0 in | Wt 167.0 lb

## 2017-12-15 DIAGNOSIS — Z0000 Encounter for general adult medical examination without abnormal findings: Secondary | ICD-10-CM

## 2017-12-15 NOTE — Patient Instructions (Addendum)
Continue doing brain stimulating activities (puzzles, reading, adult coloring books, staying active) to keep memory sharp.   Continue to eat heart healthy diet (full of fruits, vegetables, whole grains, lean protein, water--limit salt, fat, and sugar intake) and increase physical activity as tolerated.   Julia Sanchez , Thank you for taking time to come for your Medicare Wellness Visit. I appreciate your ongoing commitment to your health goals. Please review the following plan we discussed and let me know if I can assist you in the future.   These are the goals we discussed: Goals    . Patient Stated     I want to lose 10 pounds by monitoring my diet for carbohydrates, sugar, and fat. I will continue to be active and enjoy life and family.       This is a list of the screening recommended for you and due dates:  Health Maintenance  Topic Date Due  . Flu Shot  03/09/2018  . Mammogram  07/09/2018  . Colon Cancer Screening  06/24/2019  . Tetanus Vaccine  11/18/2024  . DEXA scan (bone density measurement)  Completed  .  Hepatitis C: One time screening is recommended by Center for Disease Control  (CDC) for  adults born from [birth year] through 1967.   Completed  . Pneumonia vaccines  Completed   Health Maintenance, Female Adopting a healthy lifestyle and getting preventive care can go a long way to promote health and wellness. Talk with your health care provider about what schedule of regular examinations is right for you. This is a good chance for you to check in with your provider about disease prevention and staying healthy. In between checkups, there are plenty of things you can do on your own. Experts have done a lot of research about which lifestyle changes and preventive measures are most likely to keep you healthy. Ask your health care provider for more information. Weight and diet Eat a healthy diet  Be sure to include plenty of vegetables, fruits, low-fat dairy products, and lean  protein.  Do not eat a lot of foods high in solid fats, added sugars, or salt.  Get regular exercise. This is one of the most important things you can do for your health. ? Most adults should exercise for at least 150 minutes each week. The exercise should increase your heart rate and make you sweat (moderate-intensity exercise). ? Most adults should also do strengthening exercises at least twice a week. This is in addition to the moderate-intensity exercise.  Maintain a healthy weight  Body mass index (BMI) is a measurement that can be used to identify possible weight problems. It estimates body fat based on height and weight. Your health care provider can help determine your BMI and help you achieve or maintain a healthy weight.  For females 38 years of age and older: ? A BMI below 18.5 is considered underweight. ? A BMI of 18.5 to 24.9 is normal. ? A BMI of 25 to 29.9 is considered overweight. ? A BMI of 30 and above is considered obese.  Watch levels of cholesterol and blood lipids  You should start having your blood tested for lipids and cholesterol at 74 years of age, then have this test every 5 years.  You may need to have your cholesterol levels checked more often if: ? Your lipid or cholesterol levels are high. ? You are older than 74 years of age. ? You are at high risk for heart disease.  Cancer screening  Lung Cancer  Lung cancer screening is recommended for adults 54-50 years old who are at high risk for lung cancer because of a history of smoking.  A yearly low-dose CT scan of the lungs is recommended for people who: ? Currently smoke. ? Have quit within the past 15 years. ? Have at least a 30-pack-year history of smoking. A pack year is smoking an average of one pack of cigarettes a day for 1 year.  Yearly screening should continue until it has been 15 years since you quit.  Yearly screening should stop if you develop a health problem that would prevent you from  having lung cancer treatment.  Breast Cancer  Practice breast self-awareness. This means understanding how your breasts normally appear and feel.  It also means doing regular breast self-exams. Let your health care provider know about any changes, no matter how small.  If you are in your 20s or 30s, you should have a clinical breast exam (CBE) by a health care provider every 1-3 years as part of a regular health exam.  If you are 110 or older, have a CBE every year. Also consider having a breast X-ray (mammogram) every year.  If you have a family history of breast cancer, talk to your health care provider about genetic screening.  If you are at high risk for breast cancer, talk to your health care provider about having an MRI and a mammogram every year.  Breast cancer gene (BRCA) assessment is recommended for women who have family members with BRCA-related cancers. BRCA-related cancers include: ? Breast. ? Ovarian. ? Tubal. ? Peritoneal cancers.  Results of the assessment will determine the need for genetic counseling and BRCA1 and BRCA2 testing.  Cervical Cancer Your health care provider may recommend that you be screened regularly for cancer of the pelvic organs (ovaries, uterus, and vagina). This screening involves a pelvic examination, including checking for microscopic changes to the surface of your cervix (Pap test). You may be encouraged to have this screening done every 3 years, beginning at age 42.  For women ages 32-65, health care providers may recommend pelvic exams and Pap testing every 3 years, or they may recommend the Pap and pelvic exam, combined with testing for human papilloma virus (HPV), every 5 years. Some types of HPV increase your risk of cervical cancer. Testing for HPV may also be done on women of any age with unclear Pap test results.  Other health care providers may not recommend any screening for nonpregnant women who are considered low risk for pelvic cancer  and who do not have symptoms. Ask your health care provider if a screening pelvic exam is right for you.  If you have had past treatment for cervical cancer or a condition that could lead to cancer, you need Pap tests and screening for cancer for at least 20 years after your treatment. If Pap tests have been discontinued, your risk factors (such as having a new sexual partner) need to be reassessed to determine if screening should resume. Some women have medical problems that increase the chance of getting cervical cancer. In these cases, your health care provider may recommend more frequent screening and Pap tests.  Colorectal Cancer  This type of cancer can be detected and often prevented.  Routine colorectal cancer screening usually begins at 74 years of age and continues through 74 years of age.  Your health care provider may recommend screening at an earlier age if you have risk factors for colon  cancer.  Your health care provider may also recommend using home test kits to check for hidden blood in the stool.  A small camera at the end of a tube can be used to examine your colon directly (sigmoidoscopy or colonoscopy). This is done to check for the earliest forms of colorectal cancer.  Routine screening usually begins at age 55.  Direct examination of the colon should be repeated every 5-10 years through 74 years of age. However, you may need to be screened more often if early forms of precancerous polyps or small growths are found.  Skin Cancer  Check your skin from head to toe regularly.  Tell your health care provider about any new moles or changes in moles, especially if there is a change in a mole's shape or color.  Also tell your health care provider if you have a mole that is larger than the size of a pencil eraser.  Always use sunscreen. Apply sunscreen liberally and repeatedly throughout the day.  Protect yourself by wearing long sleeves, pants, a wide-brimmed hat, and  sunglasses whenever you are outside.  Heart disease, diabetes, and high blood pressure  High blood pressure causes heart disease and increases the risk of stroke. High blood pressure is more likely to develop in: ? People who have blood pressure in the high end of the normal range (130-139/85-89 mm Hg). ? People who are overweight or obese. ? People who are African American.  If you are 52-58 years of age, have your blood pressure checked every 3-5 years. If you are 38 years of age or older, have your blood pressure checked every year. You should have your blood pressure measured twice-once when you are at a hospital or clinic, and once when you are not at a hospital or clinic. Record the average of the two measurements. To check your blood pressure when you are not at a hospital or clinic, you can use: ? An automated blood pressure machine at a pharmacy. ? A home blood pressure monitor.  If you are between 36 years and 97 years old, ask your health care provider if you should take aspirin to prevent strokes.  Have regular diabetes screenings. This involves taking a blood sample to check your fasting blood sugar level. ? If you are at a normal weight and have a low risk for diabetes, have this test once every three years after 74 years of age. ? If you are overweight and have a high risk for diabetes, consider being tested at a younger age or more often. Preventing infection Hepatitis B  If you have a higher risk for hepatitis B, you should be screened for this virus. You are considered at high risk for hepatitis B if: ? You were born in a country where hepatitis B is common. Ask your health care provider which countries are considered high risk. ? Your parents were born in a high-risk country, and you have not been immunized against hepatitis B (hepatitis B vaccine). ? You have HIV or AIDS. ? You use needles to inject street drugs. ? You live with someone who has hepatitis B. ? You have  had sex with someone who has hepatitis B. ? You get hemodialysis treatment. ? You take certain medicines for conditions, including cancer, organ transplantation, and autoimmune conditions.  Hepatitis C  Blood testing is recommended for: ? Everyone born from [birth year] through 1967. ? Anyone with known risk factors for hepatitis C.  Sexually transmitted infections (STIs)  You should  be screened for sexually transmitted infections (STIs) including gonorrhea and chlamydia if: ? You are sexually active and are younger than 74 years of age. ? You are older than 74 years of age and your health care provider tells you that you are at risk for this type of infection. ? Your sexual activity has changed since you were last screened and you are at an increased risk for chlamydia or gonorrhea. Ask your health care provider if you are at risk.  If you do not have HIV, but are at risk, it may be recommended that you take a prescription medicine daily to prevent HIV infection. This is called pre-exposure prophylaxis (PrEP). You are considered at risk if: ? You are sexually active and do not regularly use condoms or know the HIV status of your partner(s). ? You take drugs by injection. ? You are sexually active with a partner who has HIV.  Talk with your health care provider about whether you are at high risk of being infected with HIV. If you choose to begin PrEP, you should first be tested for HIV. You should then be tested every 3 months for as long as you are taking PrEP. Pregnancy  If you are premenopausal and you may become pregnant, ask your health care provider about preconception counseling.  If you may become pregnant, take 400 to 800 micrograms (mcg) of folic acid every day.  If you want to prevent pregnancy, talk to your health care provider about birth control (contraception). Osteoporosis and menopause  Osteoporosis is a disease in which the bones lose minerals and strength with aging. This  can result in serious bone fractures. Your risk for osteoporosis can be identified using a bone density scan.  If you are 55 years of age or older, or if you are at risk for osteoporosis and fractures, ask your health care provider if you should be screened.  Ask your health care provider whether you should take a calcium or vitamin D supplement to lower your risk for osteoporosis.  Menopause may have certain physical symptoms and risks.  Hormone replacement therapy may reduce some of these symptoms and risks. Talk to your health care provider about whether hormone replacement therapy is right for you. Follow these instructions at home:  Schedule regular health, dental, and eye exams.  Stay current with your immunizations.  Do not use any tobacco products including cigarettes, chewing tobacco, or electronic cigarettes.  If you are pregnant, do not drink alcohol.  If you are breastfeeding, limit how much and how often you drink alcohol.  Limit alcohol intake to no more than 1 drink per day for nonpregnant women. One drink equals 12 ounces of beer, 5 ounces of wine, or 1 ounces of hard liquor.  Do not use street drugs.  Do not share needles.  Ask your health care provider for help if you need support or information about quitting drugs.  Tell your health care provider if you often feel depressed.  Tell your health care provider if you have ever been abused or do not feel safe at home. This information is not intended to replace advice given to you by your health care provider. Make sure you discuss any questions you have with your health care provider. Document Released: 02/08/2011 Document Revised: 01/01/2016 Document Reviewed: 04/29/2015 Elsevier Interactive Patient Education  [employer].

## 2018-01-24 ENCOUNTER — Ambulatory Visit: Payer: Self-pay | Admitting: *Deleted

## 2018-01-24 NOTE — Telephone Encounter (Signed)
Patient is complaining of numbness in hands and arms since April. Fatigue- poor appetite. Patient states she has had these symptoms for some time and they are not improving.  Reason for Disposition . Weakness is a chronic symptom (recurrent or ongoing AND present > 4 weeks)  Answer Assessment - Initial Assessment Questions 1. SYMPTOM: "What is the main symptom you are concerned about?" (e.g., weakness, numbness)     Weakness and numbness in hands and arms, fatigue 2. ONSET: "When did this start?" (minutes, hours, days; while sleeping)     End of April- all the time 3. LAST NORMAL: "When was the last time you were normal (no symptoms)?"     Patient was normal before April- she does have arthritis in her hand 4. PATTERN "Does this come and go, or has it been constant since it started?"  "Is it present now?"     Constant- present all the time 5. CARDIAC SYMPTOMS: "Have you had any of the following symptoms: chest pain, difficulty breathing, palpitations?"     no 6. NEUROLOGIC SYMPTOMS: "Have you had any of the following symptoms: headache, dizziness, vision loss, double vision, changes in speech, unsteady on your feet?"     no 7. OTHER SYMPTOMS: "Do you have any other symptoms?"     Lately- not interested in food 8. PREGNANCY: "Is there any chance you are pregnant?" "When was your last menstrual period?"     n/a  Protocols used: WEAKNESS (GENERALIZED) AND FATIGUE-A-AH, NEUROLOGIC DEFICIT-A-AH

## 2018-01-30 ENCOUNTER — Encounter: Payer: Self-pay | Admitting: Internal Medicine

## 2018-01-30 ENCOUNTER — Ambulatory Visit (INDEPENDENT_AMBULATORY_CARE_PROVIDER_SITE_OTHER): Payer: Medicare Other | Admitting: Internal Medicine

## 2018-01-30 VITALS — BP 134/86 | HR 92 | Temp 98.9°F | Ht 67.0 in | Wt 163.0 lb

## 2018-01-30 DIAGNOSIS — R739 Hyperglycemia, unspecified: Secondary | ICD-10-CM | POA: Diagnosis not present

## 2018-01-30 DIAGNOSIS — G5603 Carpal tunnel syndrome, bilateral upper limbs: Secondary | ICD-10-CM | POA: Diagnosis not present

## 2018-01-30 DIAGNOSIS — M7711 Lateral epicondylitis, right elbow: Secondary | ICD-10-CM | POA: Diagnosis not present

## 2018-01-30 DIAGNOSIS — M7712 Lateral epicondylitis, left elbow: Secondary | ICD-10-CM

## 2018-01-30 DIAGNOSIS — I10 Essential (primary) hypertension: Secondary | ICD-10-CM

## 2018-01-30 DIAGNOSIS — M5412 Radiculopathy, cervical region: Secondary | ICD-10-CM | POA: Insufficient documentation

## 2018-01-30 MED ORDER — MELOXICAM 15 MG PO TABS: 15.0000 mg | ORAL_TABLET | Freq: Every day | ORAL | 5 refills | Status: DC | PRN

## 2018-01-30 NOTE — Patient Instructions (Signed)
Ok to stop the ibuprofen  Please take all new medication as prescribed - the mobic  OK to try the OTC wrist splints at night only   Please also see Dr Smith/sports medicine in this office if you are not improved in 1-2 wks  Please continue all other medications as before, and refills have been done if requested.  Please have the pharmacy call with any other refills you may need.  Please keep your appointments with your specialists as you may have planned

## 2018-01-30 NOTE — Progress Notes (Signed)
Subjective:    Patient ID: Julia Sanchez, female    DOB: 09-21-1945, 72 y.o.   MRN: [id number]  HPI  Here to f/u; overall doing ok,  Pt denies chest pain, increasing sob or doe, wheezing, orthopnea, PND, increased LE swelling, palpitations, dizziness or syncope.  Pt denies new neurological symptoms such as new headache, or facial or extremity weakness or numbness.  Pt denies polydipsia, polyuria, or low sugar episode.  Pt states overall good compliance with meds, mostly trying to follow appropriate diet, with wt overall stable,  but little exercise however. Is very active and in pickle with overwork caring for a 27 mo old  - her great grandaughter while mother works.  Also has some decreased appetite, and Does c/o ongoing fatigue, but denies signficant daytime hypersomnolence.  Denies worsening depressive symptoms, suicidal ideation, or panic.  Does have bilat paresthesias to both hands mostly the thumbs and first 2 fingers, also has tender pain to the bilat lateral epidondylar areas without swelling, but worse to keep picking up the baby, mild to mod x 1 mo, constant.  Past Medical History:  Diagnosis Date  . Anxiety    due to stress - no stress  . DIVERTICULOSIS, COLON 04/07/2009  . HYPERLIPIDEMIA 05/03/2007  . HYPERTENSION 05/03/2007  . SVD (spontaneous vaginal delivery)    x 2   Past Surgical History:  Procedure Laterality Date  . COLONOSCOPY    . CYSTOSCOPY    . EYE SURGERY  05/2012   macular hole - left  . EYE SURGERY  2014   cataract removed left eye  . HYSTEROSCOPY W/D&C  03/06/2013   Procedure: DILATATION AND CURETTAGE /HYSTEROSCOPY;  Surgeon: Margarette Asal, MD;  Location: [facility];  Service: Gynecology;;  . LAPAROSCOPIC ASSISTED VAGINAL HYSTERECTOMY N/A 06/27/2014   Procedure: LAPAROSCOPIC ASSISTED VAGINAL HYSTERECTOMY WITH BILATERAL SALPINGO OOPHORECTOMY;  Surgeon: Margarette Asal, MD;  Location: [facility];  Service: Gynecology;  Laterality: N/A;  . SALPINGOOPHORECTOMY  Bilateral 06/27/2014   Procedure: SALPINGO OOPHORECTOMY;  Surgeon: Margarette Asal, MD;  Location: [facility];  Service: Gynecology;  Laterality: Bilateral;  . TONSILLECTOMY    . WISDOM TOOTH EXTRACTION      reports that she has never smoked. She has never used smokeless tobacco. She reports that she drinks alcohol. She reports that she does not use drugs. family history includes Breast cancer in her maternal aunt; Coronary artery disease in her father; Diabetes in her cousin and maternal uncle; Heart disease in her father. Allergies  Allergen Reactions  . Nitrofurantoin Nausea And Vomiting  . Sulfonamide Derivatives Nausea And Vomiting   Current Outpatient Medications on File Prior to Visit  Medication Sig Dispense Refill  . amitriptyline (ELAVIL) 50 MG tablet Take 1 tablet (50 mg total) by mouth at bedtime. 90 tablet 3  . amLODipine (NORVASC) 10 MG tablet Take 1 tablet (10 mg total) by mouth daily. 90 tablet 3  . aspirin EC 81 MG tablet Take 1 tablet (81 mg total) by mouth daily. 90 tablet 11  . atorvastatin (LIPITOR) 20 MG tablet Take 1 tablet (20 mg total) by mouth daily. 90 tablet 3  . diclofenac sodium (VOLTAREN) 1 % GEL Apply 4 g topically 4 (four) times daily as needed. 400 g 5  . ibuprofen (ADVIL,MOTRIN) 800 MG tablet Take 1 tablet (800 mg total) by mouth every 8 (eight) hours as needed for moderate pain (mild pain). 30 tablet 1  . potassium chloride (K-DUR) 10 MEQ tablet TAKE 1 TABLET (  10 MEQ TOTAL) BY MOUTH DAILY. 90 tablet 3  . telmisartan-hydrochlorothiazide (MICARDIS HCT) 80-25 MG tablet Take 1 tablet by mouth daily. 90 tablet 3  . vitamin B-12 (CYANOCOBALAMIN) 100 MCG tablet Take 100 mcg by mouth daily.     No current facility-administered medications on file prior to visit.    Review of Systems  Constitutional: Negative for other unusual diaphoresis or sweats HENT: Negative for ear discharge or swelling Eyes: Negative for other worsening visual disturbances Respiratory:  Negative for stridor or other swelling  Gastrointestinal: Negative for worsening distension or other blood Genitourinary: Negative for retention or other urinary change Musculoskeletal: Negative for other MSK pain or swelling Skin: Negative for color change or other new lesions Neurological: Negative for worsening tremors and other numbness  Psychiatric/Behavioral: Negative for worsening agitation or other fatigue All other system neg per pt    Objective:   Physical Exam BP 134/86   Pulse 92   Temp 98.9 F (37.2 C) (Oral)   Ht 5\' 7"  (1.702 m)   Wt 163 lb (73.9 kg)   SpO2 97%   BMI 25.53 kg/m  VS noted,  Constitutional: Pt appears in NAD HENT: Head: NCAT.  Right Ear: External ear normal.  Left Ear: External ear normal.  Eyes: . Pupils are equal, round, and reactive to light. Conjunctivae and EOM are normal Nose: without d/c or deformity Neck: Neck supple. Gross normal ROM Cardiovascular: Normal rate and regular rhythm.   Pulmonary/Chest: Effort normal and breath sounds without rales or wheezing.  Has tender bilat lateral epicondylar areas Neurological: Pt is alert. At baseline orientation, motor grossly intact Skin: Skin is warm. No rashes, other new lesions, no LE edema Psychiatric: Pt behavior is normal without agitation  No other exam findings Lab Results  Component Value Date   WBC 7.1 11/29/2017   HGB 13.0 11/29/2017   HCT 37.1 11/29/2017   PLT 318.0 11/29/2017   GLUCOSE 121 (H) 11/29/2017   CHOL 145 11/29/2017   TRIG 96.0 11/29/2017   HDL 56.70 11/29/2017   LDLDIRECT 128.0 02/17/2010   LDLCALC 69 11/29/2017   ALT 17 11/29/2017   AST 16 11/29/2017   NA 136 11/29/2017   K 3.6 11/29/2017   CL 98 11/29/2017   CREATININE 0.69 11/29/2017   BUN 13 11/29/2017   CO2 29 11/29/2017   TSH 1.38 11/29/2017   HGBA1C 6.2 11/29/2017       Assessment & Plan:

## 2018-02-01 NOTE — Assessment & Plan Note (Signed)
stable overall by history and exam, recent data reviewed with pt, and pt to continue medical treatment as before,  to f/u any worsening symptoms or concerns BP Readings from Last 3 Encounters:  01/30/18 134/86  12/15/17 128/68  11/29/17 124/78

## 2018-02-01 NOTE — Assessment & Plan Note (Signed)
Ok for mobic prn, consider forearm band use, consider sport med referral but declines for now

## 2018-02-01 NOTE — Assessment & Plan Note (Signed)
stable overall by history and exam, recent data reviewed with pt, and pt to continue medical treatment as before,  to f/u any worsening symptoms or concerns Lab Results  Component Value Date   HGBA1C 6.2 11/29/2017

## 2018-02-01 NOTE — Assessment & Plan Note (Signed)
Mild symptomatic, exam benign, for bilat wrist splints at night

## 2018-02-17 ENCOUNTER — Ambulatory Visit (INDEPENDENT_AMBULATORY_CARE_PROVIDER_SITE_OTHER): Payer: Medicare Other | Admitting: Family Medicine

## 2018-02-17 ENCOUNTER — Encounter: Payer: Self-pay | Admitting: Family Medicine

## 2018-02-17 DIAGNOSIS — G5603 Carpal tunnel syndrome, bilateral upper limbs: Secondary | ICD-10-CM | POA: Diagnosis not present

## 2018-02-17 NOTE — Progress Notes (Signed)
Julia Sanchez - 72 y.o. female MRN [id number]  Date of birth: 12-29-1945  SUBJECTIVE:  Including CC & ROS.  Chief Complaint  Patient presents with  . Wrist Pain    Julia Sanchez is a 72 y.o. female that is presenting with numbness and tingling in the first 3 palmar digits on the left and right hand.  The left is worse than the right.. Pain has been ongoing for two months. Pain is constant.  Admits to tingling and numbness. Localized in the palm of her hand and radiates to her thumb and fingers.Deneis any locking of her fingers. Admits to weakness and having difficulty grasping objects. She cares for her 14 old granddaughter daily. She has been taking Mobic with no improvement in her symptoms. She has been wearing a brace daily for the past two weeks.    Review of her lab work from 4/23 shows an A1c of 6.2.   Review of Systems  Constitutional: Negative for fever.  HENT: Negative for congestion.   Respiratory: Negative for cough.   Cardiovascular: Negative for chest pain.  Gastrointestinal: Negative for abdominal pain.  Musculoskeletal: Negative for gait problem.  Skin: Negative for color change.  Neurological: Positive for numbness.  Hematological: Negative for adenopathy.  Psychiatric/Behavioral: Negative for agitation.    HISTORY: Past Medical, Surgical, Social, and Family History Reviewed & Updated per EMR.   Pertinent Historical Findings include:  Past Medical History:  Diagnosis Date  . Anxiety    due to stress - no stress  . DIVERTICULOSIS, COLON 04/07/2009  . HYPERLIPIDEMIA 05/03/2007  . HYPERTENSION 05/03/2007  . SVD (spontaneous vaginal delivery)    x 2    Past Surgical History:  Procedure Laterality Date  . COLONOSCOPY    . CYSTOSCOPY    . EYE SURGERY  05/2012   macular hole - left  . EYE SURGERY  2014   cataract removed left eye  . HYSTEROSCOPY W/D&C  03/06/2013   Procedure: DILATATION AND CURETTAGE /HYSTEROSCOPY;  Surgeon: Margarette Asal,  MD;  Location: [facility];  Service: Gynecology;;  . LAPAROSCOPIC ASSISTED VAGINAL HYSTERECTOMY N/A 06/27/2014   Procedure: LAPAROSCOPIC ASSISTED VAGINAL HYSTERECTOMY WITH BILATERAL SALPINGO OOPHORECTOMY;  Surgeon: Margarette Asal, MD;  Location: [facility];  Service: Gynecology;  Laterality: N/A;  . SALPINGOOPHORECTOMY Bilateral 06/27/2014   Procedure: SALPINGO OOPHORECTOMY;  Surgeon: Margarette Asal, MD;  Location: [facility];  Service: Gynecology;  Laterality: Bilateral;  . TONSILLECTOMY    . WISDOM TOOTH EXTRACTION      Allergies  Allergen Reactions  . Nitrofurantoin Nausea And Vomiting  . Sulfonamide Derivatives Nausea And Vomiting    Family History  Problem Relation Age of Onset  . Coronary artery disease Father        Hx MI  . Heart disease Father        CAD/MI- fatal  . Breast cancer Maternal Aunt   . Diabetes Maternal Uncle   . Diabetes Cousin      Social History   Socioeconomic History  . Marital status: Married    Spouse name: Not on file  . Number of children: 2  . Years of education: 2  . Highest education level: Not on file  Occupational History  . 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 Financial resource strain: Not hard at all  . Food insecurity:    Worry: Never true    Inability: Never true  . Transportation needs:  Medical: No    Non-medical: No  Tobacco Use  . Smoking status: Never Smoker  . Smokeless tobacco: Never Used  Substance and Sexual Activity  . Alcohol use: Yes    Comment: occassional  . Drug use: No  . Sexual activity: Yes    Partners: Male    Birth control/protection: None, Post-menopausal  Lifestyle  . Physical activity:    Days per week: 3 days    Minutes per session: 40 min  . Stress: To some extent  Relationships  . Social connections:    Talks on phone: More than three times a week    Gets together: More than three times a week    Attends religious service: More than 4 times per year    Active  member of club or organization: Yes    Attends meetings of clubs or organizations: 1 to 4 times per year    Relationship status: Married  . Intimate partner violence:    Fear of current or ex partner: No    Emotionally abused: No    Physically abused: No    Forced sexual activity: No  Other Topics Concern  . 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 Marriage is in good health. Lots of family stress.      PHYSICAL EXAM:  VS: BP 134/84 (BP Location: Left Arm, Patient Position: Sitting, Cuff Size: Normal)   Pulse 84   Ht 5\' 7"  (1.702 m)   Wt 162 lb (73.5 kg)   SpO2 98%   BMI 25.37 kg/m  Physical Exam Gen: NAD, alert, cooperative with exam, well-appearing ENT: normal lips, normal nasal mucosa,  Eye: normal EOM, normal conjunctiva and lids CV:  no edema, +2 pedal pulses   Resp: no accessory muscle use, non-labored,  Skin: no rashes, no areas of induration  Neuro: normal tone, normal sensation to touch Psych:  normal insight, alert and oriented MSK:  Left and right wrist and hand: Osteoarthritic changes of the PIP and DIP of each hand. No signs of atrophy. Normal strength resistance with finger abduction and abduction, and wrist extension and flexion. Normal grip strength and pincer grasp. Normal thumb opposition and extension. Tinel's on the left is mildly positive. Neurovascular intact   Aspiration/Injection Procedure Note Julia Sanchez 1945/12/14  Procedure: Injection Indications: left hand pain and numbness  Procedure Details Consent: Risks of procedure as well as the alternatives and risks of each were explained to the (patient/caregiver).  Consent for procedure obtained. Time Out: Verified patient identification, verified procedure, site/side was marked, verified  correct patient position, special equipment/implants available, medications/allergies/relevent history reviewed, required imaging and test results available.  Performed.  The area was cleaned with iodine and alcohol swabs.    The left carpal tunnel was injected using 1 cc's of 40 mg Depomedrol and 2 cc's of 1% lidocaine with a 25 1 1/2" needle.  Ultrasound was used. Images were obtained in Transverse and Long views showing the injection.    A sterile dressing was applied.  Patient did tolerate procedure well.           ASSESSMENT & PLAN:   Carpal tunnel syndrome, bilateral Clinical exam consistent with carpal tunnel. No suggestion of de Quervain's on ultrasound. -Carpal tunnel injection on the left today. -Counseled on home exercise therapy. -  Counseled on brace use. -Advised to follow-up.  If no improvement consider nerve conduction study versus another injection of the left and first injection on the right.

## 2018-02-17 NOTE — Assessment & Plan Note (Signed)
Clinical exam consistent with carpal tunnel. No suggestion of de Quervain's on ultrasound. -Carpal tunnel injection on the left today. -Counseled on home exercise therapy. -Counseled on brace use. -Advised to follow-up.  If no improvement consider nerve conduction study versus another injection of the left and first injection on the right.

## 2018-02-17 NOTE — Patient Instructions (Signed)
Nice to meet you  Please continue the braces  Please try the exercises  Please follow up with me in 3-4 weeks if your symptoms haven't improved

## 2018-03-09 ENCOUNTER — Encounter: Payer: Self-pay | Admitting: Family Medicine

## 2018-03-09 ENCOUNTER — Ambulatory Visit (INDEPENDENT_AMBULATORY_CARE_PROVIDER_SITE_OTHER): Payer: Medicare Other | Admitting: Family Medicine

## 2018-03-09 ENCOUNTER — Encounter: Payer: Self-pay | Admitting: Neurology

## 2018-03-09 VITALS — BP 132/78 | HR 78 | Ht 67.0 in | Wt 162.0 lb

## 2018-03-09 DIAGNOSIS — G5603 Carpal tunnel syndrome, bilateral upper limbs: Secondary | ICD-10-CM | POA: Diagnosis not present

## 2018-03-09 MED ORDER — GABAPENTIN 100 MG PO CAPS: 100.0000 mg | ORAL_CAPSULE | Freq: Three times a day (TID) | ORAL | 3 refills | Status: DC

## 2018-03-09 NOTE — Progress Notes (Signed)
Julia Sanchez - 72 y.o. female MRN [id number]  Date of birth: June 19, 1946  SUBJECTIVE:  Including CC & ROS.  Chief Complaint  Patient presents with  . Follow-up    Julia Sanchez is a 72 y.o. female that is here for carpal tunnel follow up. She reports her symptoms are worse. She received a carpal tunnel injection on 02/17/18 with no improvement.  The pain on her left side is going proximally up to her forearm and shoulder.  She feels that the weakness is getting worse.  The right side is located just to the palmar aspect of her hand.  Has no prior history of similar problems.  Has tried the braces with no improvement.   Review of Systems  Constitutional: Negative for fever.  HENT: Negative for congestion.   Respiratory: Negative for cough.   Cardiovascular: Negative for chest pain.  Gastrointestinal: Negative for abdominal pain.  Neurological: Positive for weakness.  Hematological: Negative for adenopathy.  Psychiatric/Behavioral: Negative for agitation.    HISTORY: Past Medical, Surgical, Social, and Family History Reviewed & Updated per EMR.   Pertinent Historical Findings include:  Past Medical History:  Diagnosis Date  . Anxiety    due to stress - no stress  . DIVERTICULOSIS, COLON 04/07/2009  . HYPERLIPIDEMIA 05/03/2007  . HYPERTENSION 05/03/2007  . SVD (spontaneous vaginal delivery)    x 2    Past Surgical History:  Procedure Laterality Date  . COLONOSCOPY    . CYSTOSCOPY    . EYE SURGERY  05/2012   macular hole - left  . EYE SURGERY  2014   cataract removed left eye  . HYSTEROSCOPY W/D&C  03/06/2013   Procedure: DILATATION AND CURETTAGE /HYSTEROSCOPY;  Surgeon: Margarette Asal, MD;  Location: [facility];  Service: Gynecology;;  . LAPAROSCOPIC ASSISTED VAGINAL HYSTERECTOMY N/A 06/27/2014   Procedure: LAPAROSCOPIC ASSISTED VAGINAL HYSTERECTOMY WITH BILATERAL SALPINGO OOPHORECTOMY;  Surgeon: Margarette Asal, MD;  Location: [facility];  Service: Gynecology;  Laterality:  N/A;  . SALPINGOOPHORECTOMY Bilateral 06/27/2014   Procedure: SALPINGO OOPHORECTOMY;  Surgeon: Margarette Asal, MD;  Location: [facility];  Service: Gynecology;  Laterality: Bilateral;  . TONSILLECTOMY    . WISDOM TOOTH EXTRACTION      Allergies  Allergen Reactions  . Nitrofurantoin Nausea And Vomiting  . Sulfonamide Derivatives Nausea And Vomiting    Family History  Problem Relation Age of Onset  . Coronary artery disease Father        Hx MI  . Heart disease Father        CAD/MI- fatal  . Breast cancer Maternal Aunt   . Diabetes Maternal Uncle   . Diabetes Cousin      Social History   Socioeconomic History  . Marital status: Married    Spouse name: Not on file  . Number of children: 2  . Years of education: 11  . Highest education level: Not on file  Occupational History  . 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 Financial resource strain: Not hard at all  . Food insecurity:    Worry: Never true    Inability: Never true  . Transportation needs:    Medical: No    Non-medical: No  Tobacco Use  . Smoking status: Never Smoker  . Smokeless tobacco: Never Used  Substance and Sexual Activity  . Alcohol use: Yes    Comment: occassional  . Drug use: No  . Sexual activity: Yes  Partners: Male    Birth control/protection: None, Post-menopausal  Lifestyle  . Physical activity:    Days per week: 3 days    Minutes per session: 40 min  . Stress: To some extent  Relationships  . Social connections:    Talks on phone: More than three times a week    Gets together: More than three times a week    Attends religious service: More than 4 times per year    Active member of club or organization: Yes    Attends meetings of clubs or organizations: 1 to 4 times per year    Relationship status: Married  . Intimate partner violence:    Fear of current or ex partner: No    Emotionally abused: No    Physically abused: No    Forced sexual activity: No    Other Topics Concern  . 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 Marriage is in good health. Lots of family stress.      PHYSICAL EXAM:  VS: BP 132/78 (BP Location: Left Arm, Patient Position: Sitting, Cuff Size: Normal)   Pulse 78   Ht 5\' 7"  (1.702 m)   Wt 162 lb (73.5 kg)   SpO2 98%   BMI 25.37 kg/m  Physical Exam Gen: NAD, alert, cooperative with exam, well-appearing ENT: normal lips, normal nasal mucosa,  Eye: normal EOM, normal conjunctiva and lids CV:  no edema, +2 pedal pulses   Resp: no accessory muscle use, non-labored,  Skin: no rashes, no areas of induration  Neuro: normal tone, normal sensation to touch Psych:  normal insight, alert and oriented MSK:  Left and Right hand:  No signs of atrophy. Normal grip strength. Normal finger abduction and abduction strength resistance. Normal strength resistance with wrist flexion and extension. Normal elbow flexion extension the left. Normal shoulder range of motion on the left. Neurovascularly intact.     ASSESSMENT & PLAN:   Carpal tunnel syndrome, bilateral Pain on the left seems to be more evident of cervical radiculopathy with the pain extending proximally upper arm.  She also had no improvement with the steroid injection of the left carpal tunnel.  Right still seems to be predominantly carpal tunnel in nature.  Has tried conservative therapies at this point with no improvement. -Nerve conduction study bilaterally. -Depending on results of study we may need to pursue surgery versus MRI of the cervical spine

## 2018-03-09 NOTE — Patient Instructions (Signed)
Good to see you  Please try the gabapentin. Start with one pill at night. You can increase this to two times and three times daily as you tolerate  Once the nerve study is conducted then I will call with the results and decide what to do from there.

## 2018-03-09 NOTE — Assessment & Plan Note (Signed)
Pain on the left seems to be more evident of cervical radiculopathy with the pain extending proximally upper arm.  She also had no improvement with the steroid injection of the left carpal tunnel.  Right still seems to be predominantly carpal tunnel in nature.  Has tried conservative therapies at this point with no improvement. -Nerve conduction study bilaterally. -Depending on results of study we may need to pursue surgery versus MRI of the cervical spine

## 2018-03-10 ENCOUNTER — Other Ambulatory Visit: Payer: Self-pay | Admitting: *Deleted

## 2018-03-10 DIAGNOSIS — G5603 Carpal tunnel syndrome, bilateral upper limbs: Secondary | ICD-10-CM

## 2018-03-28 ENCOUNTER — Ambulatory Visit (INDEPENDENT_AMBULATORY_CARE_PROVIDER_SITE_OTHER): Payer: Medicare Other | Admitting: Neurology

## 2018-03-28 DIAGNOSIS — M5412 Radiculopathy, cervical region: Secondary | ICD-10-CM

## 2018-03-28 DIAGNOSIS — G5603 Carpal tunnel syndrome, bilateral upper limbs: Secondary | ICD-10-CM | POA: Diagnosis not present

## 2018-03-28 NOTE — Procedures (Signed)
[facility]  [place], [address]  [address], [place] [address] Tel: [phone] Fax:  [phone] Test Date:  03/28/2018  Patient: Julia Sanchez DOB: [birth year] Physician: Narda Amber, DO  Sex: Female Height: 5\' 7"  Ref Phys: Clearance Coots, MD  ID#: [id number] Temp: 36.0C Technician:    Patient Complaints: This is a 72 year old female referred for evaluation of bilateral hand paresthesias.  NCV & EMG Findings: Extensive electrodiagnostic testing of the right upper extremity and additional studies of the left shows:  1. Bilateral median, ulnar, and mixed palmar sensory responses are within normal limits. 2. Bilateral median and ulnar motor responses are within normal limits. 3. Sparse chronic motor axon loss changes are seen affecting the C8 myotomes bilaterally, without accompanied active denervation.  Impression: 1. Chronic C8 radiculopathy affecting bilateral upper extremity, very mild in degree electrically.  2. There is no evidence of carpal tunnel syndrome.   ___________________________ Narda Amber, DO    Nerve Conduction Studies Anti Sensory Summary Table   Site NR Peak (ms) Norm Peak (ms) P-T Amp (V) Norm P-T Amp  Left Median Anti Sensory (2nd Digit)  36C  Wrist    2.9 <3.8 35.0 >10  Right Median Anti Sensory (2nd Digit)  36C  Wrist    2.8 <3.8 35.7 >10  Left Ulnar Anti Sensory (5th Digit)  36C  Wrist    2.4 <3.2 46.9 >5  Right Ulnar Anti Sensory (5th Digit)  36C  Wrist    2.6 <3.2 43.6 >5   Motor Summary Table   Site NR Onset (ms) Norm Onset (ms) O-P Amp (mV) Norm O-P Amp Site1 Site2 Delta-0 (ms) Dist (cm) Vel (m/s) Norm Vel (m/s)  Left Median Motor (Abd Poll Brev)  36C  Wrist    2.4 <4.0 8.4 >5 Elbow Wrist 5.2 28.0 54 >50  Elbow    7.6  7.7         Right Median Motor (Abd Poll Brev)  36C  Wrist    2.5 <4.0 9.3 >5 Elbow Wrist 5.5 29.0 53 >50  Elbow    8.0  9.3         Left Ulnar Motor (Abd Dig Minimi)  36C  Wrist    2.2 <3.1  10.4 >7 B Elbow Wrist 3.9 25.0 64 >50  B Elbow    6.1  9.3  A Elbow B Elbow 1.7 10.0 59 >50  A Elbow    7.8  9.0         Right Ulnar Motor (Abd Dig Minimi)  36C  Wrist    2.3 <3.1 9.0 >7 B Elbow Wrist 4.1 23.0 56 >50  B Elbow    6.4  8.8  A Elbow B Elbow 1.8 10.0 56 >50  A Elbow    8.2  8.5          Comparison Summary Table   Site NR Peak (ms) Norm Peak (ms) P-T Amp (V) Site1 Site2 Delta-P (ms) Norm Delta (ms)  Left Median/Ulnar Palm Comparison (Wrist - 8cm)  36C  Median Palm    1.7 <2.2 40.5 Median Palm Ulnar Palm 0.0   Ulnar Palm    1.7 <2.2 40.3      Right Median/Ulnar Palm Comparison (Wrist - 8cm)  36C  Median Palm    1.8 <2.2 41.3 Median Palm Ulnar Palm 0.1   Ulnar Palm    1.7 <2.2 30.7       EMG   Side Muscle Ins Act Fibs Psw Fasc Number  Recrt Dur Dur. Amp Amp. Poly Poly. Comment  Right Deltoid Nml Nml Nml Nml Nml Nml Nml Nml Nml Nml Nml Nml N/A  Right Biceps Nml Nml Nml Nml Nml Nml Nml Nml Nml Nml Nml Nml N/A  Right Triceps Nml Nml Nml Nml Nml Nml Nml Nml Nml Nml Nml Nml N/A  Right PronatorTeres Nml Nml Nml Nml Nml Nml Nml Nml Nml Nml Nml Nml N/A  Right Ext Indicis Nml Nml Nml Nml 1- Rapid Few 1+ Few 1+ Nml Nml N/A  Right 1stDorInt Nml Nml Nml Nml 1- Rapid Few 1+ Few 1+ Nml Nml N/A  Left Biceps Nml Nml Nml Nml Nml Nml Nml Nml Nml Nml Nml Nml N/A  Left Triceps Nml Nml Nml Nml Nml Nml Nml Nml Nml Nml Nml Nml N/A  Left PronatorTeres Nml Nml Nml Nml Nml Nml Nml Nml Nml Nml Nml Nml N/A  Left Deltoid Nml Nml Nml Nml Nml Nml Nml Nml Nml Nml Nml Nml N/A  Left 1stDorInt Nml Nml Nml Nml 1- Rapid Few 1+ Few 1+ Nml Nml N/A  Left Ext Indicis Nml Nml Nml Nml 1- Rapid Few 1+ Few 1+ Nml Nml N/A      Waveforms:

## 2018-03-29 ENCOUNTER — Telehealth: Payer: Self-pay | Admitting: Family Medicine

## 2018-03-29 NOTE — Telephone Encounter (Signed)
Left VM for patient. If she calls back please have her speak with a nurse/CMA and inform that her nerve study doesn't show carpal tunnel. It does show a nerve compression in her neck . The [facility] can report results to patient.   If any questions then please take the best time and phone number to call and I will try to call her back.   Rosemarie Ax, MD [facility] 03/29/2018, 1:12 PM

## 2018-04-03 ENCOUNTER — Telehealth: Payer: Self-pay | Admitting: Internal Medicine

## 2018-04-03 ENCOUNTER — Telehealth: Payer: Self-pay | Admitting: Family Medicine

## 2018-04-03 DIAGNOSIS — M5412 Radiculopathy, cervical region: Secondary | ICD-10-CM

## 2018-04-03 NOTE — Telephone Encounter (Signed)
error 

## 2018-04-03 NOTE — Telephone Encounter (Signed)
Dr. Raeford Razor, Patient called the [facility] and does have some follow up questions for you regarding her neck. She can be reached at home this afternoon at [phone].  (She was transferred to [facility] in error).

## 2018-04-04 NOTE — Telephone Encounter (Signed)
Will refer to pt for cervical radiculopathy.   Rosemarie Ax, MD [facility] 04/04/2018, 11:34 AM

## 2018-04-21 ENCOUNTER — Ambulatory Visit: Payer: Medicare Other | Attending: Family Medicine | Admitting: Physical Therapy

## 2018-04-21 ENCOUNTER — Encounter: Payer: Self-pay | Admitting: Physical Therapy

## 2018-04-21 DIAGNOSIS — M79631 Pain in right forearm: Secondary | ICD-10-CM | POA: Diagnosis not present

## 2018-04-21 DIAGNOSIS — M79632 Pain in left forearm: Secondary | ICD-10-CM | POA: Diagnosis not present

## 2018-04-21 DIAGNOSIS — M62838 Other muscle spasm: Secondary | ICD-10-CM | POA: Insufficient documentation

## 2018-04-21 DIAGNOSIS — R293 Abnormal posture: Secondary | ICD-10-CM | POA: Diagnosis not present

## 2018-04-21 NOTE — Patient Instructions (Signed)

## 2018-04-21 NOTE — Therapy (Signed)
[facility] [address], [place], [place], [address] Phone: [phone]   Fax:  [phone]  Physical Therapy Evaluation  Patient Details  Name: Julia Sanchez MRN: [id number] Date of Birth: January 10, 1946 Referring Provider: Clearance Coots, MD    Encounter Date: 04/21/2018  PT End of Session - 04/21/18 0938    Visit Number  1    Date for PT Re-Evaluation  06/23/18    Authorization Type  Medicare A and B    Authorization Time Period  04/21/18 to 06/23/18    PT Start Time  0845    PT Stop Time  0935    PT Time Calculation (min)  50 min    Activity Tolerance  No increased pain;Patient tolerated treatment well    Behavior During Therapy  [facility] for tasks assessed/performed       Past Medical History:  Diagnosis Date  . Anxiety    due to stress - no stress  . DIVERTICULOSIS, COLON 04/07/2009  . HYPERLIPIDEMIA 05/03/2007  . HYPERTENSION 05/03/2007  . SVD (spontaneous vaginal delivery)    x 2    Past Surgical History:  Procedure Laterality Date  . COLONOSCOPY    . CYSTOSCOPY    . EYE SURGERY  05/2012   macular hole - left  . EYE SURGERY  2014   cataract removed left eye  . HYSTEROSCOPY W/D&C  03/06/2013   Procedure: DILATATION AND CURETTAGE /HYSTEROSCOPY;  Surgeon: Margarette Asal, MD;  Location: [facility];  Service: Gynecology;;  . LAPAROSCOPIC ASSISTED VAGINAL HYSTERECTOMY N/A 06/27/2014   Procedure: LAPAROSCOPIC ASSISTED VAGINAL HYSTERECTOMY WITH BILATERAL SALPINGO OOPHORECTOMY;  Surgeon: Margarette Asal, MD;  Location: [facility];  Service: Gynecology;  Laterality: N/A;  . SALPINGOOPHORECTOMY Bilateral 06/27/2014   Procedure: SALPINGO OOPHORECTOMY;  Surgeon: Margarette Asal, MD;  Location: [facility];  Service: Gynecology;  Laterality: Bilateral;  . TONSILLECTOMY    . WISDOM TOOTH EXTRACTION      There were no vitals filed for this visit.   Subjective Assessment - 04/21/18 0848    Subjective  Pt reports that she  has been trying to care for her 17 month old great granddaughter full time. She noticed that the end of April she started having numbness in the first two fingers in her Lt hand which gradually increased to the hand, forearm and up to the shoulder. She also noticed the same symptoms in the Rt hand shortly after. She feels that her grip is decreased as well. She has numbness in the hand only but the rest of the arm feels tired.      Pertinent History  carpal tunnel testing was negative on the Lt    Limitations  Writing    Patient Stated Goals  improve UE strength     Currently in Pain?  Yes    Pain Score  7     Pain Location  --   Lt and Rt forearm    Pain Orientation  Right;Left    Pain Descriptors / Indicators  Aching    Pain Type  Acute pain    Pain Radiating Towards  travels up the outside of the elbow and up to the shoulder     Pain Onset  More than a month ago    Aggravating Factors   cold makes it ache, alot of activity     Pain Relieving Factors  warm bath helps alot, resting     Effect of Pain on Daily Activities  difficulty with caring for her great grand-daughter         [facility] PT Assessment - 04/21/18 0001      Assessment   Medical Diagnosis  cervical radiculopathy     Referring Provider  Clearance Coots, MD     Onset Date/Surgical Date  --   end of April   Prior Therapy  none       Precautions   Precautions  None      Balance Screen   Has the patient fallen in the past 6 months  No    Has the patient had a decrease in activity level because of a fear of falling?   No    Is the patient reluctant to leave their home because of a fear of falling?   No      Prior Function   Vocation Requirements  currently cares for her great grand daughter 2 days a week     Leisure  enjoys reading      Observation/Other Assessments   Focus on Therapeutic Outcomes (FOTO)   38% limited       Sensation   Additional Comments  numbness/tingling reported Lt and Rt first 4 digits       Posture/Postural Control   Posture Comments  forward head, increased thoracic kyphosis      ROM / Strength   AROM / PROM / Strength  AROM;Strength      AROM   AROM Assessment Site  Cervical    Cervical - Right Rotation  50    Cervical - Left Rotation  50      Strength   Strength Assessment Site  Shoulder;Elbow;Wrist    Right/Left Shoulder  Right;Left    Right Shoulder Flexion  3/5    Right Shoulder ABduction  3/5    Right Shoulder Internal Rotation  4/5   discomfort across forearm   Right Shoulder External Rotation  3/5    Left Shoulder Flexion  3/5    Left Shoulder ABduction  3/5    Left Shoulder Internal Rotation  4/5   discomfort acorss forearm   Left Shoulder External Rotation  3/5    Right/Left Elbow  Right;Left    Right Elbow Flexion  3/5    Right Elbow Extension  3/5    Left Elbow Flexion  3/5    Left Elbow Extension  3/5    Right/Left Wrist  Right;Left   Rt pronation/supination 4/5 MMT, Lt pronation/supination 3/5   Right Wrist Flexion  5/5    Right Wrist Extension  5/5    Right Wrist Radial Deviation  5/5    Right Wrist Ulnar Deviation  5/5    Left Wrist Flexion  5/5    Left Wrist Extension  5/5    Left Wrist Radial Deviation  5/5    Left Wrist Ulnar Deviation  5/5      Palpation   Palpation comment  palpable tenderness along wrist extensors, adductor policis, bicep, upper trap      Special Tests   Other special tests  finkelstein's inconclusive                Objective measurements completed on examination: See above findings.      [facility] Adult PT Treatment/Exercise - 04/21/18 0001      Exercises   Exercises  Wrist      Wrist Exercises   Other wrist exercises  Wrist extensor stretch x10 sec Lt and Rt       Manual  Therapy   Manual Therapy  Joint mobilization    Joint Mobilization  CPAs T4 to C4 x1 bout, grade III             PT Education - 04/21/18 0950    Education Details  HEP implemented; dry needling info; eval findings/POC     Person(s) Educated  Patient    Methods  Explanation;Verbal cues;Handout    Comprehension  Verbalized understanding;Returned demonstration       PT Short Term Goals - 04/21/18 0939      PT SHORT TERM GOAL #1   Title  Pt will demo consistency and independence with her initial HEP to improve posture and decrease pain with daily activity.     Time  4    Period  Weeks    Status  New    Target Date  05/21/18      PT SHORT TERM GOAL #2   Title  Pt will be able to verbalize proper posture adjustments to be made while reading to take strain off of her neck.     Time  4    Period  Weeks    Status  New      PT SHORT TERM GOAL #3   Title  Pt will report atleast 30% decrease in her Lt and Rt forearm pain from the start of PT.     Time  4    Period  Weeks    Status  New        PT Long Term Goals - 04/21/18 0941      PT LONG TERM GOAL #1   Title  Pt will have atleast 4/5 MMT strength of the shoulders and forearms to improve her efficiency and mechanics with daily activity.     Time  8    Period  Weeks    Status  New    Target Date  06/23/18      PT LONG TERM GOAL #2   Title  Pt will report being able to lift her grand daughter with no more than 3/10 discomfort during the day.     Time  8    Period  Weeks    Status  New      PT LONG TERM GOAL #3   Title  Pt will report atleast 70% reduction in forearm pain with activity.    Time  8    Period  Weeks    Status  New      PT LONG TERM GOAL #4   Title  Pt will have atleast 5 lb improvement in her grip strength from the start of PT, to improve her ability to peel potatoes at home.     Time  8    Period  Weeks    Status  New             Plan - 04/21/18 1001    Clinical Impression Statement  Pt is a pleasant 72 y.o F referred to OPPT with complaints of worsening Lt greater than Rt forearm discomfort. Her pain was onset approximately 5 months ago after she had been caring for her great granddaughter full time.  She does  demonstrate forward head posture with rounded shoulders, however her UE symptoms were relatively unchanged with repeated flexion/extension movement of the neck and with segmental testing of the spine.  Pt has significant, gross weakness of B shoulders, and wrist/forearm musculature with her pain reproduced during testing. She has palpable tenderness and trigger points in  the forearm, bicep, and hand which also reportedly reproduced her symptoms.  Although she does have limitations in cervical ROM and has palpable tenderness in the area, her symptoms appear to be related to repetitive overuse and compensations from UE strength imbalances.  She would greatly benefit from skilled PT to address her limitations in cervical ROM, improve posture/mechanics, decrease pain/spams and improve BUE strength to facilitate her return to regular activity at home without limitation.     History and Personal Factors relevant to plan of care:  carpal tunnel testing negative    Clinical Presentation  Evolving    Clinical Presentation due to:  worsening over time     Clinical Decision Making  Low    Rehab Potential  Good    PT Frequency  2x / week    PT Duration  8 weeks    PT Treatment/Interventions  ADLs/Self Care Home Management;Moist Heat;Patient/family education;Manual techniques;Passive range of motion;Therapeutic exercise;Electrical Stimulation;Iontophoresis 4mg /ml Dexamethasone;Cryotherapy;Therapeutic activities;Neuromuscular re-education;Spinal Manipulations;Dry needling    PT Next Visit Plan  dry needling forearm extensors, thumb muscle group, bicep; implement HEP scap strength, shoulder strength; genltle forearm stretches    PT Home Exercise Plan  wrist extension stretch, self massage    Consulted and Agree with Plan of Care  Patient       Patient will benefit from skilled therapeutic intervention in order to improve the following deficits and impairments:  Decreased strength, Impaired flexibility, Impaired UE  functional use, Postural dysfunction, Pain, Improper body mechanics, Decreased range of motion, Decreased mobility, Decreased endurance, Increased muscle spasms, Hypomobility  Visit Diagnosis: Pain in left forearm  Pain in right forearm  Other muscle spasm  Abnormal posture     Problem List Patient Active Problem List   Diagnosis Date Noted  . Cervical radiculopathy 01/30/2018  . Lateral epicondylitis of both elbows 01/30/2018  . Arthritis of hand, degenerative 11/29/2017  . Heart murmur 11/24/2016  . Peripheral neuropathy 11/24/2016  . Thrombocytosis ([place]) 11/24/2016  . Viral illness 11/25/2015  . Hyperglycemia 11/25/2015  . Dysplasia of cervix 06/27/2014  . Routine health maintenance 04/26/2011  . Diverticulitis of colon 04/07/2009  . Hyperlipidemia 05/03/2007  . Essential hypertension 05/03/2007    10:15 AM,04/21/18 Sherol Dade PT, DPT [place] [facility] [place]  [facility] [address] [place], [place], [address] Phone: [phone]   Fax:  [phone]  Name: ANDERSEN MCKIVER MRN: [id number] Date of Birth: [birth year]

## 2018-04-26 ENCOUNTER — Encounter: Payer: Self-pay | Admitting: Physical Therapy

## 2018-04-26 ENCOUNTER — Ambulatory Visit: Payer: Medicare Other | Admitting: Physical Therapy

## 2018-04-26 DIAGNOSIS — M79632 Pain in left forearm: Secondary | ICD-10-CM

## 2018-04-26 DIAGNOSIS — R293 Abnormal posture: Secondary | ICD-10-CM

## 2018-04-26 DIAGNOSIS — M79631 Pain in right forearm: Secondary | ICD-10-CM | POA: Diagnosis not present

## 2018-04-26 DIAGNOSIS — M62838 Other muscle spasm: Secondary | ICD-10-CM | POA: Diagnosis not present

## 2018-04-26 NOTE — Therapy (Signed)
[facility] [address] [place], [place], [address] Phone: [phone]   Fax:  [phone]  Physical Therapy Treatment  Patient Details  Name: Julia Sanchez MRN: [id number] Date of Birth: 01-07-1946 Referring Provider: Clearance Coots, MD    Encounter Date: 04/26/2018  PT End of Session - 04/26/18 1635    Visit Number  2    Date for PT Re-Evaluation  06/23/18    Authorization Type  Medicare A and B    Authorization Time Period  04/21/18 to 06/23/18    PT Start Time  1359    PT Stop Time  1447    PT Time Calculation (min)  48 min    Activity Tolerance  No increased pain;Patient tolerated treatment well    Behavior During Therapy  [facility] for tasks assessed/performed       Past Medical History:  Diagnosis Date  . Anxiety    due to stress - no stress  . DIVERTICULOSIS, COLON 04/07/2009  . HYPERLIPIDEMIA 05/03/2007  . HYPERTENSION 05/03/2007  . SVD (spontaneous vaginal delivery)    x 2    Past Surgical History:  Procedure Laterality Date  . COLONOSCOPY    . CYSTOSCOPY    . EYE SURGERY  05/2012   macular hole - left  . EYE SURGERY  2014   cataract removed left eye  . HYSTEROSCOPY W/D&C  03/06/2013   Procedure: DILATATION AND CURETTAGE /HYSTEROSCOPY;  Surgeon: Margarette Asal, MD;  Location: [facility];  Service: Gynecology;;  . LAPAROSCOPIC ASSISTED VAGINAL HYSTERECTOMY N/A 06/27/2014   Procedure: LAPAROSCOPIC ASSISTED VAGINAL HYSTERECTOMY WITH BILATERAL SALPINGO OOPHORECTOMY;  Surgeon: Margarette Asal, MD;  Location: [facility];  Service: Gynecology;  Laterality: N/A;  . SALPINGOOPHORECTOMY Bilateral 06/27/2014   Procedure: SALPINGO OOPHORECTOMY;  Surgeon: Margarette Asal, MD;  Location: [facility];  Service: Gynecology;  Laterality: Bilateral;  . TONSILLECTOMY    . WISDOM TOOTH EXTRACTION      There were no vitals filed for this visit.  Subjective Assessment - 04/26/18 1612    Subjective  Pt reports ongoing  constant bilateral forearm and hand heaviness and numbness into hands and first 4 digits Lt>Rt.  She denies having any neck pain but does know she is stiff through there.  She has backed down her care of her great granddaughter (20 month old, 27lbs) to 2 days/week instead of 5.    Pertinent History  carpal tunnel testing was negative on the Lt    Limitations  Writing;Lifting;Other (comment)   carrying   Patient Stated Goals  improve UE strength     Currently in Pain?  Yes    Pain Score  5     Pain Location  Arm    Pain Orientation  Left    Pain Descriptors / Indicators  Heaviness;Aching;Numbness    Pain Type  Acute pain    Pain Onset  More than a month ago                       [facility] Adult PT Treatment/Exercise - 04/26/18 0001      Exercises   Exercises  Neck;Hand      Neck Exercises: Supine   Cervical Isometrics  Right lateral flexion;Left lateral flexion;Right rotation;Left rotation;10 secs;10 reps   PT cued Pt to perform neck retraction along with isometrics   Neck Retraction  10 reps;10 secs      Manual Therapy   Manual Therapy  Joint mobilization;Soft  tissue mobilization;Manual Traction    Joint Mobilization  OA nodding, upglides bil facets C3-C7 Gr II/III, downglides C7-T3 bil Gr II/III    Soft tissue mobilization  cervical paraspinals, upper traps, scalenes bil. Lt wrist extensors    Manual Traction  mid and lower cervical spine Gr II/III       Trigger Point Dry Needling - 04/26/18 1617    Consent Given?  Yes    Education Handout Provided  Yes    Muscles Treated Upper Body  --   Lt wrist extensors, twitch response noted          PT Education - 04/26/18 1615    Education Details  dry needling after-care, cervical stabilization program    Person(s) Educated  Patient    Methods  Explanation;Verbal cues;Handout    Comprehension  Verbalized understanding;Returned demonstration       PT Short Term Goals - 04/21/18 0939      PT SHORT TERM GOAL #1    Title  Pt will demo consistency and independence with her initial HEP to improve posture and decrease pain with daily activity.     Time  4    Period  Weeks    Status  New    Target Date  05/21/18      PT SHORT TERM GOAL #2   Title  Pt will be able to verbalize proper posture adjustments to be made while reading to take strain off of her neck.     Time  4    Period  Weeks    Status  New      PT SHORT TERM GOAL #3   Title  Pt will report atleast 30% decrease in her Lt and Rt forearm pain from the start of PT.     Time  4    Period  Weeks    Status  New        PT Long Term Goals - 04/21/18 0941      PT LONG TERM GOAL #1   Title  Pt will have atleast 4/5 MMT strength of the shoulders and forearms to improve her efficiency and mechanics with daily activity.     Time  8    Period  Weeks    Status  New    Target Date  06/23/18      PT LONG TERM GOAL #2   Title  Pt will report being able to lift her grand daughter with no more than 3/10 discomfort during the day.     Time  8    Period  Weeks    Status  New      PT LONG TERM GOAL #3   Title  Pt will report atleast 70% reduction in forearm pain with activity.    Time  8    Period  Weeks    Status  New      PT LONG TERM GOAL #4   Title  Pt will have atleast 5 lb improvement in her grip strength from the start of PT, to improve her ability to peel potatoes at home.     Time  8    Period  Weeks    Status  New            Plan - 04/26/18 1636    Clinical Impression Statement  Pt reported improved mobility and less tension through neck and Lt shoulder after manual therapy.  She did not note any improvement in bil forearm heaviness and numbness  in hands with cervical traction or cervical manual therapy.  She tolerated dry needling well and performed proper neck retraction exercises with PT cueing for proper recruitment of deep neck flexors.  Pt will benefit from continued postural re-ed, cervical and scapular stabilization,  dry needling, manual therapy and functional activity training for proper bending, lifting, carrying related to caring for her great granddaughter with safety and successful symptom management.    Rehab Potential  Good    PT Frequency  2x / week    PT Duration  8 weeks    PT Treatment/Interventions  ADLs/Self Care Home Management;Moist Heat;Patient/family education;Manual techniques;Passive range of motion;Therapeutic exercise;Electrical Stimulation;Iontophoresis 4mg /ml Dexamethasone;Cryotherapy;Therapeutic activities;Neuromuscular re-education;Spinal Manipulations;Dry needling    PT Next Visit Plan  Access Code: [email or web address]: f/u on dry needling forearm extensors, HEP cervical stabilization, add scap strength, bending and lifting techniques    PT Home Exercise Plan  wrist extension stretch, self massage    Consulted and Agree with Plan of Care  Patient       Patient will benefit from skilled therapeutic intervention in order to improve the following deficits and impairments:  Decreased strength, Impaired flexibility, Impaired UE functional use, Postural dysfunction, Pain, Improper body mechanics, Decreased range of motion, Decreased mobility, Decreased endurance, Increased muscle spasms, Hypomobility  Visit Diagnosis: Pain in left forearm  Pain in right forearm  Other muscle spasm  Abnormal posture     Problem List Patient Active Problem List   Diagnosis Date Noted  . Cervical radiculopathy 01/30/2018  . Lateral epicondylitis of both elbows 01/30/2018  . Arthritis of hand, degenerative 11/29/2017  . Heart murmur 11/24/2016  . Peripheral neuropathy 11/24/2016  . Thrombocytosis ([place]) 11/24/2016  . Viral illness 11/25/2015  . Hyperglycemia 11/25/2015  . Dysplasia of cervix 06/27/2014  . Routine health maintenance 04/26/2011  . Diverticulitis of colon 04/07/2009  . Hyperlipidemia 05/03/2007  . Essential hypertension 05/03/2007   Baruch Merl, PT 04/26/18 4:49 PM     [facility] [address] [place], [place], [address] Phone: [phone]   Fax:  [phone]  Name: Julia Sanchez MRN: [id number] Date of Birth: 25-Apr-1946

## 2018-04-26 NOTE — Patient Instructions (Signed)
Access Code: [email or web address]  URL: [email or web address]  Date: 04/26/2018  Prepared by: Venetia Night Beuhring   Exercises  Supine Cervical Retraction with Towel - 10 reps - 3 sets - 10 hold - 1x daily - 7x weekly  Seated Cervical Retraction - 10 reps - 3 sets - 10 hold - 1x daily - 7x weekly  Seated Isometric Cervical Sidebending - 10 reps - 3 sets - 10 hold - 1x daily - 7x weekly  Standing Isometric Cervical Rotation - 10 reps - 3 sets - 10 hold - 1x daily - 7x weekly  Putty Squeezes - 3 sets - 60 hold - 1x daily - 7x weekly

## 2018-04-28 ENCOUNTER — Ambulatory Visit: Payer: Medicare Other | Admitting: Physical Therapy

## 2018-04-28 ENCOUNTER — Encounter: Payer: Self-pay | Admitting: Physical Therapy

## 2018-04-28 ENCOUNTER — Encounter

## 2018-04-28 DIAGNOSIS — R293 Abnormal posture: Secondary | ICD-10-CM | POA: Diagnosis not present

## 2018-04-28 DIAGNOSIS — M79632 Pain in left forearm: Secondary | ICD-10-CM

## 2018-04-28 DIAGNOSIS — M62838 Other muscle spasm: Secondary | ICD-10-CM | POA: Diagnosis not present

## 2018-04-28 DIAGNOSIS — M79631 Pain in right forearm: Secondary | ICD-10-CM | POA: Diagnosis not present

## 2018-04-28 NOTE — Patient Instructions (Signed)
Access Code: [email or web address]  URL: [email or web address]  Date: 04/28/2018  Prepared by: Venetia Night Lorah Kalina   Exercises  Supine Cervical Retraction with Towel - 10 reps - 3 sets - 10 hold - 1x daily - 7x weekly  Seated Cervical Retraction - 10 reps - 3 sets - 10 hold - 1x daily - 7x weekly  Seated Isometric Cervical Sidebending - 10 reps - 3 sets - 10 hold - 1x daily - 7x weekly  Standing Isometric Cervical Rotation - 10 reps - 3 sets - 10 hold - 1x daily - 7x weekly  Putty Squeezes - 3 sets - 60 hold - 1x daily - 7x weekly  Sidelying Thoracic Rotation with Open Book - 10 reps - 3 sets - 1x daily - 7x weekly  Plank with Thoracic Rotation on Counter - 10 reps - 3 sets - 1x daily - 7x weekly  Standing Hip Hinge with Dowel - 10 reps - 3 sets - 1x daily - 7x weekly  Seated Scapular Retraction - 10 reps - 3 sets - 1x daily - 7x weekly  Standing Backward Shoulder Rolls - 10 reps - 3 sets - 1x daily - 7x weekly  Patient Education  Holding and Carrying a [employer]

## 2018-04-28 NOTE — Therapy (Signed)
[facility] [address] [place], [place], [address] Phone: [phone]   Fax:  [phone]  Physical Therapy Treatment  Patient Details  Name: Julia Sanchez MRN: [id number] Date of Birth: 29-Oct-1945 Referring Provider: Clearance Coots, MD    Encounter Date: 04/28/2018  PT End of Session - 04/28/18 1301    Visit Number  3    Date for PT Re-Evaluation  06/23/18    Authorization Type  Medicare A and B    Authorization Time Period  04/21/18 to 06/23/18    PT Start Time  0758    PT Stop Time  0843    PT Time Calculation (min)  45 min    Activity Tolerance  No increased pain;Patient tolerated treatment well    Behavior During Therapy  [facility] for tasks assessed/performed       Past Medical History:  Diagnosis Date  . Anxiety    due to stress - no stress  . DIVERTICULOSIS, COLON 04/07/2009  . HYPERLIPIDEMIA 05/03/2007  . HYPERTENSION 05/03/2007  . SVD (spontaneous vaginal delivery)    x 2    Past Surgical History:  Procedure Laterality Date  . COLONOSCOPY    . CYSTOSCOPY    . EYE SURGERY  05/2012   macular hole - left  . EYE SURGERY  2014   cataract removed left eye  . HYSTEROSCOPY W/D&C  03/06/2013   Procedure: DILATATION AND CURETTAGE /HYSTEROSCOPY;  Surgeon: Margarette Asal, MD;  Location: [facility];  Service: Gynecology;;  . LAPAROSCOPIC ASSISTED VAGINAL HYSTERECTOMY N/A 06/27/2014   Procedure: LAPAROSCOPIC ASSISTED VAGINAL HYSTERECTOMY WITH BILATERAL SALPINGO OOPHORECTOMY;  Surgeon: Margarette Asal, MD;  Location: [facility];  Service: Gynecology;  Laterality: N/A;  . SALPINGOOPHORECTOMY Bilateral 06/27/2014   Procedure: SALPINGO OOPHORECTOMY;  Surgeon: Margarette Asal, MD;  Location: [facility];  Service: Gynecology;  Laterality: Bilateral;  . TONSILLECTOMY    . WISDOM TOOTH EXTRACTION      There were no vitals filed for this visit.  Subjective Assessment - 04/28/18 0848    Subjective  Pt states she felt the  dry needling really helped on Lt posterior forearm so now she notices anterior forearm pain more.  Exercises are going well.     Pertinent History  carpal tunnel testing was negative on the Lt    Limitations  Writing;Lifting;Other (comment)    Patient Stated Goals  improve UE strength     Currently in Pain?  Yes    Pain Score  7     Pain Location  Arm    Pain Orientation  Left    Pain Descriptors / Indicators  Aching;Heaviness    Pain Type  Acute pain    Pain Onset  More than a month ago    Pain Frequency  Constant                       [facility] Adult PT Treatment/Exercise - 04/28/18 0001      Exercises   Exercises  Neck;Shoulder;Lumbar      Neck Exercises: Standing   Other Standing Exercises  Plank at countertop thoracic rotation bil x 10 reps    Other Standing Exercises  Functional squat with dowel   PT cued for bending at hips and to maintain neck retraction.     Neck Exercises: Seated   Shoulder Rolls  Backwards;20 reps      Neck Exercises: Supine   Neck Retraction  10 reps;10 secs  Neck Exercises: Sidelying   Other Sidelying Exercise  Open Books, bil, 15 reps      Shoulder Exercises: Seated   Row  Strengthening;20 reps;Both   PT cued to keep neck long to avoid shoulder hike     Shoulder Exercises: ROM/Strengthening   Other ROM/Strengthening Exercises  Foam Roller Ys, Ws, Ts   PT cued arm position verbally and stand by assist for balanc     Manual Therapy   Manual Therapy  Soft tissue mobilization    Soft tissue mobilization  Lt forearm flexors and supinator s/p dry needling       Trigger Point Dry Needling - 04/28/18 1256    Consent Given?  Yes    Education Handout Provided  Yes    Muscles Treated Upper Body  --   Lt forearm flexors, Lt supinator, palpable twitch noted          PT Education - 04/28/18 1259    Education Details  bending/lifting/carrying safety and mechanics - bend with hips, maintain neck retraction, avoid curling wrist  under toddler and carry toddler in front of body vs. on one side. Wrap towel around vacuum handle to avoid tight grip.    Person(s) Educated  Patient    Methods  Explanation;Demonstration;Handout;Verbal cues    Comprehension  Verbalized understanding       PT Short Term Goals - 04/21/18 0939      PT SHORT TERM GOAL #1   Title  Pt will demo consistency and independence with her initial HEP to improve posture and decrease pain with daily activity.     Time  4    Period  Weeks    Status  New    Target Date  05/21/18      PT SHORT TERM GOAL #2   Title  Pt will be able to verbalize proper posture adjustments to be made while reading to take strain off of her neck.     Time  4    Period  Weeks    Status  New      PT SHORT TERM GOAL #3   Title  Pt will report atleast 30% decrease in her Lt and Rt forearm pain from the start of PT.     Time  4    Period  Weeks    Status  New        PT Long Term Goals - 04/21/18 0941      PT LONG TERM GOAL #1   Title  Pt will have atleast 4/5 MMT strength of the shoulders and forearms to improve her efficiency and mechanics with daily activity.     Time  8    Period  Weeks    Status  New    Target Date  06/23/18      PT LONG TERM GOAL #2   Title  Pt will report being able to lift her grand daughter with no more than 3/10 discomfort during the day.     Time  8    Period  Weeks    Status  New      PT LONG TERM GOAL #3   Title  Pt will report atleast 70% reduction in forearm pain with activity.    Time  8    Period  Weeks    Status  New      PT LONG TERM GOAL #4   Title  Pt will have atleast 5 lb improvement in her grip strength from the start  of PT, to improve her ability to peel potatoes at home.     Time  8    Period  Weeks    Status  New            Plan - 04/28/18 1302    Clinical Impression Statement  Pt reported improved Lt arm pain with DN today from a 7/10 to 5/10.  PT noted more normalized tone in forearm extensor and  flexor muscles from last two PT sessions with dry needling.  PT encouraged better awareness of cervical posture during functional movement patterns to practice safer movement patterns and reduce exacerbation of symptoms especially in the care of her great granddaughter.      Rehab Potential  Good    PT Frequency  2x / week    PT Duration  8 weeks    PT Treatment/Interventions  ADLs/Self Care Home Management;Moist Heat;Patient/family education;Manual techniques;Passive range of motion;Therapeutic exercise;Electrical Stimulation;Iontophoresis 4mg /ml Dexamethasone;Cryotherapy;Therapeutic activities;Neuromuscular re-education;Spinal Manipulations;Dry needling    PT Next Visit Plan  Access Code: [email or web address]: f/u on dry needling forearm extensors, HEP cervical stabilization, thoracic ROM, progress cervico-scap strength    PT Home Exercise Plan  Access Code: [email or web address]    Consulted and Agree with Plan of Care  Patient       Patient will benefit from skilled therapeutic intervention in order to improve the following deficits and impairments:  Decreased strength, Impaired flexibility, Impaired UE functional use, Postural dysfunction, Pain, Improper body mechanics, Decreased range of motion, Decreased mobility, Decreased endurance, Increased muscle spasms, Hypomobility  Visit Diagnosis: Pain in left forearm  Other muscle spasm  Abnormal posture     Problem List Patient Active Problem List   Diagnosis Date Noted  . Cervical radiculopathy 01/30/2018  . Lateral epicondylitis of both elbows 01/30/2018  . Arthritis of hand, degenerative 11/29/2017  . Heart murmur 11/24/2016  . Peripheral neuropathy 11/24/2016  . Thrombocytosis ([place]) 11/24/2016  . Viral illness 11/25/2015  . Hyperglycemia 11/25/2015  . Dysplasia of cervix 06/27/2014  . Routine health maintenance 04/26/2011  . Diverticulitis of colon 04/07/2009  . Hyperlipidemia 05/03/2007  . Essential hypertension 05/03/2007    Baruch Merl,  PT 04/28/18 1:16 PM    [facility] [address] [place], [place], [address] Phone: [phone]   Fax:  [phone]  Name: Julia Sanchez MRN: [id number] Date of Birth: 1946/02/26

## 2018-05-01 ENCOUNTER — Encounter: Payer: Self-pay | Admitting: Physical Therapy

## 2018-05-01 ENCOUNTER — Ambulatory Visit: Payer: Medicare Other | Admitting: Physical Therapy

## 2018-05-01 DIAGNOSIS — M79631 Pain in right forearm: Secondary | ICD-10-CM | POA: Diagnosis not present

## 2018-05-01 DIAGNOSIS — R293 Abnormal posture: Secondary | ICD-10-CM | POA: Diagnosis not present

## 2018-05-01 DIAGNOSIS — M62838 Other muscle spasm: Secondary | ICD-10-CM | POA: Diagnosis not present

## 2018-05-01 DIAGNOSIS — M79632 Pain in left forearm: Secondary | ICD-10-CM

## 2018-05-01 NOTE — Patient Instructions (Signed)
Access Code: [email or web address]  URL: [email or web address]  Date: 05/01/2018  Prepared by: Venetia Night Kaeleen Odom   Exercises  Supine Cervical Retraction with Towel - 10 reps - 3 sets - 10 hold - 1x daily - 7x weekly  Seated Cervical Retraction - 10 reps - 3 sets - 10 hold - 1x daily - 7x weekly  Seated Isometric Cervical Sidebending - 10 reps - 3 sets - 10 hold - 1x daily - 7x weekly  Standing Isometric Cervical Rotation - 10 reps - 3 sets - 10 hold - 1x daily - 7x weekly  Putty Squeezes - 3 sets - 60 hold - 1x daily - 7x weekly  Sidelying Thoracic Rotation with Open Book - 10 reps - 3 sets - 1x daily - 7x weekly  Plank with Thoracic Rotation on Counter - 10 reps - 3 sets - 1x daily - 7x weekly  Standing Hip Hinge with Dowel - 10 reps - 3 sets - 1x daily - 7x weekly  Seated Scapular Retraction - 10 reps - 3 sets - 1x daily - 7x weekly  Standing Backward Shoulder Rolls - 10 reps - 3 sets - 1x daily - 7x weekly  Supine Shoulder Horizontal Abduction with Resistance - 10 reps - 3 sets - 1x daily - 7x weekly  Seated Shoulder Diagonal Pulls with Resistance - 10 reps - 3 sets - 1x daily - 7x weekly  Patient Education  Holding and Carrying a [employer]

## 2018-05-01 NOTE — Therapy (Signed)
[facility] [address], [place], [place], [address] Phone: [phone]   Fax:  [phone]  Physical Therapy Treatment  Patient Details  Name: Julia Sanchez MRN: [id number] Date of Birth: [birth year] Referring Provider: Clearance Coots, MD    Encounter Date: 05/01/2018  PT End of Session - 05/01/18 1142    Visit Number  4    Date for PT Re-Evaluation  06/23/18    Authorization Type  Medicare A and B    Authorization Time Period  04/21/18 to 06/23/18    PT Start Time  1100    PT Stop Time  1140    PT Time Calculation (min)  40 min    Activity Tolerance  No increased pain;Patient tolerated treatment well    Behavior During Therapy  [facility] for tasks assessed/performed       Past Medical History:  Diagnosis Date  . Anxiety    due to stress - no stress  . DIVERTICULOSIS, COLON 04/07/2009  . HYPERLIPIDEMIA 05/03/2007  . HYPERTENSION 05/03/2007  . SVD (spontaneous vaginal delivery)    x 2    Past Surgical History:  Procedure Laterality Date  . COLONOSCOPY    . CYSTOSCOPY    . EYE SURGERY  05/2012   macular hole - left  . EYE SURGERY  2014   cataract removed left eye  . HYSTEROSCOPY W/D&C  03/06/2013   Procedure: DILATATION AND CURETTAGE /HYSTEROSCOPY;  Surgeon: Margarette Asal, MD;  Location: [facility];  Service: Gynecology;;  . LAPAROSCOPIC ASSISTED VAGINAL HYSTERECTOMY N/A 06/27/2014   Procedure: LAPAROSCOPIC ASSISTED VAGINAL HYSTERECTOMY WITH BILATERAL SALPINGO OOPHORECTOMY;  Surgeon: Margarette Asal, MD;  Location: [facility];  Service: Gynecology;  Laterality: N/A;  . SALPINGOOPHORECTOMY Bilateral 06/27/2014   Procedure: SALPINGO OOPHORECTOMY;  Surgeon: Margarette Asal, MD;  Location: [facility];  Service: Gynecology;  Laterality: Bilateral;  . TONSILLECTOMY    . WISDOM TOOTH EXTRACTION      There were no vitals filed for this visit.  Subjective Assessment - 05/01/18 1100    Subjective  Pt states that she  felt her left forearm and hand was less heavy and less numb after last visit.  However when she did a lot of housework on Saturday she felt it went back to baseline.  Forearm ache continued into the next day but hand numbness recovered back to improved state.    Pertinent History  carpal tunnel testing was negative on the Lt    Limitations  Writing;Lifting;Other (comment)    Patient Stated Goals  improve UE strength     Currently in Pain?  Yes    Pain Score  6     Pain Location  Arm    Pain Orientation  Left   dorsal proximal forearm   Pain Descriptors / Indicators  Aching;Heaviness    Pain Type  Acute pain    Pain Onset  More than a month ago    Pain Frequency  Intermittent                       [facility] Adult PT Treatment/Exercise - 05/01/18 0001      Exercises   Exercises  Neck;Elbow;Hand;Shoulder      Neck Exercises: Machines for Strengthening   UBE (Upper Arm Bike)  level 1 x 4', 2 fwd/2 bwd   PT used towel rolls on handles for wider grip     Neck Exercises: Theraband   Rows  20 reps;Red  Rows Limitations  2 sets      Neck Exercises: Standing   Neck Retraction  15 reps;5 secs    Neck Retraction Limitations  ball on the wall    Other Standing Exercises  Plank at countertop thoracic rotation bil x 10 reps      Shoulder Exercises: Supine   Protraction  Strengthening;15 reps;Weights   4lb dumbbells   Horizontal ABduction  Strengthening;15 reps;Theraband    Theraband Level (Shoulder Horizontal ABduction)  Level 2 (Red)    Diagonals  Strengthening;Both;15 reps    Theraband Level (Shoulder Diagonals)  Level 2 (Red)   used handles on tband     Hand Exercises for Cervical Radiculopathy   Gross Grasp  3lb digi-flex 20 reps 2 sets bil             PT Education - 05/01/18 1141    Education Details  Access Code: [email or web address], added scapular theraband exercises    Person(s) Educated  Patient    Methods  Explanation;Demonstration;Handout    Comprehension   Verbalized understanding;Returned demonstration       PT Short Term Goals - 05/01/18 1145      PT SHORT TERM GOAL #1   Title  Pt will demo consistency and independence with her initial HEP to improve posture and decrease pain with daily activity.     Time  4    Period  Weeks    Status  On-going      PT SHORT TERM GOAL #2   Title  Pt will be able to verbalize proper posture adjustments to be made while reading to take strain off of her neck.     Time  4    Period  Weeks    Status  On-going        PT Long Term Goals - 04/21/18 0941      PT LONG TERM GOAL #1   Title  Pt will have atleast 4/5 MMT strength of the shoulders and forearms to improve her efficiency and mechanics with daily activity.     Time  8    Period  Weeks    Status  New    Target Date  06/23/18      PT LONG TERM GOAL #2   Title  Pt will report being able to lift her grand daughter with no more than 3/10 discomfort during the day.     Time  8    Period  Weeks    Status  New      PT LONG TERM GOAL #3   Title  Pt will report atleast 70% reduction in forearm pain with activity.    Time  8    Period  Weeks    Status  New      PT LONG TERM GOAL #4   Title  Pt will have atleast 5 lb improvement in her grip strength from the start of PT, to improve her ability to peel potatoes at home.     Time  8    Period  Weeks    Status  New            Plan - 05/01/18 1142    Clinical Impression Statement  Pt feels that PT is helping her Lt forearm and hand pain and numbness.  She still reported onset of Lt UE symptoms with significant household cleaning over the weekend but was able to return to improved state by the next day.  PT monitored her tolerance of ther  ex closely today and was pleased to find that she did not report any increase in symptoms with exercises today.  Rather, she reported a "different kind of tiredness" in the forearm and felt improved ache at the end of session.  She will continue to benefit  from skilled advancement of ther ex for postural re-education and development of HEP with dry needling with manual therapy as needed.    Rehab Potential  Good    PT Frequency  2x / week    PT Duration  8 weeks    PT Treatment/Interventions  ADLs/Self Care Home Management;Moist Heat;Patient/family education;Manual techniques;Passive range of motion;Therapeutic exercise;Electrical Stimulation;Iontophoresis 4mg /ml Dexamethasone;Cryotherapy;Therapeutic activities;Neuromuscular re-education;Spinal Manipulations;Dry needling    PT Next Visit Plan  Access Code: [email or web address], continue scapular/cervical stabilization, grip strength, postural re-ed exercises    PT Home Exercise Plan  Access Code: [email or web address]    Consulted and Agree with Plan of Care  Patient       Patient will benefit from skilled therapeutic intervention in order to improve the following deficits and impairments:  Decreased strength, Impaired flexibility, Impaired UE functional use, Postural dysfunction, Pain, Improper body mechanics, Decreased range of motion, Decreased mobility, Decreased endurance, Increased muscle spasms, Hypomobility  Visit Diagnosis: Pain in left forearm  Abnormal posture     Problem List Patient Active Problem List   Diagnosis Date Noted  . Cervical radiculopathy 01/30/2018  . Lateral epicondylitis of both elbows 01/30/2018  . Arthritis of hand, degenerative 11/29/2017  . Heart murmur 11/24/2016  . Peripheral neuropathy 11/24/2016  . Thrombocytosis ([place]) 11/24/2016  . Viral illness 11/25/2015  . Hyperglycemia 11/25/2015  . Dysplasia of cervix 06/27/2014  . Routine health maintenance 04/26/2011  . Diverticulitis of colon 04/07/2009  . Hyperlipidemia 05/03/2007  . Essential hypertension 05/03/2007   Baruch Merl, PT 05/01/18 11:47 AM   [facility] [address], [place], [place], [address] Phone: [phone]   Fax:   [phone]  Name: Julia Sanchez MRN: [id number] Date of Birth: 1946-03-23

## 2018-05-03 ENCOUNTER — Encounter: Payer: Self-pay | Admitting: Physical Therapy

## 2018-05-03 ENCOUNTER — Ambulatory Visit: Payer: Medicare Other | Admitting: Physical Therapy

## 2018-05-03 DIAGNOSIS — M79632 Pain in left forearm: Secondary | ICD-10-CM | POA: Diagnosis not present

## 2018-05-03 DIAGNOSIS — R293 Abnormal posture: Secondary | ICD-10-CM | POA: Diagnosis not present

## 2018-05-03 DIAGNOSIS — M79631 Pain in right forearm: Secondary | ICD-10-CM | POA: Diagnosis not present

## 2018-05-03 DIAGNOSIS — M62838 Other muscle spasm: Secondary | ICD-10-CM

## 2018-05-03 NOTE — Patient Instructions (Signed)
Encouraged Pt to focus on posture and body mechanics on days she cares for great granddaughter and to do the exercises on the other days as not to overdo herself and flare up her symptoms.

## 2018-05-03 NOTE — Therapy (Signed)
[facility] [address] [place], [place], [address] Phone: [phone]   Fax:  [phone]  Physical Therapy Treatment  Patient Details  Name: Julia Sanchez MRN: [id number] Date of Birth: 07/09/1946 Referring Provider: Clearance Coots, MD    Encounter Date: 05/03/2018  PT End of Session - 05/03/18 1355    Visit Number  5    Date for PT Re-Evaluation  06/23/18    Authorization Type  Medicare A and B    Authorization Time Period  04/21/18 to 06/23/18    PT Start Time  6222    PT Stop Time  1442    PT Time Calculation (min)  44 min    Activity Tolerance  No increased pain;Patient tolerated treatment well    Behavior During Therapy  [facility] for tasks assessed/performed       Past Medical History:  Diagnosis Date  . Anxiety    due to stress - no stress  . DIVERTICULOSIS, COLON 04/07/2009  . HYPERLIPIDEMIA 05/03/2007  . HYPERTENSION 05/03/2007  . SVD (spontaneous vaginal delivery)    x 2    Past Surgical History:  Procedure Laterality Date  . COLONOSCOPY    . CYSTOSCOPY    . EYE SURGERY  05/2012   macular hole - left  . EYE SURGERY  2014   cataract removed left eye  . HYSTEROSCOPY W/D&C  03/06/2013   Procedure: DILATATION AND CURETTAGE /HYSTEROSCOPY;  Surgeon: Margarette Asal, MD;  Location: [facility];  Service: Gynecology;;  . LAPAROSCOPIC ASSISTED VAGINAL HYSTERECTOMY N/A 06/27/2014   Procedure: LAPAROSCOPIC ASSISTED VAGINAL HYSTERECTOMY WITH BILATERAL SALPINGO OOPHORECTOMY;  Surgeon: Margarette Asal, MD;  Location: [facility];  Service: Gynecology;  Laterality: N/A;  . SALPINGOOPHORECTOMY Bilateral 06/27/2014   Procedure: SALPINGO OOPHORECTOMY;  Surgeon: Margarette Asal, MD;  Location: [facility];  Service: Gynecology;  Laterality: Bilateral;  . TONSILLECTOMY    . WISDOM TOOTH EXTRACTION      There were no vitals filed for this visit.  Subjective Assessment - 05/03/18 1356    Subjective  Pt reports some  increased left forearm soreness and attributes it to taking care of her great granddaughter yesterday and today AND doing the exercises yesterday.  Currently feels like the forearm has overdone it and is too sore to have dry needling today and maybe can't push the exercises either.    Pertinent History  carpal tunnel testing was negative on the Lt    Limitations  Writing;Lifting;Other (comment)    Patient Stated Goals  improve UE strength     Currently in Pain?  Yes    Pain Score  7     Pain Orientation  Left    Pain Descriptors / Indicators  Aching;Heaviness    Pain Onset  More than a month ago                       [facility] Adult PT Treatment/Exercise - 05/03/18 0001      Exercises   Exercises  Neck;Shoulder;Elbow      Neck Exercises: Machines for Strengthening   UBE (Upper Arm Bike)  level 1 x 6', 3 fwd/3 bwd   PT used towel rolls on handles for wider grip     Shoulder Exercises: ROM/Strengthening   Other ROM/Strengthening Exercises  Open Books sidelying x 15 reps bil      Shoulder Exercises: Stretch   Other Shoulder Stretches  Doorway pectoral stretch bil    1x30  sec   Other Shoulder Stretches  Snow angel arms against wall x 10 reps      Manual Therapy   Manual Therapy  Soft tissue mobilization    Soft tissue mobilization  Lt forearm flexors, extensors and thenar eminence   Tender wrist extensors>flexors on Lt            PT Education - 05/03/18 1850    Education Details  See Pt instructions    Person(s) Educated  Patient    Methods  Explanation    Comprehension  Verbalized understanding       PT Short Term Goals - 05/01/18 1145      PT SHORT TERM GOAL #1   Title  Pt will demo consistency and independence with her initial HEP to improve posture and decrease pain with daily activity.     Time  4    Period  Weeks    Status  On-going      PT SHORT TERM GOAL #2   Title  Pt will be able to verbalize proper posture adjustments to be made while  reading to take strain off of her neck.     Time  4    Period  Weeks    Status  On-going        PT Long Term Goals - 04/21/18 0941      PT LONG TERM GOAL #1   Title  Pt will have atleast 4/5 MMT strength of the shoulders and forearms to improve her efficiency and mechanics with daily activity.     Time  8    Period  Weeks    Status  New    Target Date  06/23/18      PT LONG TERM GOAL #2   Title  Pt will report being able to lift her grand daughter with no more than 3/10 discomfort during the day.     Time  8    Period  Weeks    Status  New      PT LONG TERM GOAL #3   Title  Pt will report atleast 70% reduction in forearm pain with activity.    Time  8    Period  Weeks    Status  New      PT LONG TERM GOAL #4   Title  Pt will have atleast 5 lb improvement in her grip strength from the start of PT, to improve her ability to peel potatoes at home.     Time  8    Period  Weeks    Status  New            Plan - 05/03/18 1442    Clinical Impression Statement  Pt reported increased ache and heaviness in Lt forearm today which she attributes to her two consecutive days of caring for her great granddaughter.  PT advised that she focus on proper posture and body mechanics on days with toddler and focus on PT exercises on other days as not to overdo and get into flare up.  Pt's pain and soft tissue mobility in Lt UE improved with manual therapy today.  Her postural awareness is improving during functional tasks.  She will continue to benefit from skilled PT to address her pain and deficits.      Rehab Potential  Good    PT Frequency  2x / week    PT Duration  8 weeks    PT Treatment/Interventions  ADLs/Self Care Home Management;Moist Heat;Patient/family education;Manual  techniques;Passive range of motion;Therapeutic exercise;Electrical Stimulation;Iontophoresis 4mg /ml Dexamethasone;Cryotherapy;Therapeutic activities;Neuromuscular re-education;Spinal Manipulations;Dry needling     PT Next Visit Plan  Access Code: [email or web address], continue scapular/cervical stabilization, grip strength, postural re-ed exercises    PT Home Exercise Plan  Access Code: [email or web address]    Consulted and Agree with Plan of Care  Patient       Patient will benefit from skilled therapeutic intervention in order to improve the following deficits and impairments:  Decreased strength, Impaired flexibility, Impaired UE functional use, Postural dysfunction, Pain, Improper body mechanics, Decreased range of motion, Decreased mobility, Decreased endurance, Increased muscle spasms, Hypomobility  Visit Diagnosis: Pain in left forearm  Abnormal posture  Pain in right forearm  Other muscle spasm     Problem List Patient Active Problem List   Diagnosis Date Noted  . Cervical radiculopathy 01/30/2018  . Lateral epicondylitis of both elbows 01/30/2018  . Arthritis of hand, degenerative 11/29/2017  . Heart murmur 11/24/2016  . Peripheral neuropathy 11/24/2016  . Thrombocytosis ([place]) 11/24/2016  . Viral illness 11/25/2015  . Hyperglycemia 11/25/2015  . Dysplasia of cervix 06/27/2014  . Routine health maintenance 04/26/2011  . Diverticulitis of colon 04/07/2009  . Hyperlipidemia 05/03/2007  . Essential hypertension 05/03/2007    Baruch Merl, PT 05/03/18 7:20 PM     [facility] [address], [place], [place], [address] Phone: [phone]   Fax:  [phone]  Name: Julia Sanchez MRN: [id number] Date of Birth: 1946/05/30

## 2018-05-08 ENCOUNTER — Ambulatory Visit: Payer: Medicare Other | Admitting: Physical Therapy

## 2018-05-08 ENCOUNTER — Encounter: Payer: Self-pay | Admitting: Physical Therapy

## 2018-05-08 DIAGNOSIS — M62838 Other muscle spasm: Secondary | ICD-10-CM

## 2018-05-08 DIAGNOSIS — M79631 Pain in right forearm: Secondary | ICD-10-CM | POA: Diagnosis not present

## 2018-05-08 DIAGNOSIS — R293 Abnormal posture: Secondary | ICD-10-CM | POA: Diagnosis not present

## 2018-05-08 DIAGNOSIS — M79632 Pain in left forearm: Secondary | ICD-10-CM

## 2018-05-08 NOTE — Therapy (Signed)
[facility] [address] [place], [place], [address] Phone: [phone]   Fax:  [phone]  Physical Therapy Treatment  Patient Details  Name: Julia Sanchez MRN: [id number] Date of Birth: Jul 04, 1946 Referring Provider (PT): Clearance Coots, MD    Encounter Date: 05/08/2018  PT End of Session - 05/08/18 1333    Visit Number  6    Date for PT Re-Evaluation  06/23/18    Authorization Type  Medicare A and B    Authorization Time Period  04/21/18 to 06/23/18    PT Start Time  0843    PT Stop Time  0928    PT Time Calculation (min)  45 min    Activity Tolerance  No increased pain;Patient tolerated treatment well    Behavior During Therapy  [facility] for tasks assessed/performed       Past Medical History:  Diagnosis Date  . Anxiety    due to stress - no stress  . DIVERTICULOSIS, COLON 04/07/2009  . HYPERLIPIDEMIA 05/03/2007  . HYPERTENSION 05/03/2007  . SVD (spontaneous vaginal delivery)    x 2    Past Surgical History:  Procedure Laterality Date  . COLONOSCOPY    . CYSTOSCOPY    . EYE SURGERY  05/2012   macular hole - left  . EYE SURGERY  2014   cataract removed left eye  . HYSTEROSCOPY W/D&C  03/06/2013   Procedure: DILATATION AND CURETTAGE /HYSTEROSCOPY;  Surgeon: Margarette Asal, MD;  Location: [facility];  Service: Gynecology;;  . LAPAROSCOPIC ASSISTED VAGINAL HYSTERECTOMY N/A 06/27/2014   Procedure: LAPAROSCOPIC ASSISTED VAGINAL HYSTERECTOMY WITH BILATERAL SALPINGO OOPHORECTOMY;  Surgeon: Margarette Asal, MD;  Location: [facility];  Service: Gynecology;  Laterality: N/A;  . SALPINGOOPHORECTOMY Bilateral 06/27/2014   Procedure: SALPINGO OOPHORECTOMY;  Surgeon: Margarette Asal, MD;  Location: [facility];  Service: Gynecology;  Laterality: Bilateral;  . TONSILLECTOMY    . WISDOM TOOTH EXTRACTION      There were no vitals filed for this visit.  Subjective Assessment - 05/08/18 0845    Subjective  Pt reports she  has increased Lt > Rt arm fatigue with housework and the exercises over the weekend.  Once Lt arm got tired it stayed with her all day.  Heat on Lt arm helped.    Pertinent History  carpal tunnel testing was negative on the Lt    Limitations  Writing;Lifting;Other (comment)    Patient Stated Goals  improve UE strength     Currently in Pain?  No/denies   Had 8/10 pain yesterday                      [facility] Adult PT Treatment/Exercise - 05/08/18 0001      Exercises   Exercises  Shoulder      Neck Exercises: Machines for Strengthening   UBE (Upper Arm Bike)  level 1 x 6' alt fwd/bwd each min      Neck Exercises: Standing   Neck Retraction  10 reps    Neck Retraction Limitations  10 sec, ball on wall      Neck Exercises: Supine   Cervical Isometrics  Right rotation;Left rotation;Right lateral flexion;Left lateral flexion;Extension;10 secs;5 reps   performed after manual therapy   Neck Retraction  5 reps;10 secs      Shoulder Exercises: Supine   Other Supine Exercises  shoulder flexion bil with dowel for upper thoracic extension      Shoulder Exercises: Standing  External Rotation  Strengthening    Theraband Level (Shoulder External Rotation)  Level 2 (Red)    External Rotation Limitations  10 reps    PT cued to maintain neck retraction     Manual Therapy   Manual Therapy  Soft tissue mobilization;Joint mobilization    Joint Mobilization  C6-C8 upglides bil Gr II/III, C8-T3 downglides bil Gr II/III    Soft tissue mobilization  Lt posterior shoulder, upper trap, cervical and upper thoracic paraspinals, rhomboids   in Rt sidelying and supine   Manual Traction  segmental traction Gr II/III C6/7, C7/T1 supine               PT Short Term Goals - 05/08/18 0848      PT SHORT TERM GOAL #2   Title  Pt will be able to verbalize proper posture adjustments to be made while reading to take strain off of her neck.     Time  4    Period  Weeks    Status  Achieved       PT SHORT TERM GOAL #3   Title  Pt will report atleast 30% decrease in her Lt and Rt forearm pain from the start of PT.    Pt reports some days she has 30% improvement but not all days of the week.   Time  4    Period  Weeks    Status  On-going        PT Long Term Goals - 04/21/18 0941      PT LONG TERM GOAL #1   Title  Pt will have atleast 4/5 MMT strength of the shoulders and forearms to improve her efficiency and mechanics with daily activity.     Time  8    Period  Weeks    Status  New    Target Date  06/23/18      PT LONG TERM GOAL #2   Title  Pt will report being able to lift her grand daughter with no more than 3/10 discomfort during the day.     Time  8    Period  Weeks    Status  New      PT LONG TERM GOAL #3   Title  Pt will report atleast 70% reduction in forearm pain with activity.    Time  8    Period  Weeks    Status  New      PT LONG TERM GOAL #4   Title  Pt will have atleast 5 lb improvement in her grip strength from the start of PT, to improve her ability to peel potatoes at home.     Time  8    Period  Weeks    Status  New            Plan - 05/08/18 0930    Clinical Impression Statement  Pt reported ongoing Lt>Rt fatigue, heaviness and pain in bil UEs during household and daily tasks over the weekend.  Pain got up to 8/10.  She continued to display limited mobility in lower cervical and upper thoracic spine.  PT focused on increasing joint mobility and soft tissue extensibility throughout posterior cervico-thoracic region today.  Pt able to perform more segmental ROM and more even cervical lordosis end of session.  Pt reported improve bil arm symptoms reporting less ache and heaviness and more warmth end of session.  Pt will continue to benefit from skilled PT to address cervical and thoracic mobility, strength  and cascade of UE symptoms related to chronic C8 radiculopathy.       Patient will benefit from skilled therapeutic intervention in  order to improve the following deficits and impairments:     Visit Diagnosis: Abnormal posture  Pain in left forearm  Pain in right forearm  Other muscle spasm     Problem List Patient Active Problem List   Diagnosis Date Noted  . Cervical radiculopathy 01/30/2018  . Lateral epicondylitis of both elbows 01/30/2018  . Arthritis of hand, degenerative 11/29/2017  . Heart murmur 11/24/2016  . Peripheral neuropathy 11/24/2016  . Thrombocytosis ([place]) 11/24/2016  . Viral illness 11/25/2015  . Hyperglycemia 11/25/2015  . Dysplasia of cervix 06/27/2014  . Routine health maintenance 04/26/2011  . Diverticulitis of colon 04/07/2009  . Hyperlipidemia 05/03/2007  . Essential hypertension 05/03/2007    Baruch Merl, PT 05/08/18 1:36 PM   [facility] [address], [place], [place], [address] Phone: [phone]   Fax:  [phone]  Name: Julia Sanchez MRN: [id number] Date of Birth: [birth year]

## 2018-05-11 ENCOUNTER — Ambulatory Visit: Payer: Medicare Other | Attending: Family Medicine | Admitting: Physical Therapy

## 2018-05-11 ENCOUNTER — Encounter: Payer: Self-pay | Admitting: Physical Therapy

## 2018-05-11 DIAGNOSIS — M79632 Pain in left forearm: Secondary | ICD-10-CM | POA: Diagnosis not present

## 2018-05-11 DIAGNOSIS — M79631 Pain in right forearm: Secondary | ICD-10-CM | POA: Insufficient documentation

## 2018-05-11 DIAGNOSIS — M62838 Other muscle spasm: Secondary | ICD-10-CM | POA: Diagnosis not present

## 2018-05-11 DIAGNOSIS — R293 Abnormal posture: Secondary | ICD-10-CM | POA: Insufficient documentation

## 2018-05-11 NOTE — Therapy (Signed)
[facility] [address] [place], [place], [address] Phone: [phone]   Fax:  [phone]  Physical Therapy Treatment  Patient Details  Name: Julia Sanchez MRN: [id number] Date of Birth: [birth year] Referring Provider (PT): Clearance Coots, MD    Encounter Date: 05/11/2018  PT End of Session - 05/11/18 0948    Visit Number  7    Date for PT Re-Evaluation  06/23/18    Authorization Type  Medicare A and B    Authorization Time Period  04/21/18 to 06/23/18    PT Start Time  0845    PT Stop Time  0937    PT Time Calculation (min)  52 min    Activity Tolerance  No increased pain;Patient tolerated treatment well    Behavior During Therapy  [facility] for tasks assessed/performed       Past Medical History:  Diagnosis Date  . Anxiety    due to stress - no stress  . DIVERTICULOSIS, COLON 04/07/2009  . HYPERLIPIDEMIA 05/03/2007  . HYPERTENSION 05/03/2007  . SVD (spontaneous vaginal delivery)    x 2    Past Surgical History:  Procedure Laterality Date  . COLONOSCOPY    . CYSTOSCOPY    . EYE SURGERY  05/2012   macular hole - left  . EYE SURGERY  2014   cataract removed left eye  . HYSTEROSCOPY W/D&C  03/06/2013   Procedure: DILATATION AND CURETTAGE /HYSTEROSCOPY;  Surgeon: Margarette Asal, MD;  Location: [facility];  Service: Gynecology;;  . LAPAROSCOPIC ASSISTED VAGINAL HYSTERECTOMY N/A 06/27/2014   Procedure: LAPAROSCOPIC ASSISTED VAGINAL HYSTERECTOMY WITH BILATERAL SALPINGO OOPHORECTOMY;  Surgeon: Margarette Asal, MD;  Location: [facility];  Service: Gynecology;  Laterality: N/A;  . SALPINGOOPHORECTOMY Bilateral 06/27/2014   Procedure: SALPINGO OOPHORECTOMY;  Surgeon: Margarette Asal, MD;  Location: [facility];  Service: Gynecology;  Laterality: Bilateral;  . TONSILLECTOMY    . WISDOM TOOTH EXTRACTION      There were no vitals filed for this visit.  Subjective Assessment - 05/11/18 0851    Subjective  Pt reports that  things are going ok. She has some improvements following the massage last session. She still has limitations in activity participation with her great granddaughter.     Pertinent History  carpal tunnel testing was negative on the Lt    Limitations  Writing;Lifting;Other (comment)    Patient Stated Goals  improve UE strength     Currently in Pain?  Yes    Pain Score  8    4/10 end of session   Pain Location  --   Lt forearm/lateral elbow   Pain Orientation  Left;Lateral    Pain Descriptors / Indicators  Aching    Pain Type  Acute pain    Pain Radiating Towards  along outside of elbow up towards the shoulder and down to the forearm    Pain Onset  More than a month ago    Pain Frequency  Intermittent    Aggravating Factors   alot of activity     Pain Relieving Factors  hot pack, rest, massage     Effect of Pain on Daily Activities  limited ability to complete daily activity with her daughter                       [facility] Adult PT Treatment/Exercise - 05/11/18 0001      Exercises   Exercises  Neck  Neck Exercises: Machines for Strengthening   UBE (Upper Arm Bike)  L1 x2 min forward due to reports of increase forearm pain with backwards (PT cues to decrease radial/ulnar deviation on the Lt and focus on shoulder activation)      Neck Exercises: Seated   Neck Retraction  15 reps    Neck Retraction Limitations  pt overpressure, verbal cues for proper technique      Hand Exercises for Cervical Radiculopathy   Other Hand Exercise for Cervical Radiculopathy  self massage to thumb muscle groups       Modalities   Modalities  Cryotherapy      Cryotherapy   Number Minutes Cryotherapy  5 Minutes    Cryotherapy Location  Hand   Lt thumb with pressure   Type of Cryotherapy  Ice pack      Manual Therapy   Manual Therapy  Myofascial release    Joint Mobilization  Grade III-IV Lt cervical PAs C5, C6, C7; Grade III-IV CPAs T4 to T1     Soft tissue mobilization  STM Lt  wrist extensors, STM Lt lateral tricep, Lt bicep head    Myofascial Release  trigger point release Lt adductor/flexor policis, trigger point release Lt wrist extensor group, Lt infraspinatus             PT Education - 05/11/18 0947    Education Details  technique with therex; implications for ice to decrease swelling from possible joint irritation    Person(s) Educated  Patient    Methods  Explanation;Verbal cues    Comprehension  Verbalized understanding;Returned demonstration       PT Short Term Goals - 05/08/18 0848      PT SHORT TERM GOAL #2   Title  Pt will be able to verbalize proper posture adjustments to be made while reading to take strain off of her neck.     Time  4    Period  Weeks    Status  Achieved      PT SHORT TERM GOAL #3   Title  Pt will report atleast 30% decrease in her Lt and Rt forearm pain from the start of PT.    Pt reports some days she has 30% improvement but not all days of the week.   Time  4    Period  Weeks    Status  On-going        PT Long Term Goals - 04/21/18 0941      PT LONG TERM GOAL #1   Title  Pt will have atleast 4/5 MMT strength of the shoulders and forearms to improve her efficiency and mechanics with daily activity.     Time  8    Period  Weeks    Status  New    Target Date  06/23/18      PT LONG TERM GOAL #2   Title  Pt will report being able to lift her grand daughter with no more than 3/10 discomfort during the day.     Time  8    Period  Weeks    Status  New      PT LONG TERM GOAL #3   Title  Pt will report atleast 70% reduction in forearm pain with activity.    Time  8    Period  Weeks    Status  New      PT LONG TERM GOAL #4   Title  Pt will have atleast 5 lb improvement in her  grip strength from the start of PT, to improve her ability to peel potatoes at home.     Time  8    Period  Weeks    Status  New            Plan - 05/11/18 0948    Clinical Impression Statement  Pt has reports of fair  improvement in her elbow and thumb pain with daily activity and HEP adherence. Due to pt's consistency with HEP, and high report of pain upon arrival (8/10), a majority of this session was spent on manual techniques to decrease muscle tension and improve mobility of the cervical/thoracic spine. Pt does have increased awareness of her posture while seated and throughout the day. Pt was encouraged to make adjustments with grip during her HEP adherence and was educated on benefits of ice application for swelling/pain in the thumb following activity. End of session, pt had good understanding of HEP updates and reported decrease in lateral elbow pain to 4/10 compared to her arrival. Will continue with current POC to facilitate return to caring for her great granddaughter without limitation.     Rehab Potential  Good    PT Frequency  2x / week    PT Duration  8 weeks    PT Treatment/Interventions  ADLs/Self Care Home Management;Moist Heat;Patient/family education;Manual techniques;Passive range of motion;Therapeutic exercise;Electrical Stimulation;Iontophoresis 4mg /ml Dexamethasone;Cryotherapy;Therapeutic activities;Neuromuscular re-education;Spinal Manipulations;Dry needling    PT Next Visit Plan  f/u on ice/massage to thumb; STM to wrist extensors/bicep/tricep as needed; continue with manual and exercise to increase cervical/thoracic mobility     PT Home Exercise Plan  Access Code: [email or web address]    Consulted and Agree with Plan of Care  Patient       Patient will benefit from skilled therapeutic intervention in order to improve the following deficits and impairments:  Decreased strength, Impaired flexibility, Impaired UE functional use, Postural dysfunction, Pain, Improper body mechanics, Decreased range of motion, Decreased mobility, Decreased endurance, Increased muscle spasms, Hypomobility  Visit Diagnosis: Abnormal posture  Pain in left forearm  Pain in right forearm  Other muscle  spasm     Problem List Patient Active Problem List   Diagnosis Date Noted  . Cervical radiculopathy 01/30/2018  . Lateral epicondylitis of both elbows 01/30/2018  . Arthritis of hand, degenerative 11/29/2017  . Heart murmur 11/24/2016  . Peripheral neuropathy 11/24/2016  . Thrombocytosis ([place]) 11/24/2016  . Viral illness 11/25/2015  . Hyperglycemia 11/25/2015  . Dysplasia of cervix 06/27/2014  . Routine health maintenance 04/26/2011  . Diverticulitis of colon 04/07/2009  . Hyperlipidemia 05/03/2007  . Essential hypertension 05/03/2007     9:56 AM,05/11/18 Sherol Dade PT, DPT [place] [facility] [place]  [facility] [address], [place], [place], [address] Phone: [phone]   Fax:  [phone]  Name: Julia Sanchez MRN: [id number] Date of Birth: 1945-11-22

## 2018-05-15 ENCOUNTER — Encounter: Payer: Self-pay | Admitting: Physical Therapy

## 2018-05-15 ENCOUNTER — Ambulatory Visit: Payer: Medicare Other | Admitting: Physical Therapy

## 2018-05-15 DIAGNOSIS — M79632 Pain in left forearm: Secondary | ICD-10-CM | POA: Diagnosis not present

## 2018-05-15 DIAGNOSIS — R293 Abnormal posture: Secondary | ICD-10-CM

## 2018-05-15 DIAGNOSIS — M79631 Pain in right forearm: Secondary | ICD-10-CM

## 2018-05-15 DIAGNOSIS — M62838 Other muscle spasm: Secondary | ICD-10-CM | POA: Diagnosis not present

## 2018-05-15 NOTE — Therapy (Signed)
[facility] [address] [place], [place], [address] Phone: [phone]   Fax:  [phone]  Physical Therapy Treatment  Patient Details  Name: Julia Sanchez MRN: [id number] Date of Birth: [birth year] Referring Provider (PT): Clearance Coots, MD    Encounter Date: 05/15/2018  PT End of Session - 05/15/18 0845    Visit Number  8    Date for PT Re-Evaluation  06/23/18    Authorization Type  Medicare A and B    Authorization Time Period  04/21/18 to 06/23/18    PT Start Time  0845    PT Stop Time  0927    PT Time Calculation (min)  42 min    Activity Tolerance  No increased pain;Patient tolerated treatment well    Behavior During Therapy  [facility] for tasks assessed/performed       Past Medical History:  Diagnosis Date  . Anxiety    due to stress - no stress  . DIVERTICULOSIS, COLON 04/07/2009  . HYPERLIPIDEMIA 05/03/2007  . HYPERTENSION 05/03/2007  . SVD (spontaneous vaginal delivery)    x 2    Past Surgical History:  Procedure Laterality Date  . COLONOSCOPY    . CYSTOSCOPY    . EYE SURGERY  05/2012   macular hole - left  . EYE SURGERY  2014   cataract removed left eye  . HYSTEROSCOPY W/D&C  03/06/2013   Procedure: DILATATION AND CURETTAGE /HYSTEROSCOPY;  Surgeon: Margarette Asal, MD;  Location: [facility];  Service: Gynecology;;  . LAPAROSCOPIC ASSISTED VAGINAL HYSTERECTOMY N/A 06/27/2014   Procedure: LAPAROSCOPIC ASSISTED VAGINAL HYSTERECTOMY WITH BILATERAL SALPINGO OOPHORECTOMY;  Surgeon: Margarette Asal, MD;  Location: [facility];  Service: Gynecology;  Laterality: N/A;  . SALPINGOOPHORECTOMY Bilateral 06/27/2014   Procedure: SALPINGO OOPHORECTOMY;  Surgeon: Margarette Asal, MD;  Location: [facility];  Service: Gynecology;  Laterality: Bilateral;  . TONSILLECTOMY    . WISDOM TOOTH EXTRACTION      There were no vitals filed for this visit.  Subjective Assessment - 05/15/18 0848    Subjective  Pt reports she  had improved Lt UE pain over the weekend after last treatment and noticed less swelling in Lt palm over the weekend.  She reports she is more aware of adjusting her neck and shoulder posture as her new pattern.    Pertinent History  carpal tunnel testing was negative on the Lt    Limitations  Writing;Lifting;Other (comment)    Patient Stated Goals  improve UE strength     Pain Score  6     Pain Location  Arm    Pain Orientation  Left    Pain Descriptors / Indicators  Tiring    Pain Type  Chronic pain    Pain Onset  More than a month ago    Aggravating Factors   repeated lifting, prolonged housework and cooking    Pain Relieving Factors  hot pack, rest, massage    Effect of Pain on Daily Activities  housework, caring for great granddaughter                       [facility] Adult PT Treatment/Exercise - 05/15/18 0001      Exercises   Exercises  Neck;Shoulder      Neck Exercises: Machines for Strengthening   UBE (Upper Arm Bike)  level 1 x 6' alt fwd/bwd each min      Shoulder Exercises: Standing   External Rotation  Strengthening    Theraband Level (Shoulder External Rotation)  Level 2 (Red)    External Rotation Limitations  10 reps    PT cued to maintain neck retraction   Extension  Strengthening;Both;15 reps    Theraband Level (Shoulder Extension)  Level 1 (Yellow)   PT provided TC for scapular retraction   Extension Limitations  with neck retraction    Row  AROM;15 reps   max TC and VC by PT for scapular retraction   Theraband Level (Shoulder Row)  --   in split stance squat simulating lifting a toy or toddler   Retraction  Strengthening;10 reps    Retraction Limitations  AROM with neck retraction ball on wall    Other Standing Exercises  doorway stretch 2x20 sec               PT Short Term Goals - 05/08/18 0848      PT SHORT TERM GOAL #2   Title  Pt will be able to verbalize proper posture adjustments to be made while reading to take strain off of her  neck.     Time  4    Period  Weeks    Status  Achieved      PT SHORT TERM GOAL #3   Title  Pt will report atleast 30% decrease in her Lt and Rt forearm pain from the start of PT.    Pt reports some days she has 30% improvement but not all days of the week.   Time  4    Period  Weeks    Status  On-going        PT Long Term Goals - 04/21/18 0941      PT LONG TERM GOAL #1   Title  Pt will have atleast 4/5 MMT strength of the shoulders and forearms to improve her efficiency and mechanics with daily activity.     Time  8    Period  Weeks    Status  New    Target Date  06/23/18      PT LONG TERM GOAL #2   Title  Pt will report being able to lift her grand daughter with no more than 3/10 discomfort during the day.     Time  8    Period  Weeks    Status  New      PT LONG TERM GOAL #3   Title  Pt will report atleast 70% reduction in forearm pain with activity.    Time  8    Period  Weeks    Status  New      PT LONG TERM GOAL #4   Title  Pt will have atleast 5 lb improvement in her grip strength from the start of PT, to improve her ability to peel potatoes at home.     Time  8    Period  Weeks    Status  New            Plan - 05/15/18 0916    Clinical Impression Statement  Pt had improved pain after last visit which lasted over the weekend.  PT focused today on functional incorporation of scapular and neck stabilization.  PT required mod-max tactile cueing for scapular retraction/depression during theraband and AROM in strengthening and functional patterns. Her ability to maintain neck retraction with bending UE movement task simulation is improving.  Her Lt UE was fatigued end of session but no increased pain.  She will continue to benefit  from skilled progression of cervical and scapular stabilization with ongoing incorporation of functional movement patterns to increase her tolerance of household tasks and childcare with improved pain and posture.    Rehab Potential  Good     PT Frequency  2x / week    PT Duration  8 weeks    PT Treatment/Interventions  ADLs/Self Care Home Management;Moist Heat;Patient/family education;Manual techniques;Passive range of motion;Therapeutic exercise;Electrical Stimulation;Iontophoresis 4mg /ml Dexamethasone;Cryotherapy;Therapeutic activities;Neuromuscular re-education;Spinal Manipulations;Dry needling    PT Next Visit Plan  f/u on how childcare went, continue manual therapy and skilled progression of cervical and scapular stabilization as tolerated    PT Home Exercise Plan  Access Code: [email or web address]    Consulted and Agree with Plan of Care  Patient       Patient will benefit from skilled therapeutic intervention in order to improve the following deficits and impairments:  Decreased strength, Impaired flexibility, Impaired UE functional use, Postural dysfunction, Pain, Improper body mechanics, Decreased range of motion, Decreased mobility, Decreased endurance, Increased muscle spasms, Hypomobility  Visit Diagnosis: Abnormal posture  Pain in left forearm  Pain in right forearm     Problem List Patient Active Problem List   Diagnosis Date Noted  . Cervical radiculopathy 01/30/2018  . Lateral epicondylitis of both elbows 01/30/2018  . Arthritis of hand, degenerative 11/29/2017  . Heart murmur 11/24/2016  . Peripheral neuropathy 11/24/2016  . Thrombocytosis ([place]) 11/24/2016  . Viral illness 11/25/2015  . Hyperglycemia 11/25/2015  . Dysplasia of cervix 06/27/2014  . Routine health maintenance 04/26/2011  . Diverticulitis of colon 04/07/2009  . Hyperlipidemia 05/03/2007  . Essential hypertension 05/03/2007   Baruch Merl, PT 05/15/18 9:23 AM    [facility] [address] [place], [place], [address] Phone: [phone]   Fax:  [phone]  Name: MALASHA KLEPPE MRN: [id number] Date of Birth: 07/31/1946

## 2018-05-18 ENCOUNTER — Ambulatory Visit: Payer: Medicare Other | Admitting: Physical Therapy

## 2018-05-18 ENCOUNTER — Encounter: Payer: Self-pay | Admitting: Physical Therapy

## 2018-05-18 DIAGNOSIS — M62838 Other muscle spasm: Secondary | ICD-10-CM

## 2018-05-18 DIAGNOSIS — M79632 Pain in left forearm: Secondary | ICD-10-CM

## 2018-05-18 DIAGNOSIS — M79631 Pain in right forearm: Secondary | ICD-10-CM | POA: Diagnosis not present

## 2018-05-18 DIAGNOSIS — R293 Abnormal posture: Secondary | ICD-10-CM

## 2018-05-18 NOTE — Patient Instructions (Signed)
Access Code: [email or web address]  URL: [email or web address]  Date: 05/18/2018  Prepared by: Sherol Dade   Exercises  Seated Cervical Retraction - 10 reps - 3 sets - 10 hold - 1x daily - 7x weekly  Seated Isometric Cervical Sidebending - 10 reps - 3 sets - 10 hold - 1x daily - 7x weekly  Standing Isometric Cervical Rotation - 10 reps - 3 sets - 10 hold - 1x daily - 7x weekly  Putty Squeezes - 3 sets - 60 hold - 1x daily - 7x weekly  Sidelying Thoracic Rotation with Open Book - 10 reps - 3 sets - 1x daily - 7x weekly  Standing Wrist Flexion Stretch - 3 reps - 20 hold - 1x daily - 7x weekly  Seated Eccentric Wrist Extension - 10 reps - 2 sets - 1x daily - 7x weekly  Supine Shoulder Horizontal Abduction with Resistance - 10 reps - 3 sets - 1x daily - 7x weekly  Patient Education  [facility] [address] [place], [place] [address] Phone # [phone] Fax [phone]

## 2018-05-18 NOTE — Therapy (Signed)
[facility] [address] [place], [place], [address] Phone: [phone]   Fax:  [phone]  Physical Therapy Treatment  Patient Details  Name: Julia Sanchez MRN: [id number] Date of Birth: 1946/02/20 Referring Provider (PT): Clearance Coots, MD    Encounter Date: 05/18/2018  PT End of Session - 05/18/18 0937    Visit Number  9    Date for PT Re-Evaluation  06/23/18    Authorization Type  Medicare A and B    Authorization Time Period  04/21/18 to 06/23/18    PT Start Time  0845    PT Stop Time  0933    PT Time Calculation (min)  48 min    Activity Tolerance  No increased pain;Patient tolerated treatment well    Behavior During Therapy  [facility] for tasks assessed/performed       Past Medical History:  Diagnosis Date  . Anxiety    due to stress - no stress  . DIVERTICULOSIS, COLON 04/07/2009  . HYPERLIPIDEMIA 05/03/2007  . HYPERTENSION 05/03/2007  . SVD (spontaneous vaginal delivery)    x 2    Past Surgical History:  Procedure Laterality Date  . COLONOSCOPY    . CYSTOSCOPY    . EYE SURGERY  05/2012   macular hole - left  . EYE SURGERY  2014   cataract removed left eye  . HYSTEROSCOPY W/D&C  03/06/2013   Procedure: DILATATION AND CURETTAGE /HYSTEROSCOPY;  Surgeon: Margarette Asal, MD;  Location: [facility];  Service: Gynecology;;  . LAPAROSCOPIC ASSISTED VAGINAL HYSTERECTOMY N/A 06/27/2014   Procedure: LAPAROSCOPIC ASSISTED VAGINAL HYSTERECTOMY WITH BILATERAL SALPINGO OOPHORECTOMY;  Surgeon: Margarette Asal, MD;  Location: [facility];  Service: Gynecology;  Laterality: N/A;  . SALPINGOOPHORECTOMY Bilateral 06/27/2014   Procedure: SALPINGO OOPHORECTOMY;  Surgeon: Margarette Asal, MD;  Location: [facility];  Service: Gynecology;  Laterality: Bilateral;  . TONSILLECTOMY    . WISDOM TOOTH EXTRACTION      There were no vitals filed for this visit.  Subjective Assessment - 05/18/18 0850    Subjective  Pt reports that  things are going well. She feels like the manual work really helped. She had to watch her great granddaughter and did alot of lifting which seemed to really aggravate her elbow pain.   (Pended)     Pertinent History  carpal tunnel testing was negative on the Lt  (Pended)     Limitations  Writing;Lifting;Other (comment)  (Pended)     Patient Stated Goals  improve UE strength   (Pended)     Currently in Pain?  Yes  (Pended)     Pain Score  7   (Pended)     Pain Location  Elbow  (Pended)     Pain Orientation  Left  (Pended)     Pain Descriptors / Indicators  Aching  (Pended)     Pain Type  Chronic pain  (Pended)     Pain Radiating Towards  elbow and up the tricep   (Pended)     Pain Onset  More than a month ago  (Pended)     Pain Frequency  Intermittent  (Pended)     Aggravating Factors   repetitive activity   (Pended)     Pain Relieving Factors  ice, rest, massage  (Pended)                        [facility] Adult PT Treatment/Exercise - 05/18/18 0001  Exercises   Exercises  Wrist;Shoulder;Other Exercises    Other Exercises   Lt wrist extensor stretch 3x20 sec       Shoulder Exercises: Seated   Horizontal ABduction  Both;10 reps    Theraband Level (Shoulder Horizontal ABduction)  Level 1 (Yellow)   x5 reps with red, decreased to yellow   Horizontal ABduction Limitations  cues for proper shoulder muscle activation      Wrist Exercises   Wrist Extension  Left;10 reps    Bar Weights/Barbell (Wrist Extension)  2 lbs    Wrist Extension Limitations  eccentric      Modalities   Modalities  Cryotherapy      Cryotherapy   Number Minutes Cryotherapy  7 Minutes    Cryotherapy Location  Forearm   Lt thumb   Type of Cryotherapy  Ice pack      Manual Therapy   Soft tissue mobilization  STM Lt wrist extensor group, bicep and brachialis    Myofascial Release  trigger point release Lt adductor/flexor policis             PT Education - 05/18/18 0936    Education  Details  updates to HEP; importance of ice/rest    Person(s) Educated  Patient    Methods  Explanation;Verbal cues;Handout    Comprehension  Verbalized understanding;Returned demonstration       PT Short Term Goals - 05/08/18 0848      PT SHORT TERM GOAL #2   Title  Pt will be able to verbalize proper posture adjustments to be made while reading to take strain off of her neck.     Time  4    Period  Weeks    Status  Achieved      PT SHORT TERM GOAL #3   Title  Pt will report atleast 30% decrease in her Lt and Rt forearm pain from the start of PT.    Pt reports some days she has 30% improvement but not all days of the week.   Time  4    Period  Weeks    Status  On-going        PT Long Term Goals - 04/21/18 0941      PT LONG TERM GOAL #1   Title  Pt will have atleast 4/5 MMT strength of the shoulders and forearms to improve her efficiency and mechanics with daily activity.     Time  8    Period  Weeks    Status  New    Target Date  06/23/18      PT LONG TERM GOAL #2   Title  Pt will report being able to lift her grand daughter with no more than 3/10 discomfort during the day.     Time  8    Period  Weeks    Status  New      PT LONG TERM GOAL #3   Title  Pt will report atleast 70% reduction in forearm pain with activity.    Time  8    Period  Weeks    Status  New      PT LONG TERM GOAL #4   Title  Pt will have atleast 5 lb improvement in her grip strength from the start of PT, to improve her ability to peel potatoes at home.     Time  8    Period  Weeks    Status  New  Plan - 05/18/18 0946    Clinical Impression Statement  Pt reports overall improvements in her pain since beginning PT. Recently she had decrease in her elbow/arm pain, however her inability to rest her arm is making it difficult to allow it to heal properly. Pt continues to respond well to manual treatment reporting decrease in her pain from arrival. Therapist also made some  adjustments to improve posterior shoulder and scapular muscle activation with theraband exercises she is completing at home.  Pt had good understanding of these updates by the end of the session. Will continue to encourage activity modification, progress strength and HEP as able.     Rehab Potential  Good    PT Frequency  2x / week    PT Duration  8 weeks    PT Treatment/Interventions  ADLs/Self Care Home Management;Moist Heat;Patient/family education;Manual techniques;Passive range of motion;Therapeutic exercise;Electrical Stimulation;Iontophoresis 4mg /ml Dexamethasone;Cryotherapy;Therapeutic activities;Neuromuscular re-education;Spinal Manipulations;Dry needling    PT Next Visit Plan  gentle isometrics of wrist extensors if able to complete pain free; eccentric pronation/supination; posterior shoulder strength and continue manual therapy and skilled progression of cervical strength/mobility    PT Home Exercise Plan  Access Code: [email or web address]    Consulted and Agree with Plan of Care  Patient       Patient will benefit from skilled therapeutic intervention in order to improve the following deficits and impairments:  Decreased strength, Impaired flexibility, Impaired UE functional use, Postural dysfunction, Pain, Improper body mechanics, Decreased range of motion, Decreased mobility, Decreased endurance, Increased muscle spasms, Hypomobility  Visit Diagnosis: Abnormal posture  Pain in left forearm  Pain in right forearm  Other muscle spasm     Problem List Patient Active Problem List   Diagnosis Date Noted  . Cervical radiculopathy 01/30/2018  . Lateral epicondylitis of both elbows 01/30/2018  . Arthritis of hand, degenerative 11/29/2017  . Heart murmur 11/24/2016  . Peripheral neuropathy 11/24/2016  . Thrombocytosis ([place]) 11/24/2016  . Viral illness 11/25/2015  . Hyperglycemia 11/25/2015  . Dysplasia of cervix 06/27/2014  . Routine health maintenance 04/26/2011  . Diverticulitis of  colon 04/07/2009  . Hyperlipidemia 05/03/2007  . Essential hypertension 05/03/2007    10:39 AM,05/18/18 Sherol Dade PT, DPT [place] [facility] [place]  [facility] [address] [place], [place], [address] Phone: [phone]   Fax:  [phone]  Name: Julia Sanchez MRN: [id number] Date of Birth: 11/09/1945

## 2018-05-22 ENCOUNTER — Ambulatory Visit: Payer: Medicare Other | Admitting: Physical Therapy

## 2018-05-22 ENCOUNTER — Encounter: Payer: Self-pay | Admitting: Physical Therapy

## 2018-05-22 DIAGNOSIS — R293 Abnormal posture: Secondary | ICD-10-CM | POA: Diagnosis not present

## 2018-05-22 DIAGNOSIS — M79632 Pain in left forearm: Secondary | ICD-10-CM | POA: Diagnosis not present

## 2018-05-22 DIAGNOSIS — M79631 Pain in right forearm: Secondary | ICD-10-CM | POA: Diagnosis not present

## 2018-05-22 DIAGNOSIS — M62838 Other muscle spasm: Secondary | ICD-10-CM | POA: Diagnosis not present

## 2018-05-22 NOTE — Therapy (Signed)
[facility] [address] [place], [place], [address] Phone: [phone]   Fax:  [phone]  Physical Therapy Treatment  Patient Details  Name: Julia Sanchez MRN: [id number] Date of Birth: [birth year] Referring Provider (PT): Clearance Coots, MD    Encounter Date: 05/22/2018  PT End of Session - 05/22/18 0924    Visit Number  10    Date for PT Re-Evaluation  06/23/18    Authorization Type  Medicare A and B    Authorization Time Period  04/21/18 to 06/23/18    PT Start Time  0926    PT Stop Time  1010    PT Time Calculation (min)  44 min    Activity Tolerance  No increased pain;Patient tolerated treatment well    Behavior During Therapy  [facility] for tasks assessed/performed       Past Medical History:  Diagnosis Date  . Anxiety    due to stress - no stress  . DIVERTICULOSIS, COLON 04/07/2009  . HYPERLIPIDEMIA 05/03/2007  . HYPERTENSION 05/03/2007  . SVD (spontaneous vaginal delivery)    x 2    Past Surgical History:  Procedure Laterality Date  . COLONOSCOPY    . CYSTOSCOPY    . EYE SURGERY  05/2012   macular hole - left  . EYE SURGERY  2014   cataract removed left eye  . HYSTEROSCOPY W/D&C  03/06/2013   Procedure: DILATATION AND CURETTAGE /HYSTEROSCOPY;  Surgeon: Margarette Asal, MD;  Location: [facility];  Service: Gynecology;;  . LAPAROSCOPIC ASSISTED VAGINAL HYSTERECTOMY N/A 06/27/2014   Procedure: LAPAROSCOPIC ASSISTED VAGINAL HYSTERECTOMY WITH BILATERAL SALPINGO OOPHORECTOMY;  Surgeon: Margarette Asal, MD;  Location: [facility];  Service: Gynecology;  Laterality: N/A;  . SALPINGOOPHORECTOMY Bilateral 06/27/2014   Procedure: SALPINGO OOPHORECTOMY;  Surgeon: Margarette Asal, MD;  Location: [facility];  Service: Gynecology;  Laterality: Bilateral;  . TONSILLECTOMY    . WISDOM TOOTH EXTRACTION      There were no vitals filed for this visit.  Subjective Assessment - 05/22/18 0925    Subjective  Pt states that  she has learned to be more aware of practicing improved posture but she does not feel she has noticed a great deal of relief of her Lt forearm pain and bil hand numbness.  She continues to have upper body demands with her great granddaughter and housework which always trigger worsening of symptoms.  Her daughter is going to try to see if she can get her great granddaughter into full time daycare as she feels all the lifting/carrying/care of her is exacerbating her symptoms and preventing healing and improvement.  She feels comfortable with the exercises but has trouble finding time to get them all in.  She suspects she has a chronic condition that may not improve with PT unless she can avoid caring for her great granddaughter.  Other tasks that bother her she understands she can take breaks from and do in smaller doses.    Pertinent History  carpal tunnel testing was negative on the Lt    Limitations  --   tolerance of lifting/caring for great granddaughter, housework   Patient Stated Goals  improve UE strength, relieve pain    Currently in Pain?  Yes    Pain Score  6     Pain Location  Arm    Pain Orientation  Left    Pain Descriptors / Indicators  Tightness;Tiring    Pain Type  Chronic pain  Pain Radiating Towards  concentrated around Lt elbow into posterior forearm, numbness first two digits and palm of bil hands on palmar side    Pain Onset  More than a month ago    Pain Frequency  Constant   varies in intensity   Aggravating Factors   repeated lifting, carrying, prolonged housework    Pain Relieving Factors  laying down, morning after good night of rest, heat, rest, massage    Effect of Pain on Daily Activities  caring for great granddaughter, housework, yard work         [employer] PT Assessment - 05/22/18 0001      Observation/Other Assessments   Focus on Therapeutic Outcomes (FOTO)   34% limited      Sensation   Additional Comments  numbness Lt>Rt palm and palmar aspect of bil thumb  and second digit      Posture/Postural Control   Posture Comments  increased thoracic kyphosis T1-T5, improved control of forward head noted      ROM / Strength   AROM / PROM / Strength  Strength      Strength   Overall Strength  Deficits    Overall Strength Comments  bil C8 thumb abduction 2-/5, grip strength Rt 47lb, Lt 42lb    Strength Assessment Site  Shoulder;Elbow    Right/Left Shoulder  Left    Left Shoulder Flexion  4-/5    Left Shoulder Extension  4-/5    Left Shoulder ABduction  4-/5    Left Shoulder Internal Rotation  4/5    Left Shoulder External Rotation  3+/5    Right/Left Elbow  Left    Left Elbow Flexion  4-/5    Left Elbow Extension  3+/5      Palpation   Palpation comment  palpable tenderness along wrist extensors, adductor policis                   [facility] Adult PT Treatment/Exercise - 05/22/18 0001      Exercises   Exercises  Neck      Elbow Exercises   Elbow Extension  Strengthening;15 reps;Theraband    Theraband Level (Elbow Extension)  Level 1 (Yellow)      Neck Exercises: Machines for Strengthening   UBE (Upper Arm Bike)  level 1 x 6' alt fwd/bwd each min      Neck Exercises: Seated   Cervical Isometrics  Right lateral flexion;Left lateral flexion;Right rotation;Left rotation;5 secs;5 reps   reviewed for HEP     Shoulder Exercises: Standing   External Rotation  Strengthening    Theraband Level (Shoulder External Rotation)  Level 2 (Red)    External Rotation Limitations  15 reps    PT cued to maintain neck retraction   Extension  Strengthening;Both;15 reps    Theraband Level (Shoulder Extension)  Level 1 (Yellow)   PT provided TC for scapular retraction   Extension Limitations  with neck retraction    Row  Strengthening;15 reps;Theraband    Theraband Level (Shoulder Row)  Level 1 (Yellow)    Other Standing Exercises  IR red theraband x 15 reps             PT Education - 05/22/18 1956    Education Details  Access Code:  [email or web address].  Added shoulder and tricep extension theraband strengthening    Person(s) Educated  Patient    Methods  Explanation;Demonstration;Verbal cues;Handout    Comprehension  Verbalized understanding;Returned demonstration       PT Short  Term Goals - 05/22/18 2020      PT SHORT TERM GOAL #1   Title  Pt will demo consistency and independence with her initial HEP to improve posture and decrease pain with daily activity.    independent with HEP, daily pain is higher on child-care days   Time  4    Period  Weeks    Status  Partially Met      PT SHORT TERM GOAL #2   Title  Pt will be able to verbalize proper posture adjustments to be made while reading to take strain off of her neck.     Time  4    Period  Weeks    Status  Achieved      PT SHORT TERM GOAL #3   Title  Pt will report atleast 30% decrease in her Lt and Rt forearm pain from the start of PT.    Pt reports 30% improvement some days of the week but not consistent   Time  4    Period  Weeks    Status  Partially Met        PT Long Term Goals - 05/22/18 0936      PT LONG TERM GOAL #1   Title  Pt will have atleast 4/5 MMT strength of the shoulders and forearms to improve her efficiency and mechanics with daily activity.    Pt with 5/5 throughout Rt UE, see MMT measures for Lt UE   Time  8    Period  Weeks    Status  Partially Met      PT LONG TERM GOAL #2   Title  Pt will report being able to lift her grand daughter with no more than 3/10 discomfort during the day.    Pt states her pain reaches 8/10 with repeated lifting of great granddaughter.    Time  8    Period  Weeks    Status  Not Met      PT LONG TERM GOAL #3   Title  Pt will report at least 70% reduction in forearm pain with activity.   Pt reports she continues to be at baseline pain with activity.   Time  8    Period  Weeks    Status  Not Met      PT LONG TERM GOAL #4   Title  Pt will have atleast 5 lb improvement in her grip strength from the  start of PT, to improve her ability to peel potatoes at home.    Rt 42lb, Lt 47lb   Time  8    Period  Weeks    Status  Partially Met            Plan - 05/22/18 2013    Clinical Impression Statement  Pt reported she feels her pain improvement is at a stand still.  She reports she has a greater understanding of posural adjustments, activity dosage, pain management strategies, and exercises to get stronger.  However, both the Pt and the PT are aware that she has not been able to see progress in her pain levels due to high demand of upper body activity related to the care of her young great granddaughter several times a week.  Alternative options for day care are being explored but in the meantime, Pt felt that she would like to be D/C'd to HEP and continue independently with her pain management strategies and modalities.  She made some functional progress as indicated  by her FOTO score reduction of limitation from 38% to 34%.  She was encouraged to return to PT as needed.    Clinical Presentation due to:  worsening over time.    PT Next Visit Plan  D/C to HEP with f/u as needed.    PT Home Exercise Plan  Access Code: [email or web address]       Patient will benefit from skilled therapeutic intervention in order to improve the following deficits and impairments:     Visit Diagnosis: Pain in left forearm  Abnormal posture  Pain in right forearm     Problem List Patient Active Problem List   Diagnosis Date Noted  . Cervical radiculopathy 01/30/2018  . Lateral epicondylitis of both elbows 01/30/2018  . Arthritis of hand, degenerative 11/29/2017  . Heart murmur 11/24/2016  . Peripheral neuropathy 11/24/2016  . Thrombocytosis ([place]) 11/24/2016  . Viral illness 11/25/2015  . Hyperglycemia 11/25/2015  . Dysplasia of cervix 06/27/2014  . Routine health maintenance 04/26/2011  . Diverticulitis of colon 04/07/2009  . Hyperlipidemia 05/03/2007  . Essential hypertension 05/03/2007   Baruch Merl, PT 05/22/18 8:26 PM  PHYSICAL THERAPY DISCHARGE SUMMARY  Visits from Start of Care: 10  Current functional level related to goals / functional outcomes: Improved postural awareness, strength, FOTO score, independent in HEP   Remaining deficits: Ongoing chronic pain in Lt>Rt forearm (worse with caring for great granddaughter and house/yardwork), bil hand numbness (see above), bil grip strength and thumb weakness, Lt shoulder/elbow weakness as compared to Rt UE (see above)   Education / Equipment: HEP, theraband Plan: Patient agrees to discharge.  Patient goals were partially met. Patient is being discharged due to lack of progress.  ?????        [facility] [address] [place], [place], [address] Phone: [phone]   Fax:  [phone]  Name: NERIDA BOIVIN MRN: [id number] Date of Birth: [birth year]

## 2018-05-22 NOTE — Patient Instructions (Signed)
Access Code: [email or web address]  URL: [email or web address]  Date: 05/22/2018  Prepared by: Venetia Night Joshua Soulier   Exercises  Seated Cervical Retraction - 10 reps - 3 sets - 10 hold - 1x daily - 7x weekly  Seated Isometric Cervical Sidebending - 10 reps - 3 sets - 10 hold - 1x daily - 7x weekly  Standing Isometric Cervical Rotation - 10 reps - 3 sets - 10 hold - 1x daily - 7x weekly  Putty Squeezes - 3 sets - 60 hold - 1x daily - 7x weekly  Sidelying Thoracic Rotation with Open Book - 10 reps - 3 sets - 1x daily - 7x weekly  Standing Wrist Flexion Stretch - 3 reps - 20 hold - 1x daily - 7x weekly  Seated Eccentric Wrist Extension - 10 reps - 2 sets - 1x daily - 7x weekly  Supine Shoulder Horizontal Abduction with Resistance - 10 reps - 3 sets - 1x daily - 7x weekly  Standing Row with Anchored Resistance - 10 reps - 3 sets - 1x daily - 7x weekly  Standing Elbow Extension with Anchored Resistance - 10 reps - 3 sets - 1x daily - 7x weekly  Standing Shoulder External Rotation with Resistance - 10 reps - 3 sets - 1x daily - 7x weekly  Shoulder Internal Rotation with Resistance - 10 reps - 3 sets - 1x daily - 7x weekly  Patient Education  Holding and Carrying a [employer]

## 2018-05-25 ENCOUNTER — Ambulatory Visit: Payer: Medicare Other | Admitting: Physical Therapy

## 2018-05-29 ENCOUNTER — Ambulatory Visit: Payer: Medicare Other | Admitting: Physical Therapy

## 2018-06-01 ENCOUNTER — Encounter: Payer: Medicare Other | Admitting: Physical Therapy

## 2018-06-05 ENCOUNTER — Ambulatory Visit: Payer: Medicare Other | Admitting: Physical Therapy

## 2018-06-08 ENCOUNTER — Encounter: Payer: Medicare Other | Admitting: Physical Therapy

## 2018-06-12 ENCOUNTER — Ambulatory Visit: Payer: Medicare Other | Admitting: Physical Therapy

## 2018-06-15 ENCOUNTER — Encounter: Payer: Medicare Other | Admitting: Physical Therapy

## 2018-06-29 DIAGNOSIS — Z23 Encounter for immunization: Secondary | ICD-10-CM | POA: Diagnosis not present

## 2018-10-09 ENCOUNTER — Other Ambulatory Visit: Payer: Self-pay | Admitting: Internal Medicine

## 2018-11-13 ENCOUNTER — Telehealth: Payer: Self-pay

## 2018-11-13 NOTE — Telephone Encounter (Signed)
Noted  

## 2018-11-13 NOTE — Telephone Encounter (Signed)
Copied from [phone]. Topic: Appointment Scheduling - Scheduling Inquiry for Clinic >> Nov 13, 2018 11:18 AM Margot Ables wrote: Reason for CRM: pt is scheduled for appt 4/24 and asking if she should move appt out and have medication extended until summer or if she should do phone visit. Pt not sure about webex as she hasn't done before but email and cell # correct. Please advise if pt should reschedule >> Nov 13, 2018 12:21 PM Lonia Skinner [place] A wrote: Would this okay for them to do?

## 2018-11-13 NOTE — Telephone Encounter (Signed)
Appointment has been made for 01/17/19

## 2018-11-30 ENCOUNTER — Other Ambulatory Visit: Payer: Self-pay | Admitting: Internal Medicine

## 2018-12-01 ENCOUNTER — Ambulatory Visit: Payer: Medicare Other | Admitting: Internal Medicine

## 2018-12-04 ENCOUNTER — Other Ambulatory Visit: Payer: Self-pay | Admitting: Internal Medicine

## 2018-12-10 ENCOUNTER — Other Ambulatory Visit: Payer: Self-pay | Admitting: Internal Medicine

## 2018-12-12 ENCOUNTER — Other Ambulatory Visit: Payer: Self-pay | Admitting: Internal Medicine

## 2018-12-19 ENCOUNTER — Other Ambulatory Visit: Payer: Self-pay | Admitting: Internal Medicine

## 2018-12-26 NOTE — Progress Notes (Addendum)
Subjective:   Julia Sanchez is a 73 y.o. female who presents for Medicare Annual (Subsequent) preventive examination.  Review of Systems:  No ROS.  Medicare Wellness Virtual Visit.  Visual/audio telehealth visit, UTA vital signs.   See social history for additional risk factors. I connected with patient by a telephone and verified that I am speaking with the correct person using two identifiers. Patient stated full name and DOB. Patient gave permission to continue with telephonic visit. Patient's location was at home and Nurse's location was at [place] office.   Cardiac Risk Factors include: advanced age (>20men, >73 women);dyslipidemia;hypertension Sleep patterns: gets up 0-1 times nightly to void and sleeps 6-7 hours nightly.    Home Safety/Smoke Alarms: Feels safe in home. Smoke alarms in place.  Living environment; residence and Firearm Safety: 2-story house. Lives with husband, no needs for DME, good support system Seat Belt Safety/Bike Helmet: Wears seat belt.      Objective:     Vitals: There were no vitals taken for this visit.  There is no height or weight on file to calculate BMI.  Advanced Directives 12/27/2018 04/21/2018 12/15/2017 06/27/2014 06/20/2014 02/28/2013  Does Patient Have a Medical Advance Directive? Yes Yes Yes Yes Yes Patient has advance directive, copy not in chart  Type of Advance Directive [place];Living will [place];Living will [place];Living will [place];Living will;Advance instruction for mental health [place] -  Does patient want to make changes to medical advance directive? - No - Patient declined - No - Patient declined No - Patient declined -  Copy of [place] in Chart? No - copy requested No - copy requested No - copy requested No - copy requested No - copy requested -    Tobacco Social History   Tobacco Use   Smoking Status Never Smoker  Smokeless Tobacco Never Used     Counseling given: Not Answered  Past Medical History:  Diagnosis Date  . Anxiety    due to stress - no stress  . DIVERTICULOSIS, COLON 04/07/2009  . HYPERLIPIDEMIA 05/03/2007  . HYPERTENSION 05/03/2007  . SVD (spontaneous vaginal delivery)    x 2   Past Surgical History:  Procedure Laterality Date  . COLONOSCOPY    . CYSTOSCOPY    . EYE SURGERY  05/2012   macular hole - left  . EYE SURGERY  2014   cataract removed left eye  . HYSTEROSCOPY W/D&C  03/06/2013   Procedure: DILATATION AND CURETTAGE /HYSTEROSCOPY;  Surgeon: Margarette Asal, MD;  Location: [facility];  Service: Gynecology;;  . LAPAROSCOPIC ASSISTED VAGINAL HYSTERECTOMY N/A 06/27/2014   Procedure: LAPAROSCOPIC ASSISTED VAGINAL HYSTERECTOMY WITH BILATERAL SALPINGO OOPHORECTOMY;  Surgeon: Margarette Asal, MD;  Location: [facility];  Service: Gynecology;  Laterality: N/A;  . SALPINGOOPHORECTOMY Bilateral 06/27/2014   Procedure: SALPINGO OOPHORECTOMY;  Surgeon: Margarette Asal, MD;  Location: [facility];  Service: Gynecology;  Laterality: Bilateral;  . TONSILLECTOMY    . WISDOM TOOTH EXTRACTION     Family History  Problem Relation Age of Onset  . Coronary artery disease Father        Hx MI  . Heart disease Father        CAD/MI- fatal  . Breast cancer Maternal Aunt   . Diabetes Maternal Uncle   . Diabetes Cousin    Social History   Socioeconomic History  . Marital status: Married    Spouse name:  Not on file  . Number of children: 2  . Years of education: 52  . Highest education level: Not on file  Occupational History  . Occupation: Marine scientist - PACU/ retired    Comment: retired  Scientific laboratory technician  . Financial resource strain: Not hard at all  . Food insecurity:    Worry: Never true    Inability: Never true  . Transportation needs:    Medical: No    Non-medical: No  Tobacco Use  . Smoking status: Never Smoker  . Smokeless tobacco: Never Used  Substance and  Sexual Activity  . Alcohol use: Yes    Comment: occassional  . Drug use: No  . Sexual activity: Yes    Partners: Male    Birth control/protection: None, Post-menopausal  Lifestyle  . Physical activity:    Days per week: 3 days    Minutes per session: 40 min  . Stress: To some extent  Relationships  . Social connections:    Talks on phone: More than three times a week    Gets together: More than three times a week    Attends religious service: More than 4 times per year    Active member of club or organization: Yes    Attends meetings of clubs or organizations: More than 4 times per year    Relationship status: Married  Other Topics Concern  . 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 Marriage is in good health. Lots of family stress.     Outpatient Encounter Medications as of 12/27/2018  Medication Sig  . amitriptyline (ELAVIL) 50 MG tablet Take 1 tablet (50 mg total) by mouth at bedtime.  Marland Kitchen amLODipine (NORVASC) 10 MG tablet TAKE ONE TABLET BY MOUTH DAILY  . aspirin EC 81 MG tablet Take 1 tablet (81 mg total) by mouth daily.  Marland Kitchen atorvastatin (LIPITOR) 20 MG tablet TAKE ONE TABLET BY MOUTH DAILY  . diclofenac sodium (VOLTAREN) 1 % GEL Apply 4 g topically 4 (four) times daily as needed.  . gabapentin (NEURONTIN) 100 MG capsule Take 1 capsule (100 mg total) by mouth 3 (three) times daily. (Patient taking differently: Take 100 mg by mouth at bedtime. )  . ibuprofen (ADVIL,MOTRIN) 800 MG tablet TAKE ONE TABLET BY MOUTH EVERY 8 HOURS AS NEEDED FOR MODERATE PAIN  . potassium chloride (K-DUR) 10 MEQ tablet TAKE ONE TABLET BY MOUTH DAILY  . telmisartan-hydrochlorothiazide (MICARDIS HCT) 80-25 MG tablet TAKE ONE TABLET BY MOUTH DAILY  . [DISCONTINUED] vitamin B-12 (CYANOCOBALAMIN) 100 MCG tablet  Take 100 mcg by mouth daily.   No facility-administered encounter medications on file as of 12/27/2018.     Activities of Daily Living In your present state of health, do you have any difficulty performing the following activities: 12/27/2018  Hearing? N  Vision? N  Difficulty concentrating or making decisions? N  Walking or climbing stairs? N  Dressing or bathing? N  Doing errands, shopping? N  Preparing Food and eating ? N  Using the Toilet? N  In the past six months, have you accidently leaked urine? N  Do you have problems with loss of bowel control? N  Managing your Medications? N  Managing your Finances? N  Housekeeping or managing your Housekeeping? N  Some recent data might be hidden  Patient Care Team: Biagio Borg, MD as PCP - General (Internal Medicine) Molli Posey, MD (Obstetrics and Gynecology) Sable Feil, MD (Gastroenterology) Excell Seltzer, MD (General Surgery) Remer Macho, MD (Ophthalmology)    Assessment:   This is a routine wellness examination for Cherina. Physical assessment deferred to PCP.  Exercise Activities and Dietary recommendations Current Exercise Habits: The patient does not participate in regular exercise at present, Exercise limited by: orthopedic condition(s)  Diet (meal preparation, eat out, water intake, caffeinated beverages, dairy products, fruits and vegetables): in general, a "healthy" diet  , well balanced. eats a variety of fruits and vegetables daily, limits salt, fat/cholesterol, sugar,carbohydrates,caffeine, drinks 6-8 glasses of water daily.  Goals    . Patient Stated     I want to lose 10 pounds by monitoring my diet for carbohydrates, sugar, and fat. I will continue to be active and enjoy life and family.       Fall Risk Fall Risk  12/27/2018 12/15/2017 11/29/2017 11/24/2016 11/25/2015  Falls in the past year? 0 No No No No    Depression Screen PHQ 2/9 Scores 12/27/2018 12/15/2017 11/29/2017 11/24/2016   PHQ - 2 Score 0 0 0 0  PHQ- 9 Score - 0 0 -     Cognitive Function       Ad8 score reviewed for issues:  Issues making decisions: no  Less interest in hobbies / activities: no  Repeats questions, stories (family complaining): no  Trouble using ordinary gadgets (microwave, computer, phone):no  Forgets the month or year: no  Mismanaging finances: no  Remembering appts: no  Daily problems with thinking and/or memory: no Ad8 score is= 0  Immunization History  Administered Date(s) Administered  . Influenza Split 04/23/2011  . Influenza Whole 05/13/2009, 06/09/2012  . Influenza, High Dose Seasonal PF 06/03/2016  . Influenza,inj,Quad PF,6+ Mos 06/28/2014  . Influenza-Unspecified 05/27/2017, 06/29/2018  . Pneumococcal Conjugate-13 10/24/2013  . Pneumococcal Polysaccharide-23 11/20/2008, 01/02/2014  . Td 10/11/2004, 11/19/2014   Screening Tests Health Maintenance  Topic Date Due  . INFLUENZA VACCINE  03/10/2019  . COLONOSCOPY  06/24/2019  . TETANUS/TDAP  11/18/2024  . DEXA SCAN  Completed  . Hepatitis C Screening  Completed  . PNA vac Low Risk Adult  Completed      Plan:      Reviewed health maintenance screenings with patient today and relevant education, vaccines, and/or referrals were provided.   Continue to eat heart healthy diet (full of fruits, vegetables, whole grains, lean protein, water--limit salt, fat, and sugar intake) and increase physical activity as tolerated.  Continue doing brain stimulating activities (puzzles, reading, adult coloring books, staying active) to keep memory sharp.   I have personally reviewed and noted the following in the patient's chart:   . Medical and social history . Use of alcohol, tobacco or illicit drugs  . Current medications and supplements . Functional ability and status . Nutritional status . Physical activity . Advanced directives . List of other physicians . Screenings to include cognitive, depression, and  falls . Referrals and appointments  In addition, I have reviewed and discussed with patient certain preventive protocols, quality metrics, and best practice recommendations. A written personalized care plan for preventive services as well as general preventive health recommendations were provided to patient.     Michiel Cowboy, RN  12/27/2018    Medical screening examination/treatment/procedure(s) were performed by non-physician practitioner and as supervising physician I was immediately available for consultation/collaboration. I agree with above. Cathlean Cower,  MD   

## 2018-12-27 ENCOUNTER — Ambulatory Visit (INDEPENDENT_AMBULATORY_CARE_PROVIDER_SITE_OTHER): Payer: Medicare Other | Admitting: *Deleted

## 2018-12-27 DIAGNOSIS — Z0000 Encounter for general adult medical examination without abnormal findings: Secondary | ICD-10-CM

## 2019-01-09 DIAGNOSIS — M1990 Unspecified osteoarthritis, unspecified site: Secondary | ICD-10-CM | POA: Insufficient documentation

## 2019-01-09 DIAGNOSIS — Z1231 Encounter for screening mammogram for malignant neoplasm of breast: Secondary | ICD-10-CM | POA: Diagnosis not present

## 2019-01-09 DIAGNOSIS — Z779 Other contact with and (suspected) exposures hazardous to health: Secondary | ICD-10-CM | POA: Diagnosis not present

## 2019-01-09 DIAGNOSIS — Z6826 Body mass index (BMI) 26.0-26.9, adult: Secondary | ICD-10-CM | POA: Diagnosis not present

## 2019-01-17 ENCOUNTER — Telehealth: Payer: Self-pay

## 2019-01-17 ENCOUNTER — Other Ambulatory Visit: Payer: Self-pay

## 2019-01-17 ENCOUNTER — Other Ambulatory Visit (INDEPENDENT_AMBULATORY_CARE_PROVIDER_SITE_OTHER): Payer: Medicare Other

## 2019-01-17 ENCOUNTER — Other Ambulatory Visit: Payer: Self-pay | Admitting: Internal Medicine

## 2019-01-17 ENCOUNTER — Encounter: Payer: Self-pay | Admitting: Internal Medicine

## 2019-01-17 ENCOUNTER — Ambulatory Visit (INDEPENDENT_AMBULATORY_CARE_PROVIDER_SITE_OTHER): Payer: Medicare Other | Admitting: Internal Medicine

## 2019-01-17 VITALS — BP 136/82 | HR 85 | Temp 98.3°F | Ht 67.0 in | Wt 165.0 lb

## 2019-01-17 DIAGNOSIS — I10 Essential (primary) hypertension: Secondary | ICD-10-CM | POA: Diagnosis not present

## 2019-01-17 DIAGNOSIS — E611 Iron deficiency: Secondary | ICD-10-CM | POA: Diagnosis not present

## 2019-01-17 DIAGNOSIS — E785 Hyperlipidemia, unspecified: Secondary | ICD-10-CM | POA: Diagnosis not present

## 2019-01-17 DIAGNOSIS — M5412 Radiculopathy, cervical region: Secondary | ICD-10-CM

## 2019-01-17 DIAGNOSIS — E559 Vitamin D deficiency, unspecified: Secondary | ICD-10-CM | POA: Diagnosis not present

## 2019-01-17 DIAGNOSIS — E538 Deficiency of other specified B group vitamins: Secondary | ICD-10-CM

## 2019-01-17 DIAGNOSIS — M19049 Primary osteoarthritis, unspecified hand: Secondary | ICD-10-CM | POA: Diagnosis not present

## 2019-01-17 DIAGNOSIS — I350 Nonrheumatic aortic (valve) stenosis: Secondary | ICD-10-CM

## 2019-01-17 DIAGNOSIS — R739 Hyperglycemia, unspecified: Secondary | ICD-10-CM

## 2019-01-17 DIAGNOSIS — G5603 Carpal tunnel syndrome, bilateral upper limbs: Secondary | ICD-10-CM

## 2019-01-17 LAB — BASIC METABOLIC PANEL
BUN: 13 mg/dL (ref 6–23)
CO2: 27 mEq/L (ref 19–32)
Calcium: 9.7 mg/dL (ref 8.4–10.5)
Chloride: 98 mEq/L (ref 96–112)
Creatinine, Ser: 0.71 mg/dL (ref 0.40–1.20)
GFR: 80.22 mL/min (ref >60.00)
Glucose, Bld: 110 mg/dL — ABNORMAL HIGH (ref 70–99)
Potassium: 3.6 mEq/L (ref 3.5–5.1)
Sodium: 135 mEq/L (ref 135–145)

## 2019-01-17 LAB — CBC WITH DIFFERENTIAL/PLATELET
Basophils Absolute: 0 10*3/uL (ref 0.0–0.1)
Basophils Relative: 0.7 % (ref 0.0–3.0)
Eosinophils Absolute: 0.3 10*3/uL (ref 0.0–0.7)
Eosinophils Relative: 4.5 % (ref 0.0–5.0)
HCT: 37.4 % (ref 36.0–46.0)
Hemoglobin: 12.9 g/dL (ref 12.0–15.0)
Lymphocytes Relative: 19 % (ref 12.0–46.0)
Lymphs Abs: 1.2 10*3/uL (ref 0.7–4.0)
MCHC: 34.4 g/dL (ref 30.0–36.0)
MCV: 88.3 fl (ref 78.0–100.0)
Monocytes Absolute: 0.6 10*3/uL (ref 0.1–1.0)
Monocytes Relative: 8.5 % (ref 3.0–12.0)
Neutro Abs: 4.3 10*3/uL (ref 1.4–7.7)
Neutrophils Relative %: 67.3 % (ref 43.0–77.0)
Platelets: 362 10*3/uL (ref 150.0–400.0)
RBC: 4.24 Mil/uL (ref 3.87–5.11)
RDW: 12.8 % (ref 11.5–15.5)
WBC: 6.5 10*3/uL (ref 4.0–10.5)

## 2019-01-17 LAB — HEPATIC FUNCTION PANEL
ALT: 15 U/L (ref 0–35)
AST: 13 U/L (ref 0–37)
Albumin: 4.6 g/dL (ref 3.5–5.2)
Alkaline Phosphatase: 100 U/L (ref 39–117)
Bilirubin, Direct: 0.1 mg/dL (ref 0.0–0.3)
Total Bilirubin: 0.6 mg/dL (ref 0.2–1.2)
Total Protein: 7.3 g/dL (ref 6.0–8.3)

## 2019-01-17 LAB — URINALYSIS, ROUTINE W REFLEX MICROSCOPIC
Bilirubin Urine: NEGATIVE
Hgb urine dipstick: NEGATIVE
Ketones, ur: NEGATIVE
Nitrite: NEGATIVE
RBC / HPF: NONE SEEN (ref 0–?)
Specific Gravity, Urine: 1.01 (ref 1.000–1.030)
Total Protein, Urine: NEGATIVE
Urine Glucose: NEGATIVE
Urobilinogen, UA: 0.2 (ref 0.0–1.0)
pH: 7 (ref 5.0–8.0)

## 2019-01-17 LAB — LIPID PANEL
Cholesterol: 160 mg/dL (ref 0–200)
HDL: 62.1 mg/dL (ref >39.00)
LDL Cholesterol: 80 mg/dL (ref 0–99)
NonHDL: 98.1
Total CHOL/HDL Ratio: 3
Triglycerides: 91 mg/dL (ref 0.0–149.0)
VLDL: 18.2 mg/dL (ref 0.0–40.0)

## 2019-01-17 LAB — IBC PANEL
Iron: 97 ug/dL (ref 42–145)
Saturation Ratios: 27.7 % (ref 20.0–50.0)
Transferrin: 250 mg/dL (ref 212.0–360.0)

## 2019-01-17 LAB — TSH: TSH: 2.08 u[IU]/mL (ref 0.35–4.50)

## 2019-01-17 LAB — VITAMIN B12: Vitamin B-12: 352 pg/mL (ref 211–911)

## 2019-01-17 LAB — VITAMIN D 25 HYDROXY (VIT D DEFICIENCY, FRACTURES): VITD: 20.02 ng/mL — ABNORMAL LOW (ref 30.00–100.00)

## 2019-01-17 LAB — HEMOGLOBIN A1C: Hgb A1c MFr Bld: 6.1 % (ref 4.6–6.5)

## 2019-01-17 MED ORDER — VITAMIN D (ERGOCALCIFEROL) 1.25 MG (50000 UNIT) PO CAPS: 50000.0000 [IU] | ORAL_CAPSULE | ORAL | 0 refills | Status: DC

## 2019-01-17 MED ORDER — GABAPENTIN 100 MG PO CAPS: 100.0000 mg | ORAL_CAPSULE | Freq: Three times a day (TID) | ORAL | 3 refills | Status: DC

## 2019-01-17 MED ORDER — AMITRIPTYLINE HCL 50 MG PO TABS: 50.0000 mg | ORAL_TABLET | Freq: Every day | ORAL | 3 refills | Status: DC

## 2019-01-17 MED ORDER — AMLODIPINE BESYLATE 10 MG PO TABS: 10.0000 mg | ORAL_TABLET | Freq: Every day | ORAL | 3 refills | Status: DC

## 2019-01-17 MED ORDER — POTASSIUM CHLORIDE ER 10 MEQ PO TBCR: EXTENDED_RELEASE_TABLET | ORAL | 3 refills | Status: DC

## 2019-01-17 MED ORDER — IBUPROFEN 800 MG PO TABS: ORAL_TABLET | ORAL | 2 refills | Status: DC

## 2019-01-17 MED ORDER — ATORVASTATIN CALCIUM 20 MG PO TABS: 20.0000 mg | ORAL_TABLET | Freq: Every day | ORAL | 3 refills | Status: DC

## 2019-01-17 MED ORDER — TELMISARTAN-HCTZ 80-25 MG PO TABS: 1.0000 | ORAL_TABLET | Freq: Every day | ORAL | 3 refills | Status: DC

## 2019-01-17 MED ORDER — GABAPENTIN 100 MG PO CAPS: 100.0000 mg | ORAL_CAPSULE | Freq: Three times a day (TID) | ORAL | 1 refills | Status: DC

## 2019-01-17 MED ORDER — DICLOFENAC SODIUM 1 % TD GEL: 4.0000 g | Freq: Four times a day (QID) | TRANSDERMAL | 5 refills | Status: DC | PRN

## 2019-01-17 NOTE — Telephone Encounter (Signed)
Pt has been informed of results and expressed understanding.  °

## 2019-01-17 NOTE — Assessment & Plan Note (Signed)
stable overall by history and exam, recent data reviewed with pt, and pt to continue medical treatment as before,  to f/u any worsening symptoms or concerns  

## 2019-01-17 NOTE — Assessment & Plan Note (Addendum)
stable overall by history and exam, recent data reviewed with pt, and pt to continue medical treatment as before,  to f/u any worsening symptoms or concerns  Note:  Total time for pt hx, exam, review of record with pt in the room, determination of diagnoses and plan for further eval and tx is > 40 min, with over 50% spent in coordination and counseling of patient including the differential dx, tx, further evaluation and other management of HTN, HLD, hyperglycemia, aortic stenosis, and bilateral cervical radiculopathy

## 2019-01-17 NOTE — Telephone Encounter (Signed)
-----   Message from Biagio Borg, MD sent at 01/17/2019 12:01 PM EDT ----- Left message on MyChart, pt to cont same tx except  Please take Vitamin D 50000 units weekly for 12 weeks, then plan to change to OTC Vitamin D3 at 2000 units per day, indefinitely.  Shirron to please inform pt, I will do rx

## 2019-01-17 NOTE — Progress Notes (Signed)
Subjective:    Patient ID: Julia Sanchez, female    DOB: [birth year], 73 y.o.   MRN: [id number]  HPI  Here for yearly f/u;  Overall doing ok;  Pt denies Chest pain, worsening SOB, DOE, wheezing, orthopnea, PND, worsening LE edema, palpitations, dizziness or syncope.  Pt denies neurological change such as new headache, facial or extremity weakness.  Pt denies polydipsia, polyuria, or low sugar symptoms. Pt states overall good compliance with treatment and medications, good tolerability, and has been trying to follow appropriate diet.  Pt denies worsening depressive symptoms, suicidal ideation or panic. No fever, night sweats, wt loss, loss of appetite, or other constitutional symptoms.  Pt states good ability with ADL's, has low fall risk, home safety reviewed and adequate, no other significant changes in hearing or vision, and only occasionally active with exercise.  No new complaints. Pt denies new neurological symptoms such as new headache, or facial or extremity weakness or numbness Past Medical History:  Diagnosis Date  . Anxiety    due to stress - no stress  . DIVERTICULOSIS, COLON 04/07/2009  . HYPERLIPIDEMIA 05/03/2007  . HYPERTENSION 05/03/2007  . SVD (spontaneous vaginal delivery)    x 2   Past Surgical History:  Procedure Laterality Date  . COLONOSCOPY    . CYSTOSCOPY    . EYE SURGERY  05/2012   macular hole - left  . EYE SURGERY  2014   cataract removed left eye  . HYSTEROSCOPY W/D&C  03/06/2013   Procedure: DILATATION AND CURETTAGE /HYSTEROSCOPY;  Surgeon: Margarette Asal, MD;  Location: [facility];  Service: Gynecology;;  . LAPAROSCOPIC ASSISTED VAGINAL HYSTERECTOMY N/A 06/27/2014   Procedure: LAPAROSCOPIC ASSISTED VAGINAL HYSTERECTOMY WITH BILATERAL SALPINGO OOPHORECTOMY;  Surgeon: Margarette Asal, MD;  Location: [facility];  Service: Gynecology;  Laterality: N/A;  . SALPINGOOPHORECTOMY Bilateral 06/27/2014   Procedure: SALPINGO OOPHORECTOMY;  Surgeon: Margarette Asal, MD;   Location: [facility];  Service: Gynecology;  Laterality: Bilateral;  . TONSILLECTOMY    . WISDOM TOOTH EXTRACTION      reports that she has never smoked. She has never used smokeless tobacco. She reports current alcohol use. She reports that she does not use drugs. family history includes Breast cancer in her maternal aunt; Coronary artery disease in her father; Diabetes in her cousin and maternal uncle; Heart disease in her father. Allergies  Allergen Reactions  . Nitrofurantoin Nausea And Vomiting  . Sulfonamide Derivatives Nausea And Vomiting   Current Outpatient Medications on File Prior to Visit  Medication Sig Dispense Refill  . aspirin EC 81 MG tablet Take 1 tablet (81 mg total) by mouth daily. 90 tablet 11   No current facility-administered medications on file prior to visit.    Review of Systems  Constitutional: Negative for other unusual diaphoresis or sweats HENT: Negative for ear discharge or swelling Eyes: Negative for other worsening visual disturbances Respiratory: Negative for stridor or other swelling  Gastrointestinal: Negative for worsening distension or other blood Genitourinary: Negative for retention or other urinary change Musculoskeletal: Negative for other MSK pain or swelling Skin: Negative for color change or other new lesions Neurological: Negative for worsening tremors and other numbness  Psychiatric/Behavioral: Negative for worsening agitation or other fatigue All other system neg per pt    Objective:   Physical Exam BP 136/82   Pulse 85   Temp 98.3 F (36.8 C) (Oral)   Ht 5\' 7"  (1.702 m)   Wt 165 lb (74.8 kg)   SpO2 98%  BMI 25.84 kg/m  VS noted,  Constitutional: Pt is oriented to person, place, and time. Appears well-developed and well-nourished, in no significant distress and comfortable Head: Normocephalic and atraumatic  Eyes: Conjunctivae and EOM are normal. Pupils are equal, round, and reactive to light Right Ear: External ear normal  without discharge Left Ear: External ear normal without discharge Nose: Nose without discharge or deformity Mouth/Throat: Oropharynx is without other ulcerations and moist  Neck: Normal range of motion. Neck supple. No JVD present. No tracheal deviation present or significant neck LA or mass Cardiovascular: Normal rate, regular rhythm, normal heart sounds and intact distal pulses.   Pulmonary/Chest: WOB normal and breath sounds without rales or wheezing  Abdominal: Soft. Bowel sounds are normal. NT. No HSM  Musculoskeletal: Normal range of motion. Exhibits no edema but has marked diffuse DIP and PIP arthritic changes Lymphadenopathy: Has no other cervical adenopathy.  Neurological: Pt is alert and oriented to person, place, and time. Pt has normal reflexes. No cranial nerve deficit. Motor grossly intact, Gait intact Skin: Skin is warm and dry. No rash noted or new ulcerations Psychiatric:  Has normal mood and affect. Behavior is normal without agitation No other exam findings Lab Results  Component Value Date   WBC 6.5 01/17/2019   HGB 12.9 01/17/2019   HCT 37.4 01/17/2019   PLT 362.0 01/17/2019   GLUCOSE 110 (H) 01/17/2019   CHOL 160 01/17/2019   TRIG 91.0 01/17/2019   HDL 62.10 01/17/2019   LDLDIRECT 128.0 02/17/2010   LDLCALC 80 01/17/2019   ALT 15 01/17/2019   AST 13 01/17/2019   NA 135 01/17/2019   K 3.6 01/17/2019   CL 98 01/17/2019   CREATININE 0.71 01/17/2019   BUN 13 01/17/2019   CO2 27 01/17/2019   TSH 2.08 01/17/2019   HGBA1C 6.1 01/17/2019          Assessment & Plan:

## 2019-01-17 NOTE — Patient Instructions (Signed)
Please continue all other medications as before, and refills have been done if requested.  Please have the pharmacy call with any other refills you may need.  Please continue your efforts at being more active, low cholesterol diet, and weight control.  You are otherwise up to date with prevention measures today.  Please keep your appointments with your specialists as you may have planned  Please go to the LAB in the Basement (turn left off the elevator) for the tests to be done today  You will be contacted by phone if any changes need to be made immediately.  Otherwise, you will receive a letter about your results with an explanation, but please check with MyChart first.  Please remember to sign up for MyChart if you have not done so, as this will be important to you in the future with finding out test results, communicating by private email, and scheduling acute appointments online when needed.  Please return in 1 year for your yearly visit, or sooner if needed,

## 2019-01-17 NOTE — Assessment & Plan Note (Signed)
With mild annoying numbness at times and mild loss of dexterity later in the day; declines MRI for now as seems chronic stable

## 2019-01-22 ENCOUNTER — Telehealth: Payer: Self-pay | Admitting: *Deleted

## 2019-01-22 NOTE — Telephone Encounter (Signed)
Called and left the patient a message to call the office. Patient needs to be scheduled for a new patient appt  

## 2019-01-22 NOTE — Telephone Encounter (Signed)
Patient called back and was scheduled for a new patient appt

## 2019-01-25 DIAGNOSIS — M25561 Pain in right knee: Secondary | ICD-10-CM | POA: Insufficient documentation

## 2019-01-25 DIAGNOSIS — M1711 Unilateral primary osteoarthritis, right knee: Secondary | ICD-10-CM | POA: Insufficient documentation

## 2019-01-26 DIAGNOSIS — M25561 Pain in right knee: Secondary | ICD-10-CM | POA: Diagnosis not present

## 2019-01-29 ENCOUNTER — Other Ambulatory Visit: Payer: Self-pay

## 2019-01-29 ENCOUNTER — Encounter: Payer: Self-pay | Admitting: Gynecologic Oncology

## 2019-01-29 ENCOUNTER — Inpatient Hospital Stay: Payer: Medicare Other | Attending: Gynecologic Oncology | Admitting: Gynecologic Oncology

## 2019-01-29 VITALS — BP 152/64 | HR 92 | Temp 97.8°F | Resp 18 | Ht 67.0 in | Wt 167.3 lb

## 2019-01-29 DIAGNOSIS — Z7982 Long term (current) use of aspirin: Secondary | ICD-10-CM | POA: Diagnosis not present

## 2019-01-29 DIAGNOSIS — E7800 Pure hypercholesterolemia, unspecified: Secondary | ICD-10-CM | POA: Diagnosis not present

## 2019-01-29 DIAGNOSIS — Z791 Long term (current) use of non-steroidal anti-inflammatories (NSAID): Secondary | ICD-10-CM | POA: Insufficient documentation

## 2019-01-29 DIAGNOSIS — R87613 High grade squamous intraepithelial lesion on cytologic smear of cervix (HGSIL): Secondary | ICD-10-CM | POA: Diagnosis not present

## 2019-01-29 DIAGNOSIS — Z90710 Acquired absence of both cervix and uterus: Secondary | ICD-10-CM

## 2019-01-29 DIAGNOSIS — I10 Essential (primary) hypertension: Secondary | ICD-10-CM | POA: Diagnosis not present

## 2019-01-29 DIAGNOSIS — Z79899 Other long term (current) drug therapy: Secondary | ICD-10-CM | POA: Insufficient documentation

## 2019-01-29 DIAGNOSIS — Z90722 Acquired absence of ovaries, bilateral: Secondary | ICD-10-CM | POA: Diagnosis not present

## 2019-01-29 DIAGNOSIS — R87619 Unspecified abnormal cytological findings in specimens from cervix uteri: Secondary | ICD-10-CM | POA: Insufficient documentation

## 2019-01-29 DIAGNOSIS — N891 Moderate vaginal dysplasia: Secondary | ICD-10-CM | POA: Diagnosis not present

## 2019-01-29 NOTE — Progress Notes (Signed)
Consult Note: Gyn-Onc  Consult was requested by Dr. Matthew Saras for the evaluation of Julia Sanchez 73 y.o. female  CC:  Chief Complaint  Patient presents with  . HGSIL (high grade squamous intraepithelial lesion) on Pap sm    Assessment/Plan:  Julia Sanchez  is a 73 y.o.  year old with HGSIL pap of the vagina.   On today's colposcopy there was no gross evidence of invasive carcinoma.  I favor either vaginal dysplasia or atrophic changes (given her negative high-risk HPV status).  We will follow-up the results of today's biopsy and have facilitated a WebEx consultation to discuss the results and a plan for proceeding.  If dysplasia is identified on the biopsies I am recommending a trial of 2 months of 5-FU with vaginal estrogen.  Alternatively if no dysplasia is identified on the colposcopic directed biopsies I am recommending vaginal estrogen to improve the integrity of the vaginal mucosa.   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   The patient has never had high risk HPV diagnosed on Pap but has a remote history of multiple low-grade abnormal Paps which culminated in a colposcopic evaluation with biopsies in 2014 that showed benign squamous mucosa followed by an LAVH BSO procedure on June 27, 2014 which also revealed no dysplastic changes in the cervix (and benign endometrium fallopian tubes and ovaries).  In March 2019 Pap of the vagina revealed low-grade squamous intraepithelial lesion.  The preceding Pap in January 2018 revealed atypical squamous cells of undetermined significance.  In May 2017 they had revealed LGSIL, vain 1.  A similar finding was present in January 2017 and September 2016.  Pap performed on January 09, 2019 revealed high-grade squamous intraepithelial lesion, vain 2/vain 3 with no high  risk HPV.  The patient is not currently sexually active as her husband has GU malignancy.  She is otherwise a very healthy woman who takes hypertensive hypercholesterolemic medications.  She takes a baby aspirin daily for heart health.  Her only prior abdominal surgery was her LAVH.  She has had 2 prior vaginal deliveries.  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  She is a never smoker.    Current Meds:  Outpatient Encounter Medications as of 01/29/2019  Medication Sig  . amitriptyline (ELAVIL) 50 MG tablet Take 1 tablet (50 mg total) by mouth at bedtime.  Marland Kitchen amLODipine (NORVASC) 10 MG tablet Take 1 tablet (10 mg total) by mouth daily.  Marland Kitchen aspirin EC 81 MG tablet Take 1 tablet (81 mg total) by mouth daily.  Marland Kitchen atorvastatin (LIPITOR) 20 MG tablet Take 1 tablet (20 mg total) by mouth daily.  . diclofenac sodium (VOLTAREN) 1 % GEL Apply 4 g topically 4 (four) times daily as needed.  . gabapentin (NEURONTIN) 100 MG capsule Take 1 capsule (100 mg total) by mouth 3 (three) times daily.  Marland Kitchen ibuprofen (ADVIL) 800 MG tablet TAKE ONE TABLET BY MOUTH EVERY 8 HOURS AS NEEDED FOR MODERATE PAIN  . potassium chloride (K-DUR) 10 MEQ tablet TAKE ONE TABLET BY MOUTH DAILY  . telmisartan-hydrochlorothiazide (MICARDIS HCT) 80-25 MG tablet Take 1 tablet by mouth daily.  . Vitamin D, Ergocalciferol, (DRISDOL) 1.25 MG (50000 UT) CAPS capsule Take 1 capsule (50,000 Units total) by mouth every 7 (  seven) days.   No facility-administered encounter medications on file as of 01/29/2019.     Allergy:  Allergies  Allergen Reactions  . Nitrofurantoin Nausea And Vomiting  . Sulfonamide Derivatives Nausea And Vomiting    Social Hx:   Social History   Socioeconomic History  . Marital status: Married    Spouse name: Not on file  . Number of children: 2  . Years of education: 68  . Highest education level: Not on file  Occupational History  . 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 Financial resource strain: Not hard at all  . Food insecurity    Worry: Never true    Inability: Never true  . Transportation needs    Medical: No    Non-medical: No  Tobacco Use  . Smoking status: Never Smoker  . Smokeless tobacco: Never Used  Substance and Sexual Activity  . Alcohol use: Yes    Comment: occassional  . Drug use: No  . Sexual activity: Yes    Partners: Male    Birth control/protection: None, Post-menopausal  Lifestyle  . Physical activity    Days per week: 3 days    Minutes per session: 40 min  . Stress: To some extent  Relationships  . Social connections    Talks on phone: More than three times a week    Gets together: More than three times a week    Attends religious service: More than 4 times per year    Active member of club or organization: Yes    Attends meetings of clubs or organizations: More than 4 times per year    Relationship status: Married  . Intimate partner violence    Fear of current or ex partner: No    Emotionally abused: No    Physically abused: No    Forced sexual activity: No  Other Topics Concern  . 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 Marriage is in good health. Lots of family stress.     Past Surgical Hx:  Past Surgical History:  Procedure Laterality Date  . COLONOSCOPY    . CYSTOSCOPY    . EYE SURGERY  05/2012   macular hole - left  . EYE SURGERY  2014   cataract removed left eye  . HYSTEROSCOPY W/D&C  03/06/2013   Procedure: DILATATION AND CURETTAGE /HYSTEROSCOPY;  Surgeon: Margarette Asal, MD;  Location: [facility];  Service: Gynecology;;  . LAPAROSCOPIC ASSISTED VAGINAL HYSTERECTOMY N/A 06/27/2014   Procedure: LAPAROSCOPIC ASSISTED VAGINAL HYSTERECTOMY WITH BILATERAL SALPINGO  OOPHORECTOMY;  Surgeon: Margarette Asal, MD;  Location: [facility];  Service: Gynecology;  Laterality: N/A;  . SALPINGOOPHORECTOMY Bilateral 06/27/2014   Procedure: SALPINGO OOPHORECTOMY;  Surgeon: Margarette Asal, MD;  Location: [facility];  Service: Gynecology;  Laterality: Bilateral;  . TONSILLECTOMY    . WISDOM TOOTH EXTRACTION      Past Medical Hx:  Past Medical History:  Diagnosis Date  . Anxiety    due to stress - no stress  . DIVERTICULOSIS, COLON 04/07/2009  . HYPERLIPIDEMIA 05/03/2007  . HYPERTENSION 05/03/2007  . SVD (spontaneous vaginal delivery)    x 2    Past Gynecological History:  svd x 2 No LMP recorded. Patient is postmenopausal.  Family Hx:  Family History  Problem Relation Age of Onset  . Coronary artery disease Father        Hx MI  . Heart disease Father        CAD/MI- fatal  . Breast cancer Maternal Aunt   . Diabetes Maternal Uncle   . Diabetes Cousin     Review of Systems:  Constitutional  Feels well,   ENT Normal appearing ears and nares bilaterally Skin/Breast  No rash, sores, jaundice, itching, dryness Cardiovascular  No chest pain, shortness of breath, or edema  Pulmonary  No cough or wheeze.  Gastro Intestinal  No nausea, vomitting, or diarrhoea. No bright red blood per rectum, no abdominal pain, change in bowel movement, or constipation.  Genito Urinary  No frequency, urgency, dysuria, no bleeding or abnormal discharge Musculo Skeletal  No myalgia, arthralgia, joint swelling or pain  Neurologic  No weakness, numbness, change in gait,  Psychology  No depression, anxiety, insomnia.   Vitals:  Blood pressure (!) 152/64, pulse 92, temperature 97.8 F (36.6 C), temperature source Oral, resp. rate 18, height 5\' 7"  (1.702 m), weight 167 lb 4.8 oz (75.9 kg), SpO2 100 %.  Physical Exam: WD in NAD Neck  Supple NROM, without any enlargements.  Lymph Node Survey No cervical supraclavicular or inguinal adenopathy Cardiovascular  Pulse normal  rate, regularity and rhythm. S1 and S2 normal.  Lungs  Clear to auscultation bilateraly, without wheezes/crackles/rhonchi. Good air movement.  Skin  No rash/lesions/breakdown  Psychiatry  Alert and oriented to person, place, and time  Abdomen  Normoactive bowel sounds, abdomen soft, non-tender and thin without evidence of hernia.  Back No CVA tenderness Genito Urinary  Vulva/vagina: Normal external female genitalia.  No lesions. No discharge or bleeding.  Bladder/urethra:  No lesions or masses, well supported bladder  Vagina: grossly normal (see colposcopy note below). No palpable lesions.  Cervix and uterus surgically absent.  Adnexa: no palpable masses. Rectal  Deferred. Extremities  No bilateral cyanosis, clubbing or edema.  Procedure Note:  Preop Dx: HGSIL vaginal pap Postop Dx: same Procedure: vaginal colposcopy with biopsies Surgeon: Dorann Ou, MD EBL: scant Specimens: 1/ posterior left vaginal cuff, 2/ anterior right vaginal cuff Complications: none Procedure Details: The patient provided verbal consent and procedure was described.  A verbal timeout was performed.  The speculum was inserted into the vagina and the entirety of the vagina was visualized.  It was prepped generously with 4% acetic acid.  The colposcope was used to magnify the view of the vagina and vaginal cuff and vaginal sidewalls.  The speculum was rotated to ensure visualization of the anterior and posterior walls of the vagina.  There was a small area of hairpin vascularity at the posterior left vaginal cuff at 4:00 at the cuff.  There was some mild erythematous changes at the anterior right vaginal cuff at 10:00.  Both of these areas were biopsied as representative samples.  Silver nitrate was used to create hemostasis at these biopsy sites.  The patient tolerated the procedure well.  Specimens were sent for histopathology.   Thereasa Solo, MD  01/29/2019, 4:22 PM

## 2019-01-29 NOTE — Patient Instructions (Signed)
Dr Denman George performed a colposcopy with vaginal biopsies today. She did not see any obvious cancer but identified a couple of areas at the "cuff" that looked like either atrophic (thin) tissue or dysplasia. She biopsied these. The results should be back in a week at which time she will contact you with the results and the plan moving forward (it will likely be a trial of topical estrogen or topical chemotherapy to the vagin).   Her office can be reached at [phone] with questions.  It is normal to have spotting after the procedure today.

## 2019-02-05 ENCOUNTER — Telehealth: Payer: Self-pay | Admitting: Gynecologic Oncology

## 2019-02-05 NOTE — Telephone Encounter (Signed)
Contacted patient to verify telephone visit for pre reg °

## 2019-02-06 ENCOUNTER — Inpatient Hospital Stay (HOSPITAL_BASED_OUTPATIENT_CLINIC_OR_DEPARTMENT_OTHER): Payer: Medicare Other | Admitting: Gynecologic Oncology

## 2019-02-06 ENCOUNTER — Encounter: Payer: Self-pay | Admitting: Gynecologic Oncology

## 2019-02-06 DIAGNOSIS — D072 Carcinoma in situ of vagina: Secondary | ICD-10-CM | POA: Insufficient documentation

## 2019-02-06 MED ORDER — FLUOROURACIL 5 % EX CREA: TOPICAL_CREAM | CUTANEOUS | 0 refills | Status: DC

## 2019-02-06 MED ORDER — PREMARIN 0.625 MG/GM VA CREA: 1.0000 | TOPICAL_CREAM | VAGINAL | 12 refills | Status: DC

## 2019-02-06 NOTE — Progress Notes (Signed)
Gynecologic Oncology Telehealth Consult Note: Gyn-Onc  I connected with Julia Sanchez on 02/06/19 at  4:00 PM EDT by telephone and verified that I am speaking with the correct person using two identifiers.  I discussed the limitations, risks, security and privacy concerns of performing an evaluation and management service by telemedicine and the availability of in-person appointments. I also discussed with the patient that there may be a patient responsible charge related to this service. The patient expressed understanding and agreed to proceed.  Other persons participating in the visit and their role in the encounter: none.  Patient's location: Home Provider's location: [place]  Chief Complaint:  Chief Complaint  Patient presents with  . VAIN    discussion regarding treatment   I discussed with the patient the findings of VAIN III on biopsy at the anterior right vagina. Recommendation is for Efudex 2gm once a week time 8 weeks. I discussed how to apply this, protect the surrounding skin with a barrier ointment and wash off first thing in the morning.  I also recommend use of premarin three times a week in the days in between application of Efudex.   Patient will be sent a copy of this protocol.  Prescriptions e prescribed for both.  I discussed that I will see her back in 3 months for repeat pap.  All questions answered.  I discussed the assessment and treatment plan with the patient. The patient was provided with an opportunity to ask questions and all were answered. The patient agreed with the plan and demonstrated an understanding of the instructions.   The patient was advised to call back or see an in-person evaluation if the symptoms worsen or if the condition fails to improve as anticipated.   I provided 10 minutes of non face-to-face telephone visit time during this encounter, and > 50% was spent counseling as documented under my assessment &  plan.  Thereasa Solo, MD

## 2019-02-07 ENCOUNTER — Telehealth: Payer: Self-pay

## 2019-02-07 ENCOUNTER — Encounter: Payer: Self-pay | Admitting: Gynecologic Oncology

## 2019-02-07 ENCOUNTER — Other Ambulatory Visit: Payer: Self-pay | Admitting: Gynecologic Oncology

## 2019-02-07 DIAGNOSIS — D072 Carcinoma in situ of vagina: Secondary | ICD-10-CM

## 2019-02-07 MED ORDER — FLUOROURACIL 5 % EX CREA: TOPICAL_CREAM | CUTANEOUS | 0 refills | Status: DC

## 2019-02-07 MED FILL — FLUOROURACIL 5 % CREA: 5 | 56 days supply | Qty: 40 | Fill #0

## 2019-02-07 NOTE — Patient Instructions (Signed)
Recommendation is for Efudex 2gm once a week for 8 weeks.  Protect the surrounding skin with a barrier ointment and wash off first thing in the morning.  Use premarin three times a week in the days in between application of Efudex.  See attached instruction sheet.

## 2019-02-07 NOTE — Telephone Encounter (Signed)
Media Information    Document Information  Photos  Efudex instructions  02/07/2019 12:29  Attached To:  Arroyo D, NP  [facility]   Reviewed the above instructions with Julia Sanchez regarding the Effudex application  Prescribed by Dr. Denman George. Will mail a copy to patient's home. Pt states that she has a good understanding of application. She knows to call the office at [phone] if she has any questions or concerns. F/U appointment set up for Friday 04-27-19 for a follow up pap smear.

## 2019-02-07 NOTE — Telephone Encounter (Signed)
Patient cost is ~$38.65 for medication. Prescription sent to [facility] patient pharmacy. Pt to p/u after 1200 on 02-08-19. Pt bought a Monistat cream box to use the applicator with the efudex.

## 2019-02-08 ENCOUNTER — Telehealth: Payer: Self-pay | Admitting: *Deleted

## 2019-02-08 DIAGNOSIS — Z20822 Contact with and (suspected) exposure to covid-19: Secondary | ICD-10-CM

## 2019-02-08 NOTE — Telephone Encounter (Signed)
Pt scheduled for covid testing 02/12/19 @ 9:00 @ [facility]. Instructions given and order placed

## 2019-02-08 NOTE — Telephone Encounter (Signed)
-----   Message from Biagio Borg, MD sent at 02/08/2019  2:37 PM EDT ----- Regarding: covid testing Plesae to contact pt regarding testing  Husband is COVID + and she has loss of taste and smell

## 2019-02-12 ENCOUNTER — Other Ambulatory Visit: Payer: Self-pay

## 2019-02-12 DIAGNOSIS — R6889 Other general symptoms and signs: Secondary | ICD-10-CM | POA: Diagnosis not present

## 2019-02-12 DIAGNOSIS — Z20822 Contact with and (suspected) exposure to covid-19: Secondary | ICD-10-CM

## 2019-02-17 LAB — NOVEL CORONAVIRUS, NAA: SARS-CoV-2, NAA: DETECTED — AB

## 2019-04-27 ENCOUNTER — Inpatient Hospital Stay: Payer: Medicare Other | Attending: Gynecologic Oncology | Admitting: Gynecologic Oncology

## 2019-04-27 ENCOUNTER — Encounter: Payer: Self-pay | Admitting: Gynecologic Oncology

## 2019-04-27 ENCOUNTER — Other Ambulatory Visit (HOSPITAL_COMMUNITY)
Admission: RE | Admit: 2019-04-27 | Discharge: 2019-04-27 | Disposition: A | Discharge status: Home / Self Care | Payer: Medicare Other | Source: Ambulatory Visit | Attending: Gynecologic Oncology | Admitting: Gynecologic Oncology

## 2019-04-27 ENCOUNTER — Other Ambulatory Visit: Payer: Self-pay

## 2019-04-27 VITALS — BP 137/64 | HR 79 | Temp 98.5°F | Ht 66.5 in | Wt 162.2 lb

## 2019-04-27 DIAGNOSIS — Z79899 Other long term (current) drug therapy: Secondary | ICD-10-CM | POA: Insufficient documentation

## 2019-04-27 DIAGNOSIS — Z90710 Acquired absence of both cervix and uterus: Secondary | ICD-10-CM

## 2019-04-27 DIAGNOSIS — Z1151 Encounter for screening for human papillomavirus (HPV): Secondary | ICD-10-CM | POA: Diagnosis not present

## 2019-04-27 DIAGNOSIS — I10 Essential (primary) hypertension: Secondary | ICD-10-CM | POA: Insufficient documentation

## 2019-04-27 DIAGNOSIS — D072 Carcinoma in situ of vagina: Secondary | ICD-10-CM

## 2019-04-27 DIAGNOSIS — Z7982 Long term (current) use of aspirin: Secondary | ICD-10-CM | POA: Diagnosis not present

## 2019-04-27 DIAGNOSIS — F419 Anxiety disorder, unspecified: Secondary | ICD-10-CM | POA: Diagnosis not present

## 2019-04-27 DIAGNOSIS — Z90722 Acquired absence of ovaries, bilateral: Secondary | ICD-10-CM

## 2019-04-27 DIAGNOSIS — E7800 Pure hypercholesterolemia, unspecified: Secondary | ICD-10-CM | POA: Diagnosis not present

## 2019-04-27 NOTE — Patient Instructions (Signed)
Dr Denman George will contact you with the results of your pap test. If abnormal, she will talk to you about either colposcopy and biopsies, or proceeding with surgical evaluation.

## 2019-04-27 NOTE — Progress Notes (Signed)
Follow-up Note: Gyn-Onc  Consult was requested by Dr. Matthew Saras for the evaluation of DEZIRAE Sanchez 73 y.o. female  CC:  Chief Complaint  Patient presents with  . VAIN    follow-up s/p effudex    Assessment/Plan:  Ms. Julia Sanchez  is a 73 y.o.  year old with VAIN III of the vagina,  S/p efudex therapy completed August, 2020.  We will follow-up today's Pap. 1/ if normal - recommend return to Dr Matthew Saras for annual surveillance.  2/ if low grade cytology - recommend colpo and biopsies 3/ if high grade cytology - recommend either vaginal laser or upper vaginectomy.   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   The patient has never had high risk HPV diagnosed on Pap but has a remote history of multiple low-grade abnormal Paps which culminated in a colposcopic evaluation with biopsies in 2014 that showed benign squamous mucosa followed by an LAVH BSO procedure on June 27, 2014 which also revealed no dysplastic changes in the cervix (and benign endometrium fallopian tubes and ovaries).  In March 2019 Pap of the vagina revealed low-grade squamous intraepithelial lesion.  The preceding Pap in January 2018 revealed atypical squamous cells of undetermined significance.  In May 2017 they had revealed LGSIL, vain 1.  A similar finding was present in January 2017 and September 2016.  Pap performed on January 09, 2019 revealed high-grade squamous intraepithelial lesion, vain 2/vain 3 with no high risk HPV.  The patient is not currently sexually active as her husband has GU malignancy.  She is otherwise a very healthy woman who takes hypertensive hypercholesterolemic medications.  She takes a baby aspirin daily for heart health.  Her only prior abdominal surgery was her LAVH.  She has had 2 prior vaginal deliveries.  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  She is a never smoker.  Interval Hx:  On January 29, 2019 she received vaginal cuff biopsies which revealed benign squamous mucosa at the posterior cuff and high-grade squamous intraepithelial lesion VIN 2-3 at the anterior right cuff.  She was treated with 5% 5 fluorouracil topical cream vaginally for 2 months in July and August.  She returns today for post treatment evaluation with Pap test.   Current Meds:  Outpatient Encounter Medications as of 04/27/2019  Medication Sig  . amitriptyline (ELAVIL) 50 MG tablet Take 1 tablet (50 mg total) by mouth at bedtime.  Marland Kitchen amLODipine (NORVASC) 10 MG tablet Take 1 tablet (10 mg total) by mouth daily.  Marland Kitchen aspirin EC 81 MG tablet Take 1 tablet (81 mg total) by mouth daily.  Marland Kitchen atorvastatin (LIPITOR) 20 MG tablet Take 1 tablet (20 mg total) by mouth daily.  Marland Kitchen conjugated estrogens (PREMARIN) vaginal cream Place 1 Applicatorful vaginally 3 (three) times a week. Apply 1applicator in the vagina at night before bedtime Mon/Wed/Friday.  . diclofenac sodium (VOLTAREN) 1 % GEL Apply 4 g topically 4 (four) times daily as needed.  . fluorouracil (EFUDEX) 5 % cream Apply 1 applicator full of efudex  inside of the VAGINA, once a week at bedtime for 8 weeks.  . gabapentin (NEURONTIN) 100 MG capsule Take 1 capsule (100 mg total) by mouth 3 (three) times daily.  Marland Kitchen ibuprofen (ADVIL) 800 MG tablet  TAKE ONE TABLET BY MOUTH EVERY 8 HOURS AS NEEDED FOR MODERATE PAIN  . potassium chloride (K-DUR) 10 MEQ tablet TAKE ONE TABLET BY MOUTH DAILY  . telmisartan-hydrochlorothiazide (MICARDIS HCT) 80-25 MG tablet Take 1 tablet by mouth daily.  . Vitamin D, Ergocalciferol, (DRISDOL) 1.25 MG (50000 UT) CAPS capsule Take 1 capsule (50,000 Units total) by mouth every 7 (seven) days.   No facility-administered encounter medications on file as of 04/27/2019.     Allergy:  Allergies  Allergen Reactions  .  Nitrofurantoin Nausea And Vomiting  . Sulfonamide Derivatives Nausea And Vomiting    Social Hx:   Social History   Socioeconomic History  . Marital status: Married    Spouse name: Not on file  . Number of children: 2  . Years of education: 8  . Highest education level: Not on file  Occupational History  . Occupation: Marine scientist - PACU/ retired    Comment: retired  Scientific laboratory technician  . Financial resource strain: Not hard at all  . Food insecurity    Worry: Never true    Inability: Never true  . Transportation needs    Medical: No    Non-medical: No  Tobacco Use  . Smoking status: Never Smoker  . Smokeless tobacco: Never Used  Substance and Sexual Activity  . Alcohol use: Yes    Comment: occassional  . Drug use: No  . Sexual activity: Yes    Partners: Male    Birth control/protection: None, Post-menopausal  Lifestyle  . Physical activity    Days per week: 3 days    Minutes per session: 40 min  . Stress: To some extent  Relationships  . Social connections    Talks on phone: More than three times a week    Gets together: More than three times a week    Attends religious service: More than 4 times per year    Active member of club or organization: Yes    Attends meetings of clubs or organizations: More than 4 times per year    Relationship status: Married  . Intimate partner violence    Fear of current or ex partner: No    Emotionally abused: No    Physically abused: No    Forced sexual activity: No  Other Topics Concern  . 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 Marriage is in good health. Lots of family stress.     Past Surgical Hx:  Past Surgical History:  Procedure Laterality Date  . COLONOSCOPY    . CYSTOSCOPY    . EYE SURGERY  05/2012   macular hole - left  . EYE  SURGERY  2014   cataract removed left eye  . HYSTEROSCOPY W/D&C  03/06/2013   Procedure: DILATATION AND CURETTAGE /HYSTEROSCOPY;  Surgeon: Margarette Asal, MD;  Location: [facility];  Service: Gynecology;;  . LAPAROSCOPIC ASSISTED VAGINAL HYSTERECTOMY N/A 06/27/2014   Procedure: LAPAROSCOPIC ASSISTED VAGINAL HYSTERECTOMY WITH BILATERAL SALPINGO OOPHORECTOMY;  Surgeon: Margarette Asal, MD;  Location: [facility];  Service: Gynecology;  Laterality: N/A;  . SALPINGOOPHORECTOMY Bilateral 06/27/2014   Procedure: SALPINGO OOPHORECTOMY;  Surgeon: Margarette Asal, MD;  Location: [facility];  Service: Gynecology;  Laterality: Bilateral;  . TONSILLECTOMY    . [place] EXTRACTION      Past Medical  Hx:  Past Medical History:  Diagnosis Date  . Anxiety    due to stress - no stress  . DIVERTICULOSIS, COLON 04/07/2009  . HYPERLIPIDEMIA 05/03/2007  . HYPERTENSION 05/03/2007  . SVD (spontaneous vaginal delivery)    x 2    Past Gynecological History:  svd x 2 No LMP recorded. Patient is postmenopausal.  Family Hx:  Family History  Problem Relation Age of Onset  . Coronary artery disease Father        Hx MI  . Heart disease Father        CAD/MI- fatal  . Breast cancer Maternal Aunt   . Diabetes Maternal Uncle   . Diabetes Cousin     Review of Systems:  Constitutional  Feels well,   ENT Normal appearing ears and nares bilaterally Skin/Breast  No rash, sores, jaundice, itching, dryness Cardiovascular  No chest pain, shortness of breath, or edema  Pulmonary  No cough or wheeze.  Gastro Intestinal  No nausea, vomitting, or diarrhoea. No bright red blood per rectum, no abdominal pain, change in bowel movement, or constipation.  Genito Urinary  No frequency, urgency, dysuria, no bleeding or abnormal discharge Musculo Skeletal  No myalgia, arthralgia, joint swelling or pain  Neurologic  No weakness, numbness, change in gait,  Psychology  No depression, anxiety, insomnia.   Vitals:  Blood  pressure 137/64, pulse 79, temperature 98.5 F (36.9 C), temperature source Temporal, height 5' 6.5" (1.689 m), weight 162 lb 4 oz (73.6 kg), SpO2 100 %.  Physical Exam: WD in NAD Neck  Supple NROM, without any enlargements.  Lymph Node Survey No cervical supraclavicular or inguinal adenopathy Cardiovascular  Pulse normal rate, regularity and rhythm. S1 and S2 normal.  Lungs  Clear to auscultation bilateraly, without wheezes/crackles/rhonchi. Good air movement.  Skin  No rash/lesions/breakdown  Psychiatry  Alert and oriented to person, place, and time  Abdomen  Normoactive bowel sounds, abdomen soft, non-tender and thin without evidence of hernia.  Back No CVA tenderness Genito Urinary  Vulva/vagina: Normal external female genitalia.  No lesions. No discharge or bleeding.  Bladder/urethra:  No lesions or masses, well supported bladder  Vagina: visible erythema in a stripe from right anterior to posterior vaginal cuff (8 to 10 o'clock)  Cervix and uterus surgically absent.  Adnexa: no palpable masses. Rectal  Deferred. Extremities  No bilateral cyanosis, clubbing or edema.   Thereasa Solo, MD  04/27/2019, 2:08 PM

## 2019-04-30 DIAGNOSIS — R87610 Atypical squamous cells of undetermined significance on cytologic smear of cervix (ASC-US): Secondary | ICD-10-CM | POA: Diagnosis not present

## 2019-05-01 LAB — CYTOLOGY - PAP: HPV: NOT DETECTED

## 2019-05-07 LAB — CYTOLOGY - PAP
Diagnosis: UNDETERMINED — AB
High risk HPV: NEGATIVE
Molecular Disclaimer: 56
Molecular Disclaimer: NORMAL

## 2019-05-23 ENCOUNTER — Encounter: Payer: Self-pay | Admitting: Gastroenterology

## 2019-05-28 ENCOUNTER — Inpatient Hospital Stay: Payer: Medicare Other | Attending: Gynecologic Oncology | Admitting: Gynecologic Oncology

## 2019-05-28 ENCOUNTER — Encounter: Payer: Self-pay | Admitting: Gynecologic Oncology

## 2019-05-28 ENCOUNTER — Other Ambulatory Visit: Payer: Self-pay

## 2019-05-28 VITALS — BP 140/50 | HR 91 | Temp 98.3°F | Resp 18 | Ht 66.5 in | Wt 160.4 lb

## 2019-05-28 DIAGNOSIS — D072 Carcinoma in situ of vagina: Secondary | ICD-10-CM

## 2019-05-28 DIAGNOSIS — C519 Malignant neoplasm of vulva, unspecified: Secondary | ICD-10-CM | POA: Diagnosis not present

## 2019-05-28 DIAGNOSIS — Z90722 Acquired absence of ovaries, bilateral: Secondary | ICD-10-CM | POA: Diagnosis not present

## 2019-05-28 DIAGNOSIS — I10 Essential (primary) hypertension: Secondary | ICD-10-CM | POA: Insufficient documentation

## 2019-05-28 DIAGNOSIS — Z90710 Acquired absence of both cervix and uterus: Secondary | ICD-10-CM | POA: Insufficient documentation

## 2019-05-28 DIAGNOSIS — Z79899 Other long term (current) drug therapy: Secondary | ICD-10-CM | POA: Insufficient documentation

## 2019-05-28 DIAGNOSIS — N890 Mild vaginal dysplasia: Secondary | ICD-10-CM | POA: Diagnosis not present

## 2019-05-28 DIAGNOSIS — F419 Anxiety disorder, unspecified: Secondary | ICD-10-CM | POA: Diagnosis not present

## 2019-05-28 DIAGNOSIS — E7800 Pure hypercholesterolemia, unspecified: Secondary | ICD-10-CM | POA: Insufficient documentation

## 2019-05-28 DIAGNOSIS — Z7982 Long term (current) use of aspirin: Secondary | ICD-10-CM | POA: Diagnosis not present

## 2019-05-28 NOTE — Progress Notes (Signed)
Follow-up Note: Gyn-Onc  Consult was requested by Dr. Matthew Saras for the evaluation of Julia Sanchez 73 y.o. female  CC:  Chief Complaint  Patient presents with  . Vulvar cancer [facility])    Assessment/Plan:  Julia Sanchez  is a 73 y.o.  year old with VAIN III of the vagina,  S/p efudex therapy completed August, 2020.  We will follow-up today's Pap. 1/ if benign - recommend return to Dr Matthew Saras for annual surveillance.  2/ if VAIN I - recommend return to Dr Matthew Saras for annual surveillance 3/ if VAIN II-III- recommend vaginal laser.   HPI: Julia Sanchez is a very pleasant 73 year old parous woman who is a former recovery room nurse from [employer] who is seen in consultation at the request of Dr Matthew Saras for HGSIL pap of the vagina.   The patient has never had high risk HPV diagnosed on Pap but has a remote history of multiple low-grade abnormal Paps which culminated in a colposcopic evaluation with biopsies in 2014 that showed benign squamous mucosa followed by an LAVH BSO procedure on June 27, 2014 which also revealed no dysplastic changes in the cervix (and benign endometrium fallopian tubes and ovaries).  In March 2019 Pap of the vagina revealed low-grade squamous intraepithelial lesion.  The preceding Pap in January 2018 revealed atypical squamous cells of undetermined significance.  In May 2017 they had revealed LGSIL, vain 1.  A similar finding was present in January 2017 and September 2016.  Pap performed on January 09, 2019 revealed high-grade squamous intraepithelial lesion, vain 2/vain 3 with no high risk HPV.  The patient is not currently sexually active as her husband has GU malignancy.  She is otherwise a very healthy woman who takes hypertensive hypercholesterolemic medications.  She takes a baby aspirin daily for heart health.  Her only prior abdominal surgery was her LAVH.  She has had 2 prior vaginal deliveries.  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  She is a never smoker.  On January 29, 2019 she received vaginal cuff biopsies which revealed benign squamous mucosa at the posterior cuff and high-grade squamous intraepithelial lesion VIN 2-3 at the anterior right cuff.  She was treated with 5% 5 fluorouracil topical cream vaginally for 2 months in July and August.  She returned 04/27/19 for post treatment evaluation with Pap test. Pap showed ASCUS, negative high risk HPV.  Interval Hx:  She returns today for colposcopy and biopsies.   Current Meds:  Outpatient Encounter Medications as of 05/28/2019  Medication Sig  . amitriptyline (ELAVIL) 50 MG tablet Take 1 tablet (50 mg total) by mouth at bedtime.  Marland Kitchen amLODipine (NORVASC) 10 MG tablet Take 1 tablet (10 mg total) by mouth daily.  Marland Kitchen aspirin EC 81 MG tablet Take 1 tablet (81 mg total) by mouth daily.  Marland Kitchen atorvastatin (LIPITOR) 20 MG tablet Take 1 tablet (20 mg total) by mouth daily.  Marland Kitchen conjugated estrogens (PREMARIN) vaginal cream Place 1 Applicatorful vaginally 3 (three) times a week. Apply 1applicator in the vagina at night before bedtime Mon/Wed/Friday.  . diclofenac sodium (VOLTAREN) 1 % GEL Apply 4 g topically 4 (four) times daily as needed.  . gabapentin (NEURONTIN) 100 MG capsule Take 1 capsule (100 mg total) by mouth 3 (three) times daily.  Marland Kitchen ibuprofen (ADVIL) 800 MG tablet TAKE ONE TABLET BY MOUTH EVERY 8 HOURS AS NEEDED FOR MODERATE PAIN  . potassium chloride (K-DUR)  10 MEQ tablet TAKE ONE TABLET BY MOUTH DAILY  . telmisartan-hydrochlorothiazide (MICARDIS HCT) 80-25 MG tablet Take 1 tablet by mouth daily.  . Vitamin D, Ergocalciferol, (DRISDOL) 1.25 MG (50000 UT) CAPS capsule Take 1 capsule (50,000 Units total) by mouth every 7 (seven) days.  . [DISCONTINUED] fluorouracil (EFUDEX) 5 % cream Apply 1 applicator full of efudex  inside of the VAGINA, once a week at bedtime for 8 weeks.   No facility-administered encounter medications on file  as of 05/28/2019.     Allergy:  Allergies  Allergen Reactions  . Nitrofurantoin Nausea And Vomiting  . Sulfonamide Derivatives Nausea And Vomiting    Social Hx:   Social History   Socioeconomic History  . Marital status: Married    Spouse name: Not on file  . Number of children: 2  . Years of education: 36  . Highest education level: Not on file  Occupational History  . Occupation: Marine scientist - PACU/ retired    Comment: retired  Scientific laboratory technician  . Financial resource strain: Not hard at all  . Food insecurity    Worry: Never true    Inability: Never true  . Transportation needs    Medical: No    Non-medical: No  Tobacco Use  . Smoking status: Never Smoker  . Smokeless tobacco: Never Used  Substance and Sexual Activity  . Alcohol use: Yes    Comment: occassional  . Drug use: No  . Sexual activity: Yes    Partners: Male    Birth control/protection: None, Post-menopausal  Lifestyle  . Physical activity    Days per week: 3 days    Minutes per session: 40 min  . Stress: To some extent  Relationships  . Social connections    Talks on phone: More than three times a week    Gets together: More than three times a week    Attends religious service: More than 4 times per year    Active member of club or organization: Yes    Attends meetings of clubs or organizations: More than 4 times per year    Relationship status: Married  . Intimate partner violence    Fear of current or ex partner: No    Emotionally abused: No    Physically abused: No    Forced sexual activity: No  Other Topics Concern  . 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 Marriage is in good health. Lots of family stress.     Past Surgical Hx:  Past Surgical History:  Procedure Laterality Date  . COLONOSCOPY     . CYSTOSCOPY    . EYE SURGERY  05/2012   macular hole - left  . EYE SURGERY  2014   cataract removed left eye  . HYSTEROSCOPY W/D&C  03/06/2013   Procedure: DILATATION AND CURETTAGE /HYSTEROSCOPY;  Surgeon: Margarette Asal, MD;  Location: [facility];  Service: Gynecology;;  . LAPAROSCOPIC ASSISTED VAGINAL HYSTERECTOMY N/A 06/27/2014   Procedure: LAPAROSCOPIC ASSISTED VAGINAL HYSTERECTOMY WITH BILATERAL SALPINGO OOPHORECTOMY;  Surgeon: Margarette Asal, MD;  Location: [facility];  Service: Gynecology;  Laterality: N/A;  . SALPINGOOPHORECTOMY Bilateral 06/27/2014   Procedure: SALPINGO OOPHORECTOMY;  Surgeon: Margarette Asal, MD;  Location: [facility];  Service: Gynecology;  Laterality: Bilateral;  . TONSILLECTOMY    . [place]IS[place]  TOOTH EXTRACTION      Past Medical Hx:  Past Medical History:  Diagnosis Date  . Anxiety    due to stress - no stress  . DIVERTICULOSIS, COLON 04/07/2009  . HYPERLIPIDEMIA 05/03/2007  . HYPERTENSION 05/03/2007  . SVD (spontaneous vaginal delivery)    x 2    Past Gynecological History:  svd x 2 No LMP recorded. Patient is postmenopausal.  Family Hx:  Family History  Problem Relation Age of Onset  . Coronary artery disease Father        Hx MI  . Heart disease Father        CAD/MI- fatal  . Breast cancer Maternal Aunt   . Diabetes Maternal Uncle   . Diabetes Cousin     Review of Systems:  Constitutional  Feels well,   ENT Normal appearing ears and nares bilaterally Skin/Breast  No rash, sores, jaundice, itching, dryness Cardiovascular  No chest pain, shortness of breath, or edema  Pulmonary  No cough or wheeze.  Gastro Intestinal  No nausea, vomitting, or diarrhoea. No bright red blood per rectum, no abdominal pain, change in bowel movement, or constipation.  Genito Urinary  No frequency, urgency, dysuria, no bleeding or abnormal discharge Musculo Skeletal  No myalgia, arthralgia, joint swelling or pain  Neurologic  No weakness, numbness, change in  gait,  Psychology  No depression, anxiety, insomnia.   Vitals:  Blood pressure (!) 140/50, pulse 91, temperature 98.3 F (36.8 C), temperature source Temporal, resp. rate 18, height 5' 6.5" (1.689 m), weight 160 lb 6 oz (72.7 kg).  Physical Exam: WD in NAD Neck  Supple NROM, without any enlargements.  Lymph Node Survey No cervical supraclavicular or inguinal adenopathy Cardiovascular  Pulse normal rate, regularity and rhythm. S1 and S2 normal.  Lungs  Clear to auscultation bilateraly, without wheezes/crackles/rhonchi. Good air movement.  Skin  No rash/lesions/breakdown  Psychiatry  Alert and oriented to person, place, and time  Abdomen  Normoactive bowel sounds, abdomen soft, non-tender and thin without evidence of hernia.  Back No CVA tenderness Genito Urinary  Vulva/vagina: Normal external female genitalia.  No lesions. No discharge or bleeding.  Bladder/urethra:  No lesions or masses, well supported bladder  Vagina: visible erythema in a stripe from right anterior to posterior vaginal cuff (8 to 10 o'clock)  Cervix and uterus surgically absent.  Adnexa: no palpable masses. Rectal  Deferred. Extremities  No bilateral cyanosis, clubbing or edema.  Procedure Note:  Preop Dx: VAIN III, ASCUS pap Postop Dx: same Procedure: colposcopy of entire vagina with biopsies Surgeon: Dorann Ou, MD EBL: scan Specimens: right vaginal fornix, left vaginal fornix Complications: none Procedure Details: the patient was verbally consented and verbal timeout was performed.  The speculum was inserted into the vagina and the entire vagina was visualized.  The entire vagina was treated with 4% acetic acid.  There is an area of slight acetowhite changing and erythema in the right vaginal fornix.  This was biopsied with a Tischler forcep.  Similar.  Was used to take a representative biopsy from the left vaginal fornix which appeared fairly grossly normal.  Hemostasis was achieved with silver nitrate.   The patient tolerated the procedure well.  Specimens were sent for histopathology.    Thereasa Solo, MD  05/28/2019, 2:35 PM

## 2019-05-28 NOTE — Patient Instructions (Signed)
Dr Serita Grit office will contact you with your biopsy results later this week.  Her office can be called at [place]

## 2019-05-30 LAB — SURGICAL PATHOLOGY

## 2019-05-31 ENCOUNTER — Telehealth: Payer: Self-pay

## 2019-05-31 NOTE — Telephone Encounter (Signed)
Told Ms Jacek that the biopsies showed mild dysplasia.  Therefore she can return to Dr. Matthew Saras for annual surveillance per Ruffin Pyo, NP. Pt verbalized understanding.

## 2019-06-11 DIAGNOSIS — Z23 Encounter for immunization: Secondary | ICD-10-CM | POA: Diagnosis not present

## 2019-11-14 DIAGNOSIS — Z23 Encounter for immunization: Secondary | ICD-10-CM | POA: Diagnosis not present

## 2019-12-10 ENCOUNTER — Other Ambulatory Visit: Payer: Self-pay | Admitting: Internal Medicine

## 2020-01-16 ENCOUNTER — Ambulatory Visit (INDEPENDENT_AMBULATORY_CARE_PROVIDER_SITE_OTHER): Payer: Medicare Other

## 2020-01-16 DIAGNOSIS — Z0000 Encounter for general adult medical examination without abnormal findings: Secondary | ICD-10-CM | POA: Diagnosis not present

## 2020-01-16 NOTE — Progress Notes (Signed)
I connected with [name] today by telephone and verified that I am speaking with the correct person using two identifiers. Location patient: home Location provider: work Persons participating in the virtual visit: [name], Lisette Abu, LPN   I discussed the limitations, risks, security and privacy concerns of performing an evaluation and management service by telephone and the availability of in person appointments. I also discussed with the patient that there may be a patient responsible charge related to this service. The patient expressed understanding and verbally consented to this telephonic visit.    Interactive audio and video telecommunications were attempted between this provider and patient, however failed, due to patient having technical difficulties OR patient did not have access to video capability.  We continued and completed visit with audio only.  Some vital signs may be absent or patient reported.   Time Spent with patient on telephone encounter: 20 minutes  Subjective:   Julia Sanchez is a 74 y.o. female who presents for Medicare Annual (Subsequent) preventive examination.  Review of Systems:  No ROS. Medicare Wellness Visit Cardiac Risk Factors include: advanced age (>85men, >17 women);dyslipidemia;family history of premature cardiovascular disease;hypertension     Objective:     Vitals: There were no vitals taken for this visit.  There is no height or weight on file to calculate BMI.  Advanced Directives 01/16/2020 01/29/2019 12/27/2018 04/21/2018 12/15/2017 06/27/2014 06/20/2014  Does Patient Have a Medical Advance Directive? Yes Yes Yes Yes Yes Yes Yes  Type of Advance Directive - Living will;Healthcare Power of [place];Living will [place];Living will [employer];Living will [place];Living will;Advance instruction for mental health treatment [place]  Does patient want to make changes to medical advance directive? No - Patient declined - - No - Patient declined - No - Patient declined No - Patient declined  Copy of Healthcare Power of Attorney in Chart? 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 [employer], LPN  Past Medical History:  Diagnosis Date  . Anxiety    due to stress - no stress  . DIVERTICULOSIS, COLON 04/07/2009  . HYPERLIPIDEMIA 05/03/2007  . HYPERTENSION 05/03/2007  . SVD (spontaneous vaginal delivery)    x 2   Past Surgical History:  Procedure Laterality Date  . COLONOSCOPY    . CYSTOSCOPY    . EYE SURGERY  05/2012   macular hole - left  . EYE SURGERY  2014   cataract removed left eye  . HYSTEROSCOPY WITH D & C  03/06/2013   Procedure: DILATATION AND CURETTAGE /HYSTEROSCOPY;  Surgeon: Margarette Asal, MD;  Location: [facility];  Service: Gynecology;;  . LAPAROSCOPIC ASSISTED VAGINAL HYSTERECTOMY N/A 06/27/2014   Procedure: LAPAROSCOPIC ASSISTED VAGINAL HYSTERECTOMY WITH BILATERAL  SALPINGO OOPHORECTOMY;  Surgeon: Margarette Asal, MD;  Location: [facility];  Service: Gynecology;  Laterality: N/A;  . SALPINGOOPHORECTOMY Bilateral 06/27/2014   Procedure: SALPINGO OOPHORECTOMY;  Surgeon: Margarette Asal, MD;  Location: [facility];  Service: Gynecology;  Laterality: Bilateral;    . TONSILLECTOMY    . WISDOM TOOTH EXTRACTION     Family History  Problem Relation Age of Onset  . Coronary artery disease Father        Hx MI  . Heart disease Father        CAD/MI- fatal  . Breast cancer Maternal Aunt   . Diabetes Maternal Uncle   . Diabetes Cousin    Social History   Socioeconomic History  . Marital status: Widowed    Spouse name: Not on file  . Number of children: 2  . Years of education: 75  . Highest education level: Not on file  Occupational History  . Occupation: Therapist, sports - PACU/ retired    Comment: retired  Tobacco Use  . Smoking status: Never Smoker  . Smokeless tobacco: Never Used  Substance and Sexual Activity  . Alcohol use: Yes    Comment: occassional  . Drug use: No  . Sexual activity: Not Currently    Partners: Male    Birth control/protection: None, Post-menopausal  Other Topics Concern  . Not on file  Social History Narrative   HSG, [employer] -Designer, jewellery. 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  Lots of family stress. Widowed 2020   Social Determinants of Health   Financial Resource Strain:   . Difficulty of Paying Living Expenses:   Food Insecurity:   . Worried About Charity fundraiser in the Last Year:   . Arboriculturist in the Last Year:   Transportation Needs:   . Film/video editor (Medical):   Marland Kitchen Lack of Transportation (Non-Medical):   Physical Activity:   . Days of Exercise per Week:   . Minutes of Exercise per Session:   Stress:   . Feeling of Stress :   Social Connections:   . Frequency of Communication with Friends and Family:   . Frequency of Social Gatherings with Friends and Family:   . Attends Religious Services:   . Active Member of Clubs or Organizations:   . Attends Archivist Meetings:   Marland Kitchen Marital Status:     Outpatient Encounter Medications as of 01/16/2020   Medication Sig  . amitriptyline (ELAVIL) 50 MG tablet Take 1 tablet (50 mg total) by mouth at bedtime.  Marland Kitchen amLODipine (NORVASC) 10 MG tablet Take 1 tablet (10 mg total) by mouth daily.  Marland Kitchen aspirin EC 81 MG tablet Take 1 tablet (81 mg total) by mouth daily.  Marland Kitchen atorvastatin (LIPITOR) 20 MG tablet Take 1 tablet (20 mg total) by mouth daily.  Marland Kitchen conjugated estrogens (PREMARIN) vaginal cream Place 1 Applicatorful vaginally 3 (three) times a week. Apply 1applicator in the vagina at night before bedtime Mon/Wed/Friday.  . diclofenac sodium (VOLTAREN) 1 % GEL Apply 4 g topically 4 (four) times daily as needed.  Marland Kitchen ibuprofen (ADVIL) 800 MG tablet TAKE ONE TABLET BY MOUTH EVERY 8 HOURS AS NEEDED FOR MODERATE PAIN  . potassium chloride (K-DUR) 10 MEQ tablet TAKE ONE TABLET BY MOUTH DAILY  . telmisartan-hydrochlorothiazide (MICARDIS HCT) 80-25 MG tablet Take 1 tablet by mouth daily.  . Vitamin  D, Ergocalciferol, (DRISDOL) 1.25 MG (50000 UT) CAPS capsule Take 1 capsule (50,000 Units total) by mouth every 7 (seven) days.  Marland Kitchen gabapentin (NEURONTIN) 100 MG capsule Take 1 capsule (100 mg total) by mouth 3 (three) times daily. (Patient not taking: Reported on 01/16/2020)   No facility-administered encounter medications on file as of 01/16/2020.    Activities of Daily Living In your present state of health, do you have any difficulty performing the following activities: 01/16/2020  Hearing? N  Vision? N  Difficulty concentrating or making decisions? N  Walking or climbing stairs? N  Dressing or bathing? N  Doing errands, shopping? N  Preparing Food and eating ? N  Using the Toilet? N  In the past six months, have you accidently leaked urine? N  Do you have problems with loss of bowel control? N  Managing your Medications? N  Managing your Finances? N  Housekeeping or managing your Housekeeping? N  Some recent data might be hidden    Patient Care Team: Biagio Borg, MD as PCP - General (Internal  Medicine) Molli Posey, MD (Obstetrics and Gynecology) Sable Feil, MD (Gastroenterology) Excell Seltzer, MD (Inactive) (General Surgery) Remer Macho, MD (Ophthalmology)    Assessment:   This is a routine wellness examination for Jeanene.  Exercise Activities and Dietary recommendations Current Exercise Habits: The patient does not participate in regular exercise at present(walks some but not alot), Exercise limited by: orthopedic condition(s)(knee pain)  Goals    . Client understands the importance of follow-up with providers by attending scheduled visits (pt-stated)     Continue to monitor my diet for carbohydrates, sugar, and fat. I will continue to be active and enjoy life and family.    . Patient Stated     I want to lose 10 pounds by monitoring my diet for carbohydrates, sugar, and fat. I will continue to be active and enjoy life and family.       Fall Risk Fall Risk  01/16/2020 12/27/2018 12/15/2017 11/29/2017 11/24/2016  Falls in the past year? 0 0 No No No  Number falls in past yr: 0 - - - -  Injury with Fall? 0 - - - -  Risk for fall due to : No Fall Risks - - - -  Follow up Falls evaluation completed - - - -   Is the patient's home free of loose throw rugs in walkways, pet beds, electrical cords, etc?   yes      Grab bars in the bathroom? yes      Handrails on the stairs?   yes      Adequate lighting?   yes  Timed Get Up and Go performed: not indicated  Depression Screen PHQ 2/9 Scores 01/16/2020 12/27/2018 12/15/2017 11/29/2017  PHQ - 2 Score 0 0 0 0  PHQ- 9 Score - - 0 0     Cognitive Function     6CIT Screen 01/16/2020  What Year? 0 points  What month? 0 points  What time? 0 points  Count back from 20 0 points  Months in reverse 0 points  Repeat phrase 0 points  Total Score 0    Immunization History  Administered Date(s) Administered  . Influenza Split 04/23/2011  . Influenza Whole 05/13/2009, 06/09/2012  . Influenza, High Dose Seasonal  PF 06/03/2016  . Influenza,inj,Quad PF,6+ Mos 06/28/2014  . Influenza-Unspecified 05/27/2017, 05/25/2018, 06/29/2018  . Pneumococcal Conjugate-13 10/24/2013  . Pneumococcal Polysaccharide-23 11/20/2008, 01/02/2014  . Td 10/11/2004, 11/19/2014  Qualifies for Shingles Vaccine? Yes  Screening Tests Health Maintenance  Topic Date Due  . COVID-19 Vaccine (1) Never done  . INFLUENZA VACCINE  03/09/2020  . TETANUS/TDAP  11/18/2024  . DEXA SCAN  Completed  . Hepatitis C Screening  Completed  . PNA vac Low Risk Adult  Completed    Cancer Screenings: Lung: Low Dose CT Chest recommended if Age 61-80 years, 30 pack-year currently smoking OR have quit w/in 15years. Patient does qualify. Breast:  Up to date on Mammogram? No   Up to date of Bone Density/Dexa? Yes Colorectal: last done 06/23/2009  Additional Screenings: Hepatitis C Screening: completed     Plan:    Reviewed health maintenance screenings with patient today and relevant education, vaccines, and/or referrals were provided.    Continue doing brain stimulating activities (puzzles, reading, adult coloring books, staying active) to keep memory sharp.    Continue to eat heart healthy diet (full of fruits, vegetables, whole grains, lean protein, water--limit salt, fat, and sugar intake) and increase physical activity as tolerated.   I have personally reviewed and noted the following in the patient's chart:   . Medical and social history . Use of alcohol, tobacco or illicit drugs  . Current medications and supplements . Functional ability and status . Nutritional status . Physical activity . Advanced directives . List of other physicians . Hospitalizations, surgeries, and ER visits in previous 12 months . Vitals . Screenings to include cognitive, depression, and falls . Referrals and appointments  In addition, I have reviewed and discussed with patient certain preventive protocols, quality metrics, and best practice  recommendations. A written personalized care plan for preventive services as well as general preventive health recommendations were provided to patient.     Sheral Flow, LPN  09/09/3084  Nurse Health Advisor  Nurse Notes:  There were no vitals filed for this visit. There is no height or weight on file to calculate BMI.

## 2020-01-16 NOTE — Patient Instructions (Signed)
Julia Sanchez , Thank you for taking time to come for your Medicare Wellness Visit. I appreciate your ongoing commitment to your health goals. Please review the following plan we discussed and let me know if I can assist you in the future.   Screening recommendations/referrals: Colonoscopy: 06/23/2009; repeat in 10 years Mammogram: 07/09/2016 Bone Density: 12/09/2017 Recommended yearly ophthalmology/optometry visit for glaucoma screening and checkup Recommended yearly dental visit for hygiene and checkup  Vaccinations: Influenza vaccine: 06/11/2019 Pneumococcal vaccine: completed Tdap vaccine: 11/19/2014 Shingles vaccine: will check with local pharmacy   Covid-19: completed  Advanced directives: Yes  Conditions/risks identified: Yes  Next appointment: Please schedule your next Annual Wellness Visit in 1 year.   Preventive Care 29 Years and Older, Female Preventive care refers to lifestyle choices and visits with your health care provider that can promote health and wellness. What does preventive care include?  A yearly physical exam. This is also called an annual well check.  Dental exams once or twice a year.  Routine eye exams. Ask your health care provider how often you should have your eyes checked.  Personal lifestyle choices, including:  Daily care of your teeth and gums.  Regular physical activity.  Eating a healthy diet.  Avoiding tobacco and drug use.  Limiting alcohol use.  Practicing safe sex.  Taking low-dose aspirin every day.  Taking vitamin and mineral supplements as recommended by your health care provider. What happens during an annual well check? The services and screenings done by your health care provider during your annual well check will depend on your age, overall health, lifestyle risk factors, and family history of disease. Counseling  Your health care provider may ask you questions about your:  Alcohol use.  Tobacco use.  Drug  use.  Emotional well-being.  Home and relationship well-being.  Sexual activity.  Eating habits.  History of falls.  Memory and ability to understand (cognition).  Work and work Statistician.  Reproductive health. Screening  You may have the following tests or measurements:  Height, weight, and BMI.  Blood pressure.  Lipid and cholesterol levels. These may be checked every 5 years, or more frequently if you are over 21 years old.  Skin check.  Lung cancer screening. You may have this screening every year starting at age 46 if you have a 30-pack-year history of smoking and currently smoke or have quit within the past 15 years.  Fecal occult blood test (FOBT) of the stool. You may have this test every year starting at age 2.  Flexible sigmoidoscopy or colonoscopy. You may have a sigmoidoscopy every 5 years or a colonoscopy every 10 years starting at age 23.  Hepatitis C blood test.  Hepatitis B blood test.  Sexually transmitted disease (STD) testing.  Diabetes screening. This is done by checking your blood sugar (glucose) after you have not eaten for a while (fasting). You may have this done every 1-3 years.  Bone density scan. This is done to screen for osteoporosis. You may have this done starting at age 66.  Mammogram. This may be done every 1-2 years. Talk to your health care provider about how often you should have regular mammograms. Talk with your health care provider about your test results, treatment options, and if necessary, the need for more tests. Vaccines  Your health care provider may recommend certain vaccines, such as:  Influenza vaccine. This is recommended every year.  Tetanus, diphtheria, and acellular pertussis (Tdap, Td) vaccine. You may need a Td booster every  10 years.  Zoster vaccine. You may need this after age 45.  Pneumococcal 13-valent conjugate (PCV13) vaccine. One dose is recommended after age 74.  Pneumococcal polysaccharide  (PPSV23) vaccine. One dose is recommended after age 109. Talk to your health care provider about which screenings and vaccines you need and how often you need them. This information is not intended to replace advice given to you by your health care provider. Make sure you discuss any questions you have with your health care provider. Document Released: 08/22/2015 Document Revised: 04/14/2016 Document Reviewed: 05/27/2015 Elsevier Interactive Patient Education  2017 [place] Prevention in the Home Falls can cause injuries. They can happen to people of all ages. There are many things you can do to make your home safe and to help prevent falls. What can I do on the outside of my home?  Regularly fix the edges of walkways and driveways and fix any cracks.  Remove anything that might make you trip as you walk through a door, such as a raised step or threshold.  Trim any bushes or trees on the path to your home.  Use bright outdoor lighting.  Clear any walking paths of anything that might make someone trip, such as rocks or tools.  Regularly check to see if handrails are loose or broken. Make sure that both sides of any steps have handrails.  Any raised decks and porches should have guardrails on the edges.  Have any leaves, snow, or ice cleared regularly.  Use sand or salt on walking paths during winter.  Clean up any spills in your garage right away. This includes oil or grease spills. What can I do in the bathroom?  Use night lights.  Install grab bars by the toilet and in the tub and shower. Do not use towel bars as grab bars.  Use non-skid mats or decals in the tub or shower.  If you need to sit down in the shower, use a plastic, non-slip stool.  Keep the floor dry. Clean up any water that spills on the floor as soon as it happens.  Remove soap buildup in the tub or shower regularly.  Attach bath mats securely with double-sided non-slip rug tape.  Do not have  throw rugs and other things on the floor that can make you trip. What can I do in the bedroom?  Use night lights.  Make sure that you have a light by your bed that is easy to reach.  Do not use any sheets or blankets that are too big for your bed. They should not hang down onto the floor.  Have a firm chair that has side arms. You can use this for support while you get dressed.  Do not have throw rugs and other things on the floor that can make you trip. What can I do in the kitchen?  Clean up any spills right away.  Avoid walking on wet floors.  Keep items that you use a lot in easy-to-reach places.  If you need to reach something above you, use a strong step stool that has a grab bar.  Keep electrical cords out of the way.  Do not use floor polish or wax that makes floors slippery. If you must use wax, use non-skid floor wax.  Do not have throw rugs and other things on the floor that can make you trip. What can I do with my stairs?  Do not leave any items on the stairs.  Make sure  that there are handrails on both sides of the stairs and use them. Fix handrails that are broken or loose. Make sure that handrails are as long as the stairways.  Check any carpeting to make sure that it is firmly attached to the stairs. Fix any carpet that is loose or worn.  Avoid having throw rugs at the top or bottom of the stairs. If you do have throw rugs, attach them to the floor with carpet tape.  Make sure that you have a light switch at the top of the stairs and the bottom of the stairs. If you do not have them, ask someone to add them for you. What else can I do to help prevent falls?  Wear shoes that:  Do not have high heels.  Have rubber bottoms.  Are comfortable and fit you well.  Are closed at the toe. Do not wear sandals.  If you use a stepladder:  Make sure that it is fully opened. Do not climb a closed stepladder.  Make sure that both sides of the stepladder are  locked into place.  Ask someone to hold it for you, if possible.  Clearly mark and make sure that you can see:  Any grab bars or handrails.  First and last steps.  Where the edge of each step is.  Use tools that help you move around (mobility aids) if they are needed. These include:  Canes.  Walkers.  Scooters.  Crutches.  Turn on the lights when you go into a dark area. Replace any light bulbs as soon as they burn out.  Set up your furniture so you have a clear path. Avoid moving your furniture around.  If any of your floors are uneven, fix them.  If there are any pets around you, be aware of where they are.  Review your medicines with your doctor. Some medicines can make you feel dizzy. This can increase your chance of falling. Ask your doctor what other things that you can do to help prevent falls. This information is not intended to replace advice given to you by your health care provider. Make sure you discuss any questions you have with your health care provider. Document Released: 05/22/2009 Document Revised: 01/01/2016 Document Reviewed: 08/30/2014 Elsevier Interactive Patient Education  2017 [employer].

## 2020-01-18 ENCOUNTER — Ambulatory Visit: Payer: Medicare Other

## 2020-01-18 ENCOUNTER — Ambulatory Visit: Payer: Medicare Other | Admitting: Internal Medicine

## 2020-01-22 ENCOUNTER — Other Ambulatory Visit: Payer: Self-pay

## 2020-01-22 ENCOUNTER — Encounter: Payer: Self-pay | Admitting: Internal Medicine

## 2020-01-22 ENCOUNTER — Ambulatory Visit (INDEPENDENT_AMBULATORY_CARE_PROVIDER_SITE_OTHER): Payer: Medicare Other | Admitting: Internal Medicine

## 2020-01-22 VITALS — BP 130/62 | HR 86 | Temp 98.2°F | Ht 66.5 in | Wt 163.0 lb

## 2020-01-22 DIAGNOSIS — E559 Vitamin D deficiency, unspecified: Secondary | ICD-10-CM

## 2020-01-22 DIAGNOSIS — I10 Essential (primary) hypertension: Secondary | ICD-10-CM | POA: Diagnosis not present

## 2020-01-22 DIAGNOSIS — G5603 Carpal tunnel syndrome, bilateral upper limbs: Secondary | ICD-10-CM | POA: Diagnosis not present

## 2020-01-22 DIAGNOSIS — R739 Hyperglycemia, unspecified: Secondary | ICD-10-CM | POA: Diagnosis not present

## 2020-01-22 DIAGNOSIS — E785 Hyperlipidemia, unspecified: Secondary | ICD-10-CM

## 2020-01-22 LAB — LIPID PANEL
Cholesterol: 177 mg/dL (ref 0–200)
HDL: 59.1 mg/dL (ref >39.00)
LDL Cholesterol: 95 mg/dL (ref 0–99)
NonHDL: 117.66
Total CHOL/HDL Ratio: 3
Triglycerides: 114 mg/dL (ref 0.0–149.0)
VLDL: 22.8 mg/dL (ref 0.0–40.0)

## 2020-01-22 LAB — CBC WITH DIFFERENTIAL/PLATELET
Basophils Absolute: 0.1 10*3/uL (ref 0.0–0.1)
Basophils Relative: 0.9 % (ref 0.0–3.0)
Eosinophils Absolute: 0.3 10*3/uL (ref 0.0–0.7)
Eosinophils Relative: 3.7 % (ref 0.0–5.0)
HCT: 36.8 % (ref 36.0–46.0)
Hemoglobin: 12.7 g/dL (ref 12.0–15.0)
Lymphocytes Relative: 19.6 % (ref 12.0–46.0)
Lymphs Abs: 1.5 10*3/uL (ref 0.7–4.0)
MCHC: 34.5 g/dL (ref 30.0–36.0)
MCV: 88.1 fl (ref 78.0–100.0)
Monocytes Absolute: 0.5 10*3/uL (ref 0.1–1.0)
Monocytes Relative: 7.3 % (ref 3.0–12.0)
Neutro Abs: 5.1 10*3/uL (ref 1.4–7.7)
Neutrophils Relative %: 68.5 % (ref 43.0–77.0)
Platelets: 390 10*3/uL (ref 150.0–400.0)
RBC: 4.18 Mil/uL (ref 3.87–5.11)
RDW: 12.9 % (ref 11.5–15.5)
WBC: 7.5 10*3/uL (ref 4.0–10.5)

## 2020-01-22 LAB — BASIC METABOLIC PANEL
BUN: 12 mg/dL (ref 6–23)
CO2: 29 mEq/L (ref 19–32)
Calcium: 9.9 mg/dL (ref 8.4–10.5)
Chloride: 98 mEq/L (ref 96–112)
Creatinine, Ser: 0.65 mg/dL (ref 0.40–1.20)
GFR: 88.59 mL/min (ref >60.00)
Glucose, Bld: 116 mg/dL — ABNORMAL HIGH (ref 70–99)
Potassium: 3.5 mEq/L (ref 3.5–5.1)
Sodium: 133 mEq/L — ABNORMAL LOW (ref 135–145)

## 2020-01-22 LAB — HEPATIC FUNCTION PANEL
ALT: 17 U/L (ref 0–35)
AST: 16 U/L (ref 0–37)
Albumin: 4.8 g/dL (ref 3.5–5.2)
Alkaline Phosphatase: 93 U/L (ref 39–117)
Bilirubin, Direct: 0.1 mg/dL (ref 0.0–0.3)
Total Bilirubin: 0.4 mg/dL (ref 0.2–1.2)
Total Protein: 7.5 g/dL (ref 6.0–8.3)

## 2020-01-22 LAB — URINALYSIS, ROUTINE W REFLEX MICROSCOPIC
Bilirubin Urine: NEGATIVE
Hgb urine dipstick: NEGATIVE
Ketones, ur: NEGATIVE
Nitrite: NEGATIVE
RBC / HPF: NONE SEEN (ref 0–?)
Specific Gravity, Urine: 1.01 (ref 1.000–1.030)
Total Protein, Urine: NEGATIVE
Urine Glucose: NEGATIVE
Urobilinogen, UA: 0.2 (ref 0.0–1.0)
pH: 6.5 (ref 5.0–8.0)

## 2020-01-22 LAB — HEMOGLOBIN A1C: Hgb A1c MFr Bld: 6.8 % — ABNORMAL HIGH (ref 4.6–6.5)

## 2020-01-22 LAB — TSH: TSH: 1.76 u[IU]/mL (ref 0.35–4.50)

## 2020-01-22 MED ORDER — POTASSIUM CHLORIDE ER 10 MEQ PO TBCR: EXTENDED_RELEASE_TABLET | ORAL | 3 refills | Status: DC

## 2020-01-22 MED ORDER — AMLODIPINE BESYLATE 10 MG PO TABS: 10.0000 mg | ORAL_TABLET | Freq: Every day | ORAL | 3 refills | Status: DC

## 2020-01-22 MED ORDER — TELMISARTAN-HCTZ 80-25 MG PO TABS: 1.0000 | ORAL_TABLET | Freq: Every day | ORAL | 3 refills | Status: DC

## 2020-01-22 MED ORDER — AMITRIPTYLINE HCL 50 MG PO TABS: 50.0000 mg | ORAL_TABLET | Freq: Every day | ORAL | 3 refills | Status: DC

## 2020-01-22 MED ORDER — GABAPENTIN 100 MG PO CAPS: 100.0000 mg | ORAL_CAPSULE | Freq: Three times a day (TID) | ORAL | 1 refills | Status: DC

## 2020-01-22 MED ORDER — ATORVASTATIN CALCIUM 20 MG PO TABS: 20.0000 mg | ORAL_TABLET | Freq: Every day | ORAL | 3 refills | Status: DC

## 2020-01-22 NOTE — Patient Instructions (Addendum)

## 2020-01-22 NOTE — Progress Notes (Signed)
Subjective:    Patient ID: Julia Sanchez, female    DOB: 1946-07-11, 74 y.o.   MRN: [id number]  HPI   Here for yearly f/u;  Overall doing ok;  Pt denies Chest pain, worsening SOB, DOE, wheezing, orthopnea, PND, worsening LE edema, palpitations, dizziness or syncope.  Pt denies neurological change such as new headache, facial or extremity weakness.  Pt denies polydipsia, polyuria, or low sugar symptoms. Pt states overall good compliance with treatment and medications, good tolerability, and has been trying to follow appropriate diet.  Pt denies worsening depressive symptoms, suicidal ideation or panic. No fever, night sweats, wt loss, loss of appetite, or other constitutional symptoms.  Pt states good ability with ADL's, has low fall risk, home safety reviewed and adequate, no other significant changes in hearing or vision, and only occasionally active with exercise. Husband died with copd/covid pna 03/22/2019.   Past Medical History:  Diagnosis Date  . Anxiety    due to stress - no stress  . DIVERTICULOSIS, COLON 04/07/2009  . HYPERLIPIDEMIA 05/03/2007  . HYPERTENSION 05/03/2007  . SVD (spontaneous vaginal delivery)    x 2   Past Surgical History:  Procedure Laterality Date  . COLONOSCOPY    . CYSTOSCOPY    . EYE SURGERY  05/2012   macular hole - left  . EYE SURGERY  2014   cataract removed left eye  . HYSTEROSCOPY WITH D & C  03/06/2013   Procedure: DILATATION AND CURETTAGE /HYSTEROSCOPY;  Surgeon: Margarette Asal, MD;  Location: [facility];  Service: Gynecology;;  . LAPAROSCOPIC ASSISTED VAGINAL HYSTERECTOMY N/A 06/27/2014   Procedure: LAPAROSCOPIC ASSISTED VAGINAL HYSTERECTOMY WITH BILATERAL SALPINGO OOPHORECTOMY;  Surgeon: Margarette Asal, MD;  Location: [facility];  Service: Gynecology;  Laterality: N/A;  . SALPINGOOPHORECTOMY Bilateral 06/27/2014   Procedure: SALPINGO OOPHORECTOMY;  Surgeon: Margarette Asal, MD;  Location: [facility];  Service: Gynecology;  Laterality: Bilateral;  .  TONSILLECTOMY    . WISDOM TOOTH EXTRACTION      reports that she has never smoked. She has never used smokeless tobacco. She reports current alcohol use. She reports that she does not use drugs. family history includes Breast cancer in her maternal aunt; Coronary artery disease in her father; Diabetes in her cousin and maternal uncle; Heart disease in her father. Allergies  Allergen Reactions  . Nitrofurantoin Nausea And Vomiting  . Sulfonamide Derivatives Nausea And Vomiting   Current Outpatient Medications on File Prior to Visit  Medication Sig Dispense Refill  . aspirin EC 81 MG tablet Take 1 tablet (81 mg total) by mouth daily. 90 tablet 11  . conjugated estrogens (PREMARIN) vaginal cream Place 1 Applicatorful vaginally 3 (three) times a week. Apply 1applicator in the vagina at night before bedtime Mon/Wed/Friday. 42.5 g 12  . diclofenac sodium (VOLTAREN) 1 % GEL Apply 4 g topically 4 (four) times daily as needed. 400 g 5  . ibuprofen (ADVIL) 800 MG tablet TAKE ONE TABLET BY MOUTH EVERY 8 HOURS AS NEEDED FOR MODERATE PAIN 30 tablet 1  . Vitamin D, Ergocalciferol, (DRISDOL) 1.25 MG (50000 UT) CAPS capsule Take 1 capsule (50,000 Units total) by mouth every 7 (seven) days. 12 capsule 0   No current facility-administered medications on file prior to visit.   Review of Systems All otherwise neg per pt    Objective:   Physical Exam BP 130/62 (BP Location: Left Arm, Patient Position: Sitting, Cuff Size: Large)   Pulse 86   Temp 98.2 F (36.8  C) (Oral)   Ht 5' 6.5" (1.689 m)   Wt 163 lb (73.9 kg)   SpO2 97%   BMI 25.91 kg/m  VS noted,  Constitutional: Pt appears in NAD HENT: Head: NCAT.  Right Ear: External ear normal.  Left Ear: External ear normal.  Eyes: . Pupils are equal, round, and reactive to light. Conjunctivae and EOM are normal Nose: without d/c or deformity Neck: Neck supple. Gross normal ROM Cardiovascular: Normal rate and regular rhythm.   Pulmonary/Chest: Effort  normal and breath sounds without rales or wheezing.  Abd:  Soft, NT, ND, + BS, no organomegaly Neurological: Pt is alert. At baseline orientation, motor grossly intact Skin: Skin is warm. No rashes, other new lesions, no LE edema Psychiatric: Pt behavior is normal without agitation  All otherwise neg per pt Lab Results  Component Value Date   WBC 7.5 01/22/2020   HGB 12.7 01/22/2020   HCT 36.8 01/22/2020   PLT 390.0 01/22/2020   GLUCOSE 116 (H) 01/22/2020   CHOL 177 01/22/2020   TRIG 114.0 01/22/2020   HDL 59.10 01/22/2020   LDLDIRECT 128.0 02/17/2010   LDLCALC 95 01/22/2020   ALT 17 01/22/2020   AST 16 01/22/2020   NA 133 (L) 01/22/2020   K 3.5 01/22/2020   CL 98 01/22/2020   CREATININE 0.65 01/22/2020   BUN 12 01/22/2020   CO2 29 01/22/2020   TSH 1.76 01/22/2020   HGBA1C 6.8 (H) 01/22/2020      Assessment & Plan:

## 2020-01-23 ENCOUNTER — Encounter: Payer: Self-pay | Admitting: Internal Medicine

## 2020-01-23 NOTE — Assessment & Plan Note (Signed)
stable overall by history and exam, recent data reviewed with pt, and pt to continue medical treatment as before,  to f/u any worsening symptoms or concerns  

## 2020-01-23 NOTE — Assessment & Plan Note (Addendum)
stable overall by history and exam, recent data reviewed with pt, and pt to continue medical treatment as before,  to f/u any worsening symptoms or concerns  I spent 31 minutes in preparing to see the patient by review of recent labs, imaging and procedures, obtaining and reviewing separately obtained history, communicating with the patient and family or caregiver, ordering medications, tests or procedures, and documenting clinical information in the EHR including the differential Dx, treatment, and any further evaluation and other management of hyperglycemia, vit d def, hld, htn

## 2020-01-23 NOTE — Assessment & Plan Note (Signed)
For oral replacement 

## 2020-02-20 DIAGNOSIS — Z6827 Body mass index (BMI) 27.0-27.9, adult: Secondary | ICD-10-CM | POA: Diagnosis not present

## 2020-02-20 DIAGNOSIS — Z1231 Encounter for screening mammogram for malignant neoplasm of breast: Secondary | ICD-10-CM | POA: Diagnosis not present

## 2020-02-20 DIAGNOSIS — Z124 Encounter for screening for malignant neoplasm of cervix: Secondary | ICD-10-CM | POA: Diagnosis not present

## 2020-02-20 DIAGNOSIS — Z779 Other contact with and (suspected) exposures hazardous to health: Secondary | ICD-10-CM | POA: Diagnosis not present

## 2020-02-27 ENCOUNTER — Other Ambulatory Visit: Payer: Self-pay | Admitting: Internal Medicine

## 2020-06-10 ENCOUNTER — Other Ambulatory Visit: Payer: Self-pay | Admitting: Internal Medicine

## 2020-06-10 NOTE — Telephone Encounter (Signed)
Yes I stand corrected, ok for refill x 1

## 2020-06-10 NOTE — Telephone Encounter (Signed)
Sorry unable to refill due to office fill policy  Last seen June 2020  Please to make ROV

## 2020-09-03 DIAGNOSIS — Z779 Other contact with and (suspected) exposures hazardous to health: Secondary | ICD-10-CM | POA: Diagnosis not present

## 2020-09-03 DIAGNOSIS — R87613 High grade squamous intraepithelial lesion on cytologic smear of cervix (HGSIL): Secondary | ICD-10-CM | POA: Diagnosis not present

## 2021-01-22 ENCOUNTER — Encounter: Payer: Self-pay | Admitting: Internal Medicine

## 2021-01-22 ENCOUNTER — Ambulatory Visit: Payer: Medicare Other | Admitting: Internal Medicine

## 2021-01-22 ENCOUNTER — Other Ambulatory Visit: Payer: Self-pay

## 2021-01-22 ENCOUNTER — Ambulatory Visit (INDEPENDENT_AMBULATORY_CARE_PROVIDER_SITE_OTHER): Payer: Medicare Other | Admitting: Internal Medicine

## 2021-01-22 VITALS — BP 162/86 | HR 84 | Temp 98.0°F | Ht 66.5 in | Wt 157.4 lb

## 2021-01-22 DIAGNOSIS — E538 Deficiency of other specified B group vitamins: Secondary | ICD-10-CM | POA: Diagnosis not present

## 2021-01-22 DIAGNOSIS — E559 Vitamin D deficiency, unspecified: Secondary | ICD-10-CM

## 2021-01-22 DIAGNOSIS — L509 Urticaria, unspecified: Secondary | ICD-10-CM | POA: Diagnosis not present

## 2021-01-22 DIAGNOSIS — R739 Hyperglycemia, unspecified: Secondary | ICD-10-CM

## 2021-01-22 DIAGNOSIS — E7800 Pure hypercholesterolemia, unspecified: Secondary | ICD-10-CM

## 2021-01-22 DIAGNOSIS — I10 Essential (primary) hypertension: Secondary | ICD-10-CM | POA: Diagnosis not present

## 2021-01-22 LAB — URINALYSIS, ROUTINE W REFLEX MICROSCOPIC
Bilirubin Urine: NEGATIVE
Hgb urine dipstick: NEGATIVE
Ketones, ur: NEGATIVE
Leukocytes,Ua: NEGATIVE
Nitrite: NEGATIVE
RBC / HPF: NONE SEEN
Specific Gravity, Urine: 1.01 (ref 1.000–1.030)
Total Protein, Urine: NEGATIVE
Urine Glucose: NEGATIVE
Urobilinogen, UA: 0.2 (ref 0.0–1.0)
pH: 7.5 (ref 5.0–8.0)

## 2021-01-22 LAB — HEPATIC FUNCTION PANEL
ALT: 19 U/L (ref 0–35)
AST: 17 U/L (ref 0–37)
Albumin: 4.9 g/dL (ref 3.5–5.2)
Alkaline Phosphatase: 105 U/L (ref 39–117)
Bilirubin, Direct: 0 mg/dL (ref 0.0–0.3)
Total Bilirubin: 0.4 mg/dL (ref 0.2–1.2)
Total Protein: 7.9 g/dL (ref 6.0–8.3)

## 2021-01-22 LAB — CBC WITH DIFFERENTIAL/PLATELET
Basophils Absolute: 0.1 K/uL (ref 0.0–0.1)
Basophils Relative: 0.8 % (ref 0.0–3.0)
Eosinophils Absolute: 0.3 K/uL (ref 0.0–0.7)
Eosinophils Relative: 4.1 % (ref 0.0–5.0)
HCT: 38 % (ref 36.0–46.0)
Hemoglobin: 13.4 g/dL (ref 12.0–15.0)
Lymphocytes Relative: 19.8 % (ref 12.0–46.0)
Lymphs Abs: 1.5 K/uL (ref 0.7–4.0)
MCHC: 35.2 g/dL (ref 30.0–36.0)
MCV: 86.7 fl (ref 78.0–100.0)
Monocytes Absolute: 0.5 K/uL (ref 0.1–1.0)
Monocytes Relative: 6.9 % (ref 3.0–12.0)
Neutro Abs: 5.2 K/uL (ref 1.4–7.7)
Neutrophils Relative %: 68.4 % (ref 43.0–77.0)
Platelets: 400 K/uL (ref 150.0–400.0)
RBC: 4.38 Mil/uL (ref 3.87–5.11)
RDW: 13 % (ref 11.5–15.5)
WBC: 7.6 K/uL (ref 4.0–10.5)

## 2021-01-22 LAB — VITAMIN B12: Vitamin B-12: 592 pg/mL (ref 211–911)

## 2021-01-22 LAB — LIPID PANEL
Cholesterol: 181 mg/dL (ref 0–200)
HDL: 60.9 mg/dL (ref >39.00)
LDL Cholesterol: 95 mg/dL (ref 0–99)
NonHDL: 119.99
Total CHOL/HDL Ratio: 3
Triglycerides: 123 mg/dL (ref 0.0–149.0)
VLDL: 24.6 mg/dL (ref 0.0–40.0)

## 2021-01-22 LAB — BASIC METABOLIC PANEL WITH GFR
BUN: 13 mg/dL (ref 6–23)
CO2: 30 meq/L (ref 19–32)
Calcium: 10.3 mg/dL (ref 8.4–10.5)
Chloride: 99 meq/L (ref 96–112)
Creatinine, Ser: 0.7 mg/dL (ref 0.40–1.20)
GFR: 83.58 mL/min
Glucose, Bld: 117 mg/dL — ABNORMAL HIGH (ref 70–99)
Potassium: 3.8 meq/L (ref 3.5–5.1)
Sodium: 137 meq/L (ref 135–145)

## 2021-01-22 LAB — TSH: TSH: 1.78 u[IU]/mL (ref 0.35–4.50)

## 2021-01-22 LAB — HEMOGLOBIN A1C: Hgb A1c MFr Bld: 6.3 % (ref 4.6–6.5)

## 2021-01-22 LAB — VITAMIN D 25 HYDROXY (VIT D DEFICIENCY, FRACTURES): VITD: 23.58 ng/mL — ABNORMAL LOW (ref 30.00–100.00)

## 2021-01-22 MED ORDER — AMITRIPTYLINE HCL 50 MG PO TABS: 50.0000 mg | ORAL_TABLET | Freq: Every day | ORAL | 1 refills | Status: DC

## 2021-01-22 MED ORDER — AMLODIPINE BESYLATE 10 MG PO TABS: 10.0000 mg | ORAL_TABLET | Freq: Every day | ORAL | 3 refills | Status: DC

## 2021-01-22 MED ORDER — TELMISARTAN-HCTZ 80-25 MG PO TABS: 1.0000 | ORAL_TABLET | Freq: Every day | ORAL | 3 refills | Status: DC

## 2021-01-22 MED ORDER — ATORVASTATIN CALCIUM 20 MG PO TABS: 20.0000 mg | ORAL_TABLET | Freq: Every day | ORAL | 3 refills | Status: DC

## 2021-01-22 MED ORDER — POTASSIUM CHLORIDE ER 10 MEQ PO TBCR: EXTENDED_RELEASE_TABLET | ORAL | 3 refills | Status: DC

## 2021-01-22 NOTE — Progress Notes (Addendum)
Patient ID: Julia Sanchez, female   DOB: [birth year], 75 y.o.   MRN: [id number]         Chief Complaint:: yearly exam       HPI:  Julia Sanchez is a 75 y.o. female overall doing ok, Pt denies chest pain, increased sob or doe, wheezing, orthopnea, PND, increased LE swelling, palpitations, dizziness or syncope.   Pt denies polydipsia, polyuria, or new focal neuro s/s.   Pt denies fever, wt loss, night sweats, loss of appetite, or other constitutional symptoms  Denies worsening depressive symptoms, suicidal ideation, or panic; though has much increased stress recently,   Granddaughter is bipolar living with her for 1 yr and paying some of her bills.   SBP has been 140s at home but declines med change for now.  Did recently have a hive like rash improved with benadryl, but has been faint and off and on very mild over the past wk, without tongue or other sweling or sob.     Wt Readings from Last 3 Encounters:  01/22/21 157 lb 6.4 oz (71.4 kg)  01/22/20 163 lb (73.9 kg)  05/28/19 160 lb 6 oz (72.7 kg)   BP Readings from Last 3 Encounters:  01/22/21 (!) 162/86  01/22/20 130/62  05/28/19 (!) 140/50   Immunization History  Administered Date(s) Administered   Influenza Split 04/23/2011   Influenza Whole 05/13/2009, 06/09/2012   Influenza, High Dose Seasonal PF 06/03/2016   Influenza, Quadrivalent, Recombinant, Inj, Pf 05/25/2018   Influenza,inj,Quad PF,6+ Mos 06/28/2014   Influenza-Unspecified 05/27/2017, 05/25/2018, 06/29/2018   Janssen (J&J) SARS-COV-2 Vaccination 09/10/2019   Pneumococcal Conjugate-13 10/24/2013   Pneumococcal Polysaccharide-23 11/20/2008, 01/02/2014   Td 10/11/2004, 11/19/2014   There are no preventive care reminders to display for this patient.     Past Medical History:  Diagnosis Date   Anxiety    due to stress - no stress   DIVERTICULOSIS, COLON 04/07/2009   HYPERLIPIDEMIA 05/03/2007   HYPERTENSION 05/03/2007   SVD (spontaneous vaginal delivery)    x 2   Past  Surgical History:  Procedure Laterality Date   COLONOSCOPY     CYSTOSCOPY     EYE SURGERY  05/2012   macular hole - left   EYE SURGERY  2014   cataract removed left eye   HYSTEROSCOPY WITH D & C  03/06/2013   Procedure: DILATATION AND CURETTAGE /HYSTEROSCOPY;  Surgeon: Margarette Asal, MD;  Location: [facility];  Service: Gynecology;;   LAPAROSCOPIC ASSISTED VAGINAL HYSTERECTOMY N/A 06/27/2014   Procedure: LAPAROSCOPIC ASSISTED VAGINAL HYSTERECTOMY WITH BILATERAL SALPINGO OOPHORECTOMY;  Surgeon: Margarette Asal, MD;  Location: [facility];  Service: Gynecology;  Laterality: N/A;   SALPINGOOPHORECTOMY Bilateral 06/27/2014   Procedure: SALPINGO OOPHORECTOMY;  Surgeon: Margarette Asal, MD;  Location: [facility];  Service: Gynecology;  Laterality: Bilateral;   TONSILLECTOMY     WISDOM TOOTH EXTRACTION      reports that she has never smoked. She has never used smokeless tobacco. She reports current alcohol use. She reports that she does not use drugs. family history includes Breast cancer in her maternal aunt; Coronary artery disease in her father; Diabetes in her cousin and maternal uncle; Heart disease in her father. Allergies  Allergen Reactions   Nitrofurantoin Nausea And Vomiting   Sulfonamide Derivatives Nausea And Vomiting   Current Outpatient Medications on File Prior to Visit  Medication Sig Dispense Refill   aspirin EC 81 MG tablet Take 1 tablet (81 mg total) by mouth daily. [place]  tablet 11   diclofenac sodium (VOLTAREN) 1 % GEL Apply 4 g topically 4 (four) times daily as needed. 400 g 5   ibuprofen (ADVIL) 800 MG tablet TAKE ONE TABLET BY MOUTH EVERY 8 HOURS AS NEEDED FOR MODERATE PAIN 30 tablet 2   No current facility-administered medications on file prior to visit.        ROS:  All others reviewed and negative.  Objective        PE:  BP (!) 162/86 (BP Location: Left Arm, Patient Position: Sitting, Cuff Size: Normal)   Pulse 84   Temp 98 F (36.7 C) (Oral)   Ht 5' 6.5" (1.689 m)    Wt 157 lb 6.4 oz (71.4 kg)   SpO2 99%   BMI 25.02 kg/m                 Constitutional: Pt appears in NAD               HENT: Head: NCAT.                Right Ear: External ear normal.                 Left Ear: External ear normal.                Eyes: . Pupils are equal, round, and reactive to light. Conjunctivae and EOM are normal               Nose: without d/c or deformity               Neck: Neck supple. Gross normal ROM               Cardiovascular: Normal rate and regular rhythm.                 Pulmonary/Chest: Effort normal and breath sounds without rales or wheezing.                Abd:  Soft, NT, ND, + BS, no organomegaly               Neurological: Pt is alert. At baseline orientation, motor grossly intact               Skin: Skin is warm. No rashes, no other new lesions, LE edema - none               Psychiatric: Pt behavior is normal without agitation   Micro: none  Cardiac tracings I have personally interpreted today:  none  Pertinent Radiological findings (summarize): none   Lab Results  Component Value Date   WBC 7.6 01/22/2021   HGB 13.4 01/22/2021   HCT 38.0 01/22/2021   PLT 400.0 01/22/2021   GLUCOSE 117 (H) 01/22/2021   CHOL 181 01/22/2021   TRIG 123.0 01/22/2021   HDL 60.90 01/22/2021   LDLDIRECT 128.0 02/17/2010   LDLCALC 95 01/22/2021   ALT 19 01/22/2021   AST 17 01/22/2021   NA 137 01/22/2021   K 3.8 01/22/2021   CL 99 01/22/2021   CREATININE 0.70 01/22/2021   BUN 13 01/22/2021   CO2 30 01/22/2021   TSH 1.78 01/22/2021   HGBA1C 6.3 01/22/2021   Assessment/Plan:  Julia Sanchez is a 75 y.o. White or Caucasian [1] female with  has a past medical history of Anxiety, DIVERTICULOSIS, COLON (04/07/2009), HYPERLIPIDEMIA (05/03/2007), HYPERTENSION (05/03/2007), and SVD (spontaneous vaginal delivery).  Hyperlipidemia Lab Results  Component Value Date  [place] 95 01/22/2021   Stable, pt to continue current statin lipitor 20   Hypertensive  disorder BP Readings from Last 3 Encounters:  01/22/21 (!) 162/86  01/22/20 130/62  05/28/19 (!) 140/50   Stable, pt to continue medical treatment norvasc, micardix hct as pt declines med change for now.     Hyperglycemia Lab Results  Component Value Date   HGBA1C 6.3 01/22/2021   Stable, pt to continue current medical treatment  - diet   Vitamin D deficiency Last vitamin D Lab Results  Component Value Date   VD25OH 23.58 (L) 01/22/2021   Low, to start oral replacement   Hives Mild recent, currently not active, continue benadyl prn  Followup: Return in about 1 year (around 01/22/2022).  Cathlean Cower, MD 01/25/2021 9:12 PM [facility]

## 2021-01-22 NOTE — Patient Instructions (Signed)

## 2021-01-25 ENCOUNTER — Encounter: Payer: Self-pay | Admitting: Internal Medicine

## 2021-01-25 DIAGNOSIS — L509 Urticaria, unspecified: Secondary | ICD-10-CM | POA: Insufficient documentation

## 2021-01-25 NOTE — Assessment & Plan Note (Signed)
Lab Results  Component Value Date   LDLCALC 95 01/22/2021   Stable, pt to continue current statin lipitor 20

## 2021-01-25 NOTE — Assessment & Plan Note (Signed)
BP Readings from Last 3 Encounters:  01/22/21 (!) 162/86  01/22/20 130/62  05/28/19 (!) 140/50   Stable, pt to continue medical treatment norvasc, micardix hct as pt declines med change for now.

## 2021-01-25 NOTE — Assessment & Plan Note (Signed)
Lab Results  Component Value Date   HGBA1C 6.3 01/22/2021   Stable, pt to continue current medical treatment  - diet

## 2021-01-25 NOTE — Assessment & Plan Note (Signed)
Mild recent, currently not active, continue benadyl prn

## 2021-01-25 NOTE — Assessment & Plan Note (Signed)
Last vitamin D Lab Results  Component Value Date   VD25OH 23.58 (L) 01/22/2021   Low, to start oral replacement  

## 2021-03-19 DIAGNOSIS — Z01419 Encounter for gynecological examination (general) (routine) without abnormal findings: Secondary | ICD-10-CM | POA: Diagnosis not present

## 2021-03-19 DIAGNOSIS — Z1231 Encounter for screening mammogram for malignant neoplasm of breast: Secondary | ICD-10-CM | POA: Diagnosis not present

## 2021-03-19 DIAGNOSIS — Z7689 Persons encountering health services in other specified circumstances: Secondary | ICD-10-CM | POA: Diagnosis not present

## 2021-03-19 DIAGNOSIS — Z6825 Body mass index (BMI) 25.0-25.9, adult: Secondary | ICD-10-CM | POA: Diagnosis not present

## 2021-04-22 ENCOUNTER — Telehealth: Payer: Self-pay | Admitting: Internal Medicine

## 2021-04-22 NOTE — Telephone Encounter (Signed)
Left message for patient to call me back at [phone] to schedule Medicare Annual Wellness Visit   Last AWV  01/16/20  Please schedule at anytime with [facility] if patient calls the office back.    40 Minutes appointment   Any questions, please call me at [phone]

## 2021-04-29 ENCOUNTER — Telehealth: Payer: Self-pay | Admitting: Internal Medicine

## 2021-04-29 NOTE — Telephone Encounter (Signed)
LVM for pt to rtn my call to schedule awv with [facility]. Please schedule this appt if pt calls the office.

## 2021-05-03 ENCOUNTER — Ambulatory Visit: Payer: Medicare Other

## 2021-05-04 ENCOUNTER — Ambulatory Visit (INDEPENDENT_AMBULATORY_CARE_PROVIDER_SITE_OTHER): Payer: Medicare Other

## 2021-05-04 DIAGNOSIS — Z0000 Encounter for general adult medical examination without abnormal findings: Secondary | ICD-10-CM | POA: Diagnosis not present

## 2021-05-04 NOTE — Progress Notes (Addendum)
Subjective:   Julia Sanchez is a 75 y.o. female who presents for Medicare Annual (Subsequent) preventive examination.  I connected with  Julia Sanchez on 05/04/21 by a video enabled telemedicine application and verified that I am speaking with the correct person using two identifiers.   I discussed the limitations of evaluation and management by telemedicine. The patient expressed understanding and agreed to proceed.  Location of patient: Home  Location of provider: Office  Persons participating in visit Julia Sanchez (patient) and Lanier Prude, CMA/ RT(R)(BD)  Review of Systems    Defer to PCP        Objective:    There were no vitals filed for this visit. There is no height or weight on file to calculate BMI.  Advanced Directives 05/04/2021 01/16/2020 01/29/2019 12/27/2018 04/21/2018 12/15/2017 06/27/2014  Does Patient Have a Medical Advance Directive? Yes Yes Yes Yes Yes Yes Yes  Type of Paramedic of [place];Living will - Living will;Healthcare Power of [place];Living will [place];Living will [place];Living will [place];Living will;Advance instruction for mental health treatment  Does patient want to make changes to medical advance directive? No - Patient declined No - Patient declined - - No - Patient declined - No - Patient declined  Copy of [employer] in Chart? No - copy requested - No - copy requested No - copy requested No - copy requested No - copy requested No - copy requested    Current Medications (verified) Outpatient Encounter Medications as of 05/04/2021  Medication Sig   amitriptyline (ELAVIL) 50 MG tablet Take 1 tablet (50 mg total) by mouth at bedtime.   amLODipine (NORVASC) 10 MG tablet Take 1 tablet (10 mg total) by mouth daily.   aspirin EC 81 MG tablet Take 1 tablet (81 mg total) by mouth daily.   atorvastatin (LIPITOR) 20 MG  tablet Take 1 tablet (20 mg total) by mouth daily.   diclofenac sodium (VOLTAREN) 1 % GEL Apply 4 g topically 4 (four) times daily as needed.   ibuprofen (ADVIL) 800 MG tablet TAKE ONE TABLET BY MOUTH EVERY 8 HOURS AS NEEDED FOR MODERATE PAIN   potassium chloride (KLOR-CON) 10 MEQ tablet TAKE ONE TABLET BY MOUTH DAILY   telmisartan-hydrochlorothiazide (MICARDIS HCT) 80-25 MG tablet Take 1 tablet by mouth daily.   No facility-administered encounter medications on file as of 05/04/2021.    Allergies (verified) Nitrofurantoin and Sulfonamide derivatives   History: Past Medical History:  Diagnosis Date   Anxiety    due to stress - no stress   DIVERTICULOSIS, COLON 04/07/2009   HYPERLIPIDEMIA 05/03/2007   HYPERTENSION 05/03/2007   SVD (spontaneous vaginal delivery)    x 2   Past Surgical History:  Procedure Laterality Date   COLONOSCOPY     CYSTOSCOPY     EYE SURGERY  05/2012   macular hole - left   EYE SURGERY  2014   cataract removed left eye   HYSTEROSCOPY WITH D & C  03/06/2013   Procedure: DILATATION AND CURETTAGE /HYSTEROSCOPY;  Surgeon: Margarette Asal, MD;  Location: [facility];  Service: Gynecology;;   LAPAROSCOPIC ASSISTED VAGINAL HYSTERECTOMY N/A 06/27/2014   Procedure: LAPAROSCOPIC ASSISTED VAGINAL HYSTERECTOMY WITH BILATERAL SALPINGO OOPHORECTOMY;  Surgeon: Margarette Asal, MD;  Location: [facility];  Service: Gynecology;  Laterality: N/A;   SALPINGOOPHORECTOMY Bilateral 06/27/2014   Procedure: SALPINGO OOPHORECTOMY;  Surgeon: Margarette Asal, MD;  Location: [facility];  Service: Gynecology;  Laterality: Bilateral;   TONSILLECTOMY     WISDOM TOOTH EXTRACTION     Family History  Problem Relation Age of Onset   Coronary artery disease Father        Hx MI   Heart disease Father        CAD/MI- fatal   Breast cancer Maternal Aunt    Diabetes Maternal Uncle    Diabetes Cousin    Social History   Socioeconomic History   Marital status: Widowed    Spouse name: Not on file    Number of children: 2   Years of education: 31   Highest education level: Not on file  Occupational History   Occupation: Therapist, 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  Lots of family stress. Widowed 2020   Social Determinants of Health   Financial Resource Strain: Low Risk    Difficulty of Paying Living Expenses: Not hard at all  Food Insecurity: No Food Insecurity   Worried About Charity fundraiser in the Last Year: Never true   Arboriculturist in the Last Year: Never true  Transportation Needs: No Transportation Needs   Lack of Transportation (Medical): No   Lack of Transportation (Non-Medical): No  Physical Activity: Not on file  Stress: No Stress Concern Present   Feeling of Stress : Not at all  Social Connections: Not on file    Tobacco Counseling Counseling given: Not Answered   Clinical Intake:  Pre-visit preparation completed: Yes  Pain : No/denies pain     Diabetes: No  How often do you need to have someone help you when you read instructions, pamphlets, or other written materials from your doctor or pharmacy?: 1 - Never  Diabetic? no  Interpreter Needed?: No      Activities of Daily Living In your present state of health, do you have any difficulty performing the following activities: 05/04/2021 01/22/2021  Hearing? N N  Vision? N N  Difficulty concentrating or making decisions? N N  Walking or  climbing stairs? N N  Dressing or bathing? N N  Doing errands, shopping? N N  Some recent data might be hidden    Patient Care Team: Biagio Borg, MD as PCP - General (Internal Medicine) Molli Posey, MD (Obstetrics and Gynecology) Sable Feil, MD (Gastroenterology) Excell Seltzer, MD (Inactive) (General Surgery) Remer Macho, MD (Ophthalmology)  Indicate any recent Medical Services you may have received from other than [facility] providers in the past year (date may be approximate).     Assessment:   This is a routine wellness examination for Julia Sanchez.  Hearing/Vision screen No results found.  Dietary issues and exercise activities discussed: Current Exercise Habits: Home exercise routine, Type of exercise: walking, Time (Minutes): 20, Frequency (Times/Week): 3, Weekly Exercise (Minutes/Week): 60, Intensity: Moderate, Exercise limited by: orthopedic condition(s)  Goals Addressed   None    Depression Screen PHQ 2/9 Scores 05/04/2021 01/22/2021 01/22/2020 01/22/2020 01/16/2020 12/27/2018 12/15/2017  PHQ - 2 Score 0 0 1 0 0 0 0  PHQ- 9 Score - - - - - - 0    Fall Risk Fall Risk  05/04/2021 01/22/2021 01/22/2020 01/22/2020 01/16/2020  Falls in the past year? 0 0 1 0 0  Number falls in past yr: 0 0 0 0 0  Injury with Fall? 0 0 - 0 0  Risk for fall due to : No Fall Risks No Fall Risks - No Fall Risks No Fall Risks  Follow up Falls evaluation completed - - Falls evaluation completed Falls evaluation completed    FALL RISK PREVENTION PERTAINING TO THE HOME:  Any stairs in or around the home? Yes  If so, are there any without handrails? Yes  Home free of loose throw rugs in walkways, pet beds, electrical cords, etc? Yes  Adequate lighting in your home to reduce risk of falls? Yes   ASSISTIVE DEVICES UTILIZED TO PREVENT FALLS:  Life alert? No  Use of a cane, walker or w/c? No  Grab bars in the bathroom? No  Shower chair or bench in shower? No patient has a portable one  if needed Elevated toilet seat or a handicapped toilet? No   TIMED UP AND GO:  Was the test performed?  N/A .  Length of time to ambulate 10 feet: N/A sec.     Cognitive Function:     6CIT Screen 05/04/2021 01/16/2020  What Year? 0 points 0 points  What month? 0 points 0 points  What time? 0 points 0 points  Count back from 20 0 points 0 points  Months in reverse 0 points 0 points  Repeat phrase 0 points 0 points  Total Score 0 0    Immunizations Immunization History  Administered Date(s) Administered   Influenza Split 04/23/2011   Influenza Whole 05/13/2009, 06/09/2012   Influenza, High Dose Seasonal PF 06/03/2016   Influenza, Quadrivalent, Recombinant, Inj, Pf 05/25/2018   Influenza,inj,Quad PF,6+ Mos 06/28/2014   Influenza-Unspecified 05/27/2017, 05/25/2018, 06/29/2018   Janssen (J&J) SARS-COV-2 Vaccination 09/10/2019   Pneumococcal Conjugate-13 10/24/2013   Pneumococcal Polysaccharide-23 11/20/2008, 01/02/2014   Td 10/11/2004, 11/19/2014    TDAP status: Up to date  Flu Vaccine status: Due, Education has been provided regarding the importance of this vaccine. Advised may receive this vaccine at local pharmacy or Health Dept. Aware to provide a copy of the vaccination record if obtained from local pharmacy or Health Dept. Verbalized acceptance and understanding.  Pneumococcal vaccine status: Up to date  Covid-19 vaccine status: Completed vaccines  Qualifies for Shingles Vaccine? Yes   Zostavax completed No   Shingrix Completed?: No.    Education has been provided regarding the importance of this vaccine. Patient has been advised to call insurance company to determine out of pocket expense if they have not yet received this vaccine. Advised may also receive vaccine at local pharmacy or Health Dept. Verbalized acceptance and understanding.  Screening Tests Health Maintenance  Topic Date Due   Zoster Vaccines- Shingrix (1 of 2) Never done   COVID-19 Vaccine (2 -  Booster for Janssen series) 11/05/2019   INFLUENZA VACCINE  03/09/2021   TETANUS/TDAP  11/18/2024   DEXA SCAN  Completed   Hepatitis C Screening  Completed   HPV VACCINES  Aged Out    Health Maintenance  Health Maintenance Due  Topic Date Due   Zoster  Vaccines- Shingrix (1 of 2) Never done   COVID-19 Vaccine (2 - Booster for Janssen series) 11/05/2019   INFLUENZA VACCINE  03/09/2021    Colorectal cancer screening: Type of screening: Colonoscopy. Completed 06/23/2009. Repeat every 10 years. Patient will follow up in near future for referral.   Mammogram status: Completed 07/09/2018. Repeat every year  Bone Density status: Completed 12/11/2017. Results reflect: Bone density results: OSTEOPENIA. Repeat every 2 years.  Patient will follow up with PCP on getting these test setting up as patient states that at moment she is not able to do them do to her schedule.  Lung Cancer Screening: (Low Dose CT Chest recommended if Age 61-80 years, 30 pack-year currently smoking OR have quit w/in 15years.) does not qualify.   Lung Cancer Screening Referral: N/A  Additional Screening:  Hepatitis C Screening: does qualify; Completed 11/25/2015  Vision Screening: Recommended annual ophthalmology exams for early detection of glaucoma and other disorders of the eye. Is the patient up to date with their annual eye exam?  Yes  Who is the provider or what is the name of the office in which the patient attends annual eye exams? Patient is unsure of name at the moment - was location on [address]  If pt is not established with a provider, would they like to be referred to a provider to establish care? No .   Dental Screening: Recommended annual dental exams for proper oral hygiene  Community Resource Referral / Chronic Care Management: CRR required this visit?  No   CCM required this visit?  No      Plan:     I have personally reviewed and noted the following in the patient's chart:   Medical  and social history Use of alcohol, tobacco or illicit drugs  Current medications and supplements including opioid prescriptions.  Functional ability and status Nutritional status Physical activity Advanced directives List of other physicians Hospitalizations, surgeries, and ER visits in previous 12 months Vitals Screenings to include cognitive, depression, and falls Referrals and appointments  In addition, I have reviewed and discussed with patient certain preventive protocols, quality metrics, and best practice recommendations. A written personalized care plan for preventive services as well as general preventive health recommendations were provided to patient.     Jerilee Field, RT   05/04/2021   Nurse Notes: 30 min non face to face visit. Patient will obtain AVS through [place].      Medical screening examination/treatment/procedure(s) were performed by non-physician practitioner and as supervising physician I was immediately available for consultation/collaboration.  I agree with above. Cathlean Cower, MD

## 2021-09-25 ENCOUNTER — Other Ambulatory Visit: Payer: Self-pay | Admitting: Internal Medicine

## 2022-01-15 ENCOUNTER — Telehealth: Payer: Self-pay | Admitting: Internal Medicine

## 2022-01-15 NOTE — Telephone Encounter (Signed)
Okay for colonoscopy in [place] for colon cancer screening. Set her up for a previsit appointment.  Thanks

## 2022-01-15 NOTE — Telephone Encounter (Signed)
Hi Dr. Henrene Pastor,   Patient called wanting to schedule for her recall colonoscopy that was due in 2020.  Please advise on scheduling.  Thank you

## 2022-01-21 NOTE — Telephone Encounter (Signed)
Called patient to schedule no answer no mailbox.

## 2022-01-26 ENCOUNTER — Ambulatory Visit: Payer: Medicare Other | Admitting: Internal Medicine

## 2022-02-12 ENCOUNTER — Ambulatory Visit (INDEPENDENT_AMBULATORY_CARE_PROVIDER_SITE_OTHER): Payer: Medicare Other | Admitting: Internal Medicine

## 2022-02-12 ENCOUNTER — Encounter: Payer: Self-pay | Admitting: Internal Medicine

## 2022-02-12 VITALS — BP 138/60 | HR 78 | Temp 97.9°F | Ht 66.5 in | Wt 166.0 lb

## 2022-02-12 DIAGNOSIS — I10 Essential (primary) hypertension: Secondary | ICD-10-CM | POA: Diagnosis not present

## 2022-02-12 DIAGNOSIS — E7800 Pure hypercholesterolemia, unspecified: Secondary | ICD-10-CM

## 2022-02-12 DIAGNOSIS — R739 Hyperglycemia, unspecified: Secondary | ICD-10-CM | POA: Diagnosis not present

## 2022-02-12 DIAGNOSIS — E559 Vitamin D deficiency, unspecified: Secondary | ICD-10-CM

## 2022-02-12 DIAGNOSIS — E538 Deficiency of other specified B group vitamins: Secondary | ICD-10-CM | POA: Diagnosis not present

## 2022-02-12 DIAGNOSIS — M25561 Pain in right knee: Secondary | ICD-10-CM

## 2022-02-12 DIAGNOSIS — G8929 Other chronic pain: Secondary | ICD-10-CM

## 2022-02-12 LAB — LIPID PANEL
Cholesterol: 177 mg/dL (ref 0–200)
HDL: 67.8 mg/dL (ref >39.00)
LDL Cholesterol: 86 mg/dL (ref 0–99)
NonHDL: 108.86
Total CHOL/HDL Ratio: 3
Triglycerides: 112 mg/dL (ref 0.0–149.0)
VLDL: 22.4 mg/dL (ref 0.0–40.0)

## 2022-02-12 LAB — CBC WITH DIFFERENTIAL/PLATELET
Basophils Absolute: 0.1 10*3/uL (ref 0.0–0.1)
Basophils Relative: 1 % (ref 0.0–3.0)
Eosinophils Absolute: 0.3 10*3/uL (ref 0.0–0.7)
Eosinophils Relative: 4.6 % (ref 0.0–5.0)
HCT: 36.1 % (ref 36.0–46.0)
Hemoglobin: 12.8 g/dL (ref 12.0–15.0)
Lymphocytes Relative: 18.5 % (ref 12.0–46.0)
Lymphs Abs: 1.2 10*3/uL (ref 0.7–4.0)
MCHC: 35.5 g/dL (ref 30.0–36.0)
MCV: 87.1 fl (ref 78.0–100.0)
Monocytes Absolute: 0.6 10*3/uL (ref 0.1–1.0)
Monocytes Relative: 8.3 % (ref 3.0–12.0)
Neutro Abs: 4.5 10*3/uL (ref 1.4–7.7)
Neutrophils Relative %: 67.6 % (ref 43.0–77.0)
Platelets: 399 10*3/uL (ref 150.0–400.0)
RBC: 4.15 Mil/uL (ref 3.87–5.11)
RDW: 12.4 % (ref 11.5–15.5)
WBC: 6.6 10*3/uL (ref 4.0–10.5)

## 2022-02-12 LAB — URINALYSIS, ROUTINE W REFLEX MICROSCOPIC
Bilirubin Urine: NEGATIVE
Hgb urine dipstick: NEGATIVE
Ketones, ur: NEGATIVE
Nitrite: NEGATIVE
RBC / HPF: NONE SEEN (ref 0–?)
Specific Gravity, Urine: 1.01 (ref 1.000–1.030)
Total Protein, Urine: NEGATIVE
Urine Glucose: NEGATIVE
Urobilinogen, UA: 0.2 (ref 0.0–1.0)
pH: 7.5 (ref 5.0–8.0)

## 2022-02-12 LAB — HEPATIC FUNCTION PANEL
ALT: 17 U/L (ref 0–35)
AST: 15 U/L (ref 0–37)
Albumin: 4.8 g/dL (ref 3.5–5.2)
Alkaline Phosphatase: 104 U/L (ref 39–117)
Bilirubin, Direct: 0.1 mg/dL (ref 0.0–0.3)
Total Bilirubin: 0.5 mg/dL (ref 0.2–1.2)
Total Protein: 7.6 g/dL (ref 6.0–8.3)

## 2022-02-12 LAB — BASIC METABOLIC PANEL
BUN: 14 mg/dL (ref 6–23)
CO2: 30 mEq/L (ref 19–32)
Calcium: 10.2 mg/dL (ref 8.4–10.5)
Chloride: 94 mEq/L — ABNORMAL LOW (ref 96–112)
Creatinine, Ser: 0.72 mg/dL (ref 0.40–1.20)
GFR: 80.21 mL/min (ref >60.00)
Glucose, Bld: 113 mg/dL — ABNORMAL HIGH (ref 70–99)
Potassium: 3.4 mEq/L — ABNORMAL LOW (ref 3.5–5.1)
Sodium: 134 mEq/L — ABNORMAL LOW (ref 135–145)

## 2022-02-12 LAB — VITAMIN D 25 HYDROXY (VIT D DEFICIENCY, FRACTURES): VITD: 31.57 ng/mL (ref 30.00–100.00)

## 2022-02-12 LAB — HEMOGLOBIN A1C: Hgb A1c MFr Bld: 6.1 % (ref 4.6–6.5)

## 2022-02-12 LAB — VITAMIN B12: Vitamin B-12: 533 pg/mL (ref 211–911)

## 2022-02-12 LAB — TSH: TSH: 2.41 u[IU]/mL (ref 0.35–5.50)

## 2022-02-12 MED ORDER — AMLODIPINE BESYLATE 10 MG PO TABS: 10.0000 mg | ORAL_TABLET | Freq: Every day | ORAL | 3 refills | Status: DC

## 2022-02-12 MED ORDER — TELMISARTAN-HCTZ 80-25 MG PO TABS: 1.0000 | ORAL_TABLET | Freq: Every day | ORAL | 3 refills | Status: DC

## 2022-02-12 MED ORDER — AMITRIPTYLINE HCL 50 MG PO TABS: 50.0000 mg | ORAL_TABLET | Freq: Every day | ORAL | 1 refills | Status: DC

## 2022-02-12 MED ORDER — ATORVASTATIN CALCIUM 20 MG PO TABS
20.0000 mg | ORAL_TABLET | Freq: Every day | ORAL | 3 refills | Status: DC
Start: 2022-02-12 — End: 2023-02-14

## 2022-02-12 MED ORDER — IBUPROFEN 800 MG PO TABS: ORAL_TABLET | ORAL | 2 refills | Status: DC

## 2022-02-12 MED ORDER — POTASSIUM CHLORIDE ER 10 MEQ PO TBCR: EXTENDED_RELEASE_TABLET | ORAL | 3 refills | Status: DC

## 2022-02-12 NOTE — Assessment & Plan Note (Signed)
BP Readings from Last 3 Encounters:  02/12/22 138/60  01/22/21 (!) 162/86  01/22/20 130/62   Stable, pt to continue medical treatment norvasc 10 mg qd, micardis hct 80 25 mg qd

## 2022-02-12 NOTE — Assessment & Plan Note (Signed)
Lab Results  Component Value Date   HGBA1C 6.3 01/22/2021   Stable, pt to continue current medical treatment  - diet, wt control, excercise

## 2022-02-12 NOTE — Assessment & Plan Note (Signed)
C/w djd knee - for contd nsaid qhs prn

## 2022-02-12 NOTE — Assessment & Plan Note (Signed)
Last vitamin D Lab Results  Component Value Date   VD25OH 23.58 (L) 01/22/2021   Low, to start oral replacement

## 2022-02-12 NOTE — Assessment & Plan Note (Signed)
Lab Results  Component Value Date   LDLCALC 95 01/22/2021   Stable, pt to continue current statin lipitor 20 mg qd

## 2022-02-12 NOTE — Progress Notes (Signed)
Patient ID: Julia Sanchez, female   DOB: [birth year], 76 y.o.   MRN: [id number]         Chief Complaint:: yearly exam       HPI:  Julia Sanchez is a 76 y.o. female here to f/u, overall not doing too bad, but has some increase stiffness of the right knee, slightly swelling for years but no worse than usual, no giveaways or falls.  Pt denies chest pain, increased sob or doe, wheezing, orthopnea, PND, increased LE swelling, palpitations, dizziness or syncope.   Pt denies polydipsia, polyuria, or new focal neuro s/s.    Pt denies fever, wt loss, night sweats, loss of appetite, or other constitutional symptoms  Has plan for GYN appt in august 2023 for pap and mammogram and dxa        Wt Readings from Last 3 Encounters:  02/12/22 166 lb (75.3 kg)  01/22/21 157 lb 6.4 oz (71.4 kg)  01/22/20 163 lb (73.9 kg)   BP Readings from Last 3 Encounters:  02/12/22 138/60  01/22/21 (!) 162/86  01/22/20 130/62   Immunization History  Administered Date(s) Administered   Influenza Split 04/23/2011   Influenza Whole 05/13/2009, 06/09/2012   Influenza, High Dose Seasonal PF 06/03/2016   Influenza, Quadrivalent, Recombinant, Inj, Pf 05/25/2018   Influenza,inj,Quad PF,6+ Mos 06/28/2014   Influenza-Unspecified 05/27/2017, 05/25/2018, 06/29/2018   Janssen (J&J) SARS-COV-2 Vaccination 09/10/2019   Pneumococcal Conjugate-13 10/24/2013   Pneumococcal Polysaccharide-23 11/20/2008, 01/02/2014   Td 10/11/2004, 11/19/2014   There are no preventive care reminders to display for this patient.     Past Medical History:  Diagnosis Date   Anxiety    due to stress - no stress   DIVERTICULOSIS, COLON 04/07/2009   HYPERLIPIDEMIA 05/03/2007   HYPERTENSION 05/03/2007   SVD (spontaneous vaginal delivery)    x 2   Past Surgical History:  Procedure Laterality Date   COLONOSCOPY     CYSTOSCOPY     EYE SURGERY  05/2012   macular hole - left   EYE SURGERY  2014   cataract removed left eye   HYSTEROSCOPY WITH D &  C  03/06/2013   Procedure: DILATATION AND CURETTAGE /HYSTEROSCOPY;  Surgeon: Margarette Asal, MD;  Location: [facility];  Service: Gynecology;;   LAPAROSCOPIC ASSISTED VAGINAL HYSTERECTOMY N/A 06/27/2014   Procedure: LAPAROSCOPIC ASSISTED VAGINAL HYSTERECTOMY WITH BILATERAL SALPINGO OOPHORECTOMY;  Surgeon: Margarette Asal, MD;  Location: [facility];  Service: Gynecology;  Laterality: N/A;   SALPINGOOPHORECTOMY Bilateral 06/27/2014   Procedure: SALPINGO OOPHORECTOMY;  Surgeon: Margarette Asal, MD;  Location: [facility];  Service: Gynecology;  Laterality: Bilateral;   TONSILLECTOMY     WISDOM TOOTH EXTRACTION      reports that she has never smoked. She has never used smokeless tobacco. She reports current alcohol use. She reports that she does not use drugs. family history includes Breast cancer in her maternal aunt; Coronary artery disease in her father; Diabetes in her cousin and maternal uncle; Heart disease in her father. Allergies  Allergen Reactions   Nitrofurantoin Nausea And Vomiting   Sulfonamide Derivatives Nausea And Vomiting   Current Outpatient Medications on File Prior to Visit  Medication Sig Dispense Refill   aspirin EC 81 MG tablet Take 1 tablet (81 mg total) by mouth daily. 90 tablet 11   diclofenac sodium (VOLTAREN) 1 % GEL Apply 4 g topically 4 (four) times daily as needed. 400 g 5   No current facility-administered medications on file prior to visit.  ROS:  All others reviewed and negative.  Objective        PE:  BP 138/60 (BP Location: Right Arm, Patient Position: Sitting, Cuff Size: Large)   Pulse 78   Temp 97.9 F (36.6 C) (Oral)   Ht 5' 6.5" (1.689 m)   Wt 166 lb (75.3 kg)   SpO2 98%   BMI 26.39 kg/m                 Constitutional: Pt appears in NAD               HENT: Head: NCAT.                Right Ear: External ear normal.                 Left Ear: External ear normal.                Eyes: . Pupils are equal, round, and reactive to light.  Conjunctivae and EOM are normal               Nose: without d/c or deformity               Neck: Neck supple. Gross normal ROM               Cardiovascular: Normal rate and regular rhythm.                 Pulmonary/Chest: Effort normal and breath sounds without rales or wheezing.                Abd:  Soft, NT, ND, + BS, no organomegaly               Neurological: Pt is alert. At baseline orientation, motor grossly intact               Skin: Skin is warm. No rashes, no other new lesions, LE edema - none               Psychiatric: Pt behavior is normal without agitation   Micro: none  Cardiac tracings I have personally interpreted today:  none  Pertinent Radiological findings (summarize): none   Lab Results  Component Value Date   WBC 7.6 01/22/2021   HGB 13.4 01/22/2021   HCT 38.0 01/22/2021   PLT 400.0 01/22/2021   GLUCOSE 117 (H) 01/22/2021   CHOL 181 01/22/2021   TRIG 123.0 01/22/2021   HDL 60.90 01/22/2021   LDLDIRECT 128.0 02/17/2010   LDLCALC 95 01/22/2021   ALT 19 01/22/2021   AST 17 01/22/2021   NA 137 01/22/2021   K 3.8 01/22/2021   CL 99 01/22/2021   CREATININE 0.70 01/22/2021   BUN 13 01/22/2021   CO2 30 01/22/2021   TSH 1.78 01/22/2021   HGBA1C 6.3 01/22/2021   Assessment/Plan:  Julia Sanchez is a 76 y.o. White or Caucasian [1] female with  has a past medical history of Anxiety, DIVERTICULOSIS, COLON (04/07/2009), HYPERLIPIDEMIA (05/03/2007), HYPERTENSION (05/03/2007), and SVD (spontaneous vaginal delivery).  Vitamin D deficiency Last vitamin D Lab Results  Component Value Date   VD25OH 23.58 (L) 01/22/2021   Low, to start oral replacement   Hypertensive disorder BP Readings from Last 3 Encounters:  02/12/22 138/60  01/22/21 (!) 162/86  01/22/20 130/62   Stable, pt to continue medical treatment norvasc 10 mg qd, micardis hct 80 25 mg qd    Hyperlipidemia Lab Results  Component Value Date   [facility]  95 01/22/2021   Stable, pt to continue  current statin lipitor 20 mg qd   Hyperglycemia Lab Results  Component Value Date   HGBA1C 6.3 01/22/2021   Stable, pt to continue current medical treatment  - diet, wt control, excercise   Pain in right knee C/w djd knee - for contd nsaid qhs prn  Followup: Return in about 1 year (around 02/13/2023).  Cathlean Cower, MD 02/12/2022 8:51 AM [facility]

## 2022-02-12 NOTE — Patient Instructions (Addendum)
Please check on the Shingles shot at your local pharmacy  Please continue all other medications as before, and refills have been done if requested.  Please have the pharmacy call with any other refills you may need.  Please continue your efforts at being more active, low cholesterol diet, and weight control.  You are otherwise up to date with prevention measures today.  Please keep your appointments with your specialists as you may have planned  Please go to the LAB at the blood drawing area for the tests to be done  You will be contacted by phone if any changes need to be made immediately.  Otherwise, you will receive a letter about your results with an explanation, but please check with MyChart first.  Please remember to sign up for MyChart if you have not done so, as this will be important to you in the future with finding out test results, communicating by private email, and scheduling acute appointments online when needed.  Please make an Appointment to return for your 1 year visit, or sooner if needed, with Lab testing by Appointment as well, to be done about 3-5 days before at the [facility] (so this is for TWO appointments - please see the scheduling desk as you leave

## 2022-03-22 ENCOUNTER — Other Ambulatory Visit: Payer: Self-pay | Admitting: Internal Medicine

## 2022-05-05 ENCOUNTER — Ambulatory Visit (INDEPENDENT_AMBULATORY_CARE_PROVIDER_SITE_OTHER): Payer: Medicare Other

## 2022-05-05 DIAGNOSIS — Z0000 Encounter for general adult medical examination without abnormal findings: Secondary | ICD-10-CM | POA: Diagnosis not present

## 2022-05-05 NOTE — Patient Instructions (Signed)
Julia Sanchez , Thank you for taking time to come for your Medicare Wellness Visit. I appreciate your ongoing commitment to your health goals. Please review the following plan we discussed and let me know if I can assist you in the future.   These are the goals we discussed:  Goals       Client understands the importance of follow-up with providers by attending scheduled visits (pt-stated)      Continue to monitor my diet for carbohydrates, sugar, and fat. I will continue to be active and enjoy life and family.      My goal is to continue to maintain my health and continue to feel good.      Patient Stated      I want to lose 10 pounds by monitoring my diet for carbohydrates, sugar, and fat. I will continue to be active and enjoy life and family.        This is a list of the screening recommended for you and due dates:  Health Maintenance  Topic Date Due   Flu Shot  03/09/2022   Zoster (Shingles) Vaccine (1 of 2) 05/15/2022*   Tetanus Vaccine  11/18/2024   Pneumonia Vaccine  Completed   DEXA scan (bone density measurement)  Completed   Hepatitis C Screening: USPSTF Recommendation to screen - Ages 68-79 yo.  Completed   HPV Vaccine  Aged Out   Colon Cancer Screening  Discontinued   COVID-19 Vaccine  Discontinued  *Topic was postponed. The date shown is not the original due date.    Advanced directives: Yes; Please bring a copy of your health care power of attorney and living will to the office at your convenience.  Conditions/risks identified: Yes  Next appointment: Follow up in one year for your annual wellness visit.   Preventive Care 78 Years and Older, Female Preventive care refers to lifestyle choices and visits with your health care provider that can promote health and wellness. What does preventive care include? A yearly physical exam. This is also called an annual well check. Dental exams once or twice a year. Routine eye exams. Ask your health care provider how often  you should have your eyes checked. Personal lifestyle choices, including: Daily care of your teeth and gums. Regular physical activity. Eating a healthy diet. Avoiding tobacco and drug use. Limiting alcohol use. Practicing safe sex. Taking low-dose aspirin every day. Taking vitamin and mineral supplements as recommended by your health care provider. What happens during an annual well check? The services and screenings done by your health care provider during your annual well check will depend on your age, overall health, lifestyle risk factors, and family history of disease. Counseling  Your health care provider may ask you questions about your: Alcohol use. Tobacco use. Drug use. Emotional well-being. Home and relationship well-being. Sexual activity. Eating habits. History of falls. Memory and ability to understand (cognition). Work and work Statistician. Reproductive health. Screening  You may have the following tests or measurements: Height, weight, and BMI. Blood pressure. Lipid and cholesterol levels. These may be checked every 5 years, or more frequently if you are over 49 years old. Skin check. Lung cancer screening. You may have this screening every year starting at age 78 if you have a 30-pack-year history of smoking and currently smoke or have quit within the past 15 years. Fecal occult blood test (FOBT) of the stool. You may have this test every year starting at age 78. Flexible sigmoidoscopy or colonoscopy. You  may have a sigmoidoscopy every 5 years or a colonoscopy every 10 years starting at age 78. Hepatitis C blood test. Hepatitis B blood test. Sexually transmitted disease (STD) testing. Diabetes screening. This is done by checking your blood sugar (glucose) after you have not eaten for a while (fasting). You may have this done every 1-3 years. Bone density scan. This is done to screen for osteoporosis. You may have this done starting at age 78. Mammogram. This  may be done every 1-2 years. Talk to your health care provider about how often you should have regular mammograms. Talk with your health care provider about your test results, treatment options, and if necessary, the need for more tests. Vaccines  Your health care provider may recommend certain vaccines, such as: Influenza vaccine. This is recommended every year. Tetanus, diphtheria, and acellular pertussis (Tdap, Td) vaccine. You may need a Td booster every 10 years. Zoster vaccine. You may need this after age 78. Pneumococcal 13-valent conjugate (PCV13) vaccine. One dose is recommended after age 78. Pneumococcal polysaccharide (PPSV23) vaccine. One dose is recommended after age 78. Talk to your health care provider about which screenings and vaccines you need and how often you need them. This information is not intended to replace advice given to you by your health care provider. Make sure you discuss any questions you have with your health care provider. Document Released: 08/22/2015 Document Revised: 04/14/2016 Document Reviewed: 05/27/2015 Elsevier Interactive Patient Education  2017 [employer] Prevention in the Home Falls can cause injuries. They can happen to people of all ages. There are many things you can do to make your home safe and to help prevent falls. What can I do on the outside of my home? Regularly fix the edges of walkways and driveways and fix any cracks. Remove anything that might make you trip as you walk through a door, such as a raised step or threshold. Trim any bushes or trees on the path to your home. Use bright outdoor lighting. Clear any walking paths of anything that might make someone trip, such as rocks or tools. Regularly check to see if handrails are loose or broken. Make sure that both sides of any steps have handrails. Any raised decks and porches should have guardrails on the edges. Have any leaves, snow, or ice cleared regularly. Use sand or  salt on walking paths during winter. Clean up any spills in your garage right away. This includes oil or grease spills. What can I do in the bathroom? Use night lights. Install grab bars by the toilet and in the tub and shower. Do not use towel bars as grab bars. Use non-skid mats or decals in the tub or shower. If you need to sit down in the shower, use a plastic, non-slip stool. Keep the floor dry. Clean up any water that spills on the floor as soon as it happens. Remove soap buildup in the tub or shower regularly. Attach bath mats securely with double-sided non-slip rug tape. Do not have throw rugs and other things on the floor that can make you trip. What can I do in the bedroom? Use night lights. Make sure that you have a light by your bed that is easy to reach. Do not use any sheets or blankets that are too big for your bed. They should not hang down onto the floor. Have a firm chair that has side arms. You can use this for support while you get dressed. Do not have throw  rugs and other things on the floor that can make you trip. What can I do in the kitchen? Clean up any spills right away. Avoid walking on wet floors. Keep items that you use a lot in easy-to-reach places. If you need to reach something above you, use a strong step stool that has a grab bar. Keep electrical cords out of the way. Do not use floor polish or wax that makes floors slippery. If you must use wax, use non-skid floor wax. Do not have throw rugs and other things on the floor that can make you trip. What can I do with my stairs? Do not leave any items on the stairs. Make sure that there are handrails on both sides of the stairs and use them. Fix handrails that are broken or loose. Make sure that handrails are as long as the stairways. Check any carpeting to make sure that it is firmly attached to the stairs. Fix any carpet that is loose or worn. Avoid having throw rugs at the top or bottom of the stairs. If  you do have throw rugs, attach them to the floor with carpet tape. Make sure that you have a light switch at the top of the stairs and the bottom of the stairs. If you do not have them, ask someone to add them for you. What else can I do to help prevent falls? Wear shoes that: Do not have high heels. Have rubber bottoms. Are comfortable and fit you well. Are closed at the toe. Do not wear sandals. If you use a stepladder: Make sure that it is fully opened. Do not climb a closed stepladder. Make sure that both sides of the stepladder are locked into place. Ask someone to hold it for you, if possible. Clearly mark and make sure that you can see: Any grab bars or handrails. First and last steps. Where the edge of each step is. Use tools that help you move around (mobility aids) if they are needed. These include: Canes. Walkers. Scooters. Crutches. Turn on the lights when you go into a dark area. Replace any light bulbs as soon as they burn out. Set up your furniture so you have a clear path. Avoid moving your furniture around. If any of your floors are uneven, fix them. If there are any pets around you, be aware of where they are. Review your medicines with your doctor. Some medicines can make you feel dizzy. This can increase your chance of falling. Ask your doctor what other things that you can do to help prevent falls. This information is not intended to replace advice given to you by your health care provider. Make sure you discuss any questions you have with your health care provider. Document Released: 05/22/2009 Document Revised: 01/01/2016 Document Reviewed: 08/30/2014 Elsevier Interactive Patient Education  2017 [employer].

## 2022-05-05 NOTE — Progress Notes (Signed)
Virtual Visit via Telephone Note  I connected with  Julia Sanchez on 05/05/22 at  9:15 AM EDT by telephone and verified that I am speaking with the correct person using two identifiers.  Location: Patient: home Provider: [place] Persons participating in the virtual visit: [place]   I discussed the limitations, risks, security and privacy concerns of performing an evaluation and management service by telephone and the availability of in person appointments. The patient expressed understanding and agreed to proceed.  Interactive audio and video telecommunications were attempted between this nurse and patient, however failed, due to patient having technical difficulties OR patient did not have access to video capability.  We continued and completed visit with audio only.  Some vital signs may be absent or patient reported.   Sheral Flow, LPN  Subjective:   Julia Sanchez is a 76 y.o. female who presents for Medicare Annual (Subsequent) preventive examination.  Review of Systems     Cardiac Risk Factors include: advanced age (>53mn, >>49women);dyslipidemia;hypertension;family history of premature cardiovascular disease     Objective:    There were no vitals filed for this visit. There is no height or weight on file to calculate BMI.     05/05/2022    9:19 AM 05/04/2021    5:40 PM 01/16/2020    2:50 PM 01/29/2019   10:05 AM 12/27/2018    2:36 PM 04/21/2018    8:47 AM 12/15/2017    1:27 PM  Advanced Directives  Does Patient Have a Medical Advance Directive? Yes Yes Yes Yes Yes Yes Yes  Type of AParamedicof ADentonLiving will [place]we[place]Living will  Living will;Healthcare Power of [place]redoniaLiving will HV[place] SummitLiving will HVictorLiving will  Does patient want to make changes to medical advance directive?  No - Patient declined No -  Patient declined   No - Patient declined   Copy of HDe[place]lein Chart? No - copy requested No - copy requested  No - copy requested No - copy requested No - copy requested No - copy requested    Current Medications (verified) Outpatient Encounter Medications as of 05/05/2022  Medication Sig   amitriptyline (ELAVIL) 50 MG tablet TAKE ONE TABLET BY MOUTH EVERY NIGHT AT BEDTIME   amLODipine (NORVASC) 10 MG tablet Take 1 tablet (10 mg total) by mouth daily.   aspirin EC 81 MG tablet Take 1 tablet (81 mg total) by mouth daily.   atorvastatin (LIPITOR) 20 MG tablet Take 1 tablet (20 mg total) by mouth daily.   diclofenac sodium (VOLTAREN) 1 % GEL Apply 4 g topically 4 (four) times daily as needed.   ibuprofen (ADVIL) 800 MG tablet 1-2 tab by mouth at bedtime as needed for pain   potassium chloride (KLOR-CON) 10 MEQ tablet TAKE ONE TABLET BY MOUTH DAILY   telmisartan-hydrochlorothiazide (MICARDIS HCT) 80-25 MG tablet Take 1 tablet by mouth daily.   No facility-administered encounter medications on file as of 05/05/2022.    Allergies (verified) Nitrofurantoin and Sulfonamide derivatives   History: Past Medical History:  Diagnosis Date   Anxiety    due to stress - no stress   DIVERTICULOSIS, COLON 04/07/2009   HYPERLIPIDEMIA 05/03/2007   HYPERTENSION 05/03/2007   SVD (spontaneous vaginal delivery)    x 2   Past Surgical History:  Procedure Laterality Date   COLONOSCOPY     CYSTOSCOPY     EYE  SURGERY  05/2012   macular hole - left   EYE SURGERY  2014   cataract removed left eye   HYSTEROSCOPY WITH D & C  03/06/2013   Procedure: DILATATION AND CURETTAGE /HYSTEROSCOPY;  Surgeon: Margarette Asal, MD;  Location: [facility];  Service: Gynecology;;   LAPAROSCOPIC ASSISTED VAGINAL HYSTERECTOMY N/A 06/27/2014   Procedure: LAPAROSCOPIC ASSISTED VAGINAL HYSTERECTOMY WITH BILATERAL SALPINGO OOPHORECTOMY;  Surgeon: Margarette Asal, MD;  Location: [facility];  Service: Gynecology;   Laterality: N/A;   SALPINGOOPHORECTOMY Bilateral 06/27/2014   Procedure: SALPINGO OOPHORECTOMY;  Surgeon: Margarette Asal, MD;  Location: [facility];  Service: Gynecology;  Laterality: Bilateral;   TONSILLECTOMY     WISDOM TOOTH EXTRACTION     Family History  Problem Relation Age of Onset   Coronary artery disease Father        Hx MI   Heart disease Father        CAD/MI- fatal   Breast cancer Maternal Aunt    Diabetes Maternal Uncle    Diabetes Cousin    Social History   Socioeconomic History   Marital status: Widowed    Spouse name: Not on file   Number of children: 2   Years of education: 36   Highest education level: Not on file  Occupational History   Occupation: Therapist, 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  Lots of family stress. Widowed 2020   Social Determinants of Health   Financial Resource Strain: Low Risk  (05/05/2022)   Overall Financial Resource Strain (CARDIA)    Difficulty of Paying Living Expenses: Not hard at all  Food Insecurity: No Food Insecurity (05/05/2022)   Hunger Vital Sign    Worried About Running Out of Food in the Last Year: Never true    Ran Out of Food in the Last Year: Never true  Transportation Needs: No Transportation Needs (05/05/2022)   PRAPARE - Hydrologist (Medical): No    Lack of Transportation (Non-Medical): No  Physical Activity: Insufficiently Active  (05/05/2022)   Exercise Vital Sign    Days of Exercise per Week: 3 days    Minutes of Exercise per Session: 40 min  Stress: No Stress Concern Present (05/05/2022)   [place]    Feeling of Stress : Not at all  Social Connections: Moderately Integrated (05/05/2022)   Social Connection and Isolation Panel [NHANES]    Frequency of Communication with Friends and Family: More than three times a week    Frequency of Social Gatherings with Friends and Family: More than three times a week    Attends Religious Services: More than 4 times per year    Active Member of [employer] or Organizations: Yes    Attends Archivist Meetings: More than 4 times per year    Marital  Status: Widowed    Tobacco Counseling Counseling given: Not Answered   Clinical Intake:  Pre-visit preparation completed: Yes  Pain : No/denies pain     BMI - recorded: 26.39 (02/12/2022) Nutritional Risks: None Diabetes: No  How often do you need to have someone help you when you read instructions, pamphlets, or other written materials from your doctor or pharmacy?: 1 - Never What is the last grade level you completed in school?: HSG  Diabetic? no  Interpreter Needed?: No  Information entered by :: Lisette Abu, LPN.   Activities of Daily Living    05/05/2022    9:34 AM  In your present state of health, do you have any difficulty performing the following activities:  Hearing? 0  Vision? 0  Difficulty concentrating or making decisions? 0  Walking or climbing stairs? 0  Dressing or bathing? 0  Doing errands, shopping? 0  Preparing Food and eating ? N  Using the Toilet? N  In the past six months, have you accidently leaked urine? N  Do you have problems with loss of bowel control? N  Managing your Medications? N  Managing your Finances? N  Housekeeping or managing your Housekeeping? N    Patient Care Team: Biagio Borg, MD as PCP -  General (Internal Medicine) Molli Posey, MD (Obstetrics and Gynecology) Sable Feil, MD (Gastroenterology) Excell Seltzer, MD (Inactive) (General Surgery) Remer Macho, MD (Ophthalmology)  Indicate any recent Medical Services you may have received from other than [facility] providers in the past year (date may be approximate).     Assessment:   This is a routine wellness examination for [place].  Hearing/Vision screen Hearing Screening - Comments:: Denies hearing difficulties   Vision Screening - Comments:: Patient wears readers. Eye exam done at: [facility] issues and exercise activities discussed: Current Exercise Habits: Home exercise routine, Type of exercise: walking, Time (Minutes): 30, Frequency (Times/Week): 4, Weekly Exercise (Minutes/Week): 120, Intensity: Moderate, Exercise limited by: None identified   Goals Addressed             This Visit's Progress    My goal is to continue to maintain my health and continue to feel good.        Depression Screen    05/05/2022    9:24 AM 02/12/2022    8:14 AM 05/04/2021    5:37 PM 01/22/2021    9:15 AM 01/22/2020   10:30 AM 01/22/2020   10:01 AM 01/16/2020    2:54 PM  PHQ 2/9 Scores  PHQ - 2 Score 0 0 0 0 1 0 0  PHQ- 9 Score  0         Fall Risk    05/05/2022    9:20 AM 02/12/2022    8:32 AM 02/12/2022    8:14 AM 05/04/2021    5:42 PM 01/22/2021    9:15 AM  Fall Risk   Falls in the past year? 0 0 0 0 0  Number falls in past yr: 0 0 0 0 0  Injury with Fall? 0 0 0 0 0  Risk for fall due to : No Fall Risks   No Fall Risks No Fall Risks  Follow up Falls prevention discussed   Falls evaluation completed     FALL RISK PREVENTION PERTAINING TO THE HOME:  Any stairs in or around the home? Yes  If so, are there any without handrails? No  Home free of loose throw rugs in walkways, pet beds, electrical  cords, etc? Yes  Adequate lighting in your home to reduce risk of falls? Yes   ASSISTIVE  DEVICES UTILIZED TO PREVENT FALLS:  Life alert? No  Use of a cane, walker or w/c? No  Grab bars in the bathroom? Yes  Shower chair or bench in shower? No  Elevated toilet seat or a handicapped toilet? Yes   TIMED UP AND GO: PHONE VISIT  Was the test performed? No .    Cognitive Function:        05/05/2022    9:34 AM 05/04/2021    5:44 PM 01/16/2020    2:55 PM  6CIT Screen  What Year? 0 points 0 points 0 points  What month? 0 points 0 points 0 points  What time? 0 points 0 points 0 points  Count back from 20 0 points 0 points 0 points  Months in reverse 0 points 0 points 0 points  Repeat phrase 0 points 0 points 0 points  Total Score 0 points 0 points 0 points    Immunizations Immunization History  Administered Date(s) Administered   Influenza Split 04/23/2011   Influenza Whole 05/13/2009, 06/09/2012   Influenza, High Dose Seasonal PF 06/03/2016   Influenza, Quadrivalent, Recombinant, Inj, Pf 05/25/2018   Influenza,inj,Quad PF,6+ Mos 06/28/2014   Influenza-Unspecified 05/27/2017, 05/25/2018, 06/29/2018   Janssen (J&J) SARS-COV-2 Vaccination 09/10/2019   Pneumococcal Conjugate-13 10/24/2013   Pneumococcal Polysaccharide-23 11/20/2008, 01/02/2014   Td 10/11/2004, 11/19/2014    TDAP status: Up to date  Flu Vaccine status: Up to date  Pneumococcal vaccine status: Up to date  Covid-19 vaccine status: Completed vaccines  Qualifies for Shingles Vaccine? Yes   Zostavax completed No   Shingrix Completed?: No.    Education has been provided regarding the importance of this vaccine. Patient has been advised to call insurance company to determine out of pocket expense if they have not yet received this vaccine. Advised may also receive vaccine at local pharmacy or Health Dept. Verbalized acceptance and understanding.  Screening Tests Health Maintenance  Topic Date Due   INFLUENZA VACCINE  03/09/2022   Zoster Vaccines- Shingrix (1 of 2) 05/15/2022 (Originally 11/30/1993)    TETANUS/TDAP  11/18/2024   Pneumonia Vaccine 68+ Years old  Completed   DEXA SCAN  Completed   Hepatitis C Screening  Completed   HPV VACCINES  Aged Out   COLONOSCOPY (Pts 45-44yr Insurance coverage will need to be confirmed)  Discontinued   COVID-19 Vaccine  Discontinued    Health Maintenance  Health Maintenance Due  Topic Date Due   INFLUENZA VACCINE  03/09/2022    Colorectal cancer screening: No longer required.   Mammogram status: Completed 05/2021. Repeat every year (COMPLETED AT OB/GYN OFFICE)  Bone Density status: Completed 12/09/2017. Results reflect: Bone density results: OSTEOPENIA. Repeat every 2-3 years.  Lung Cancer Screening: (Low Dose CT Chest recommended if Age 78-80years, 30 pack-year currently smoking OR have quit w/in 15years.) does not qualify.   Lung Cancer Screening Referral: NO  Additional Screening:  Hepatitis C Screening: does qualify; Completed 11/25/2015  Vision Screening: Recommended annual ophthalmology exams for early detection of glaucoma and other disorders of the eye. Is the patient up to date with their annual eye exam?  Yes  Who is the provider or what is the name of the office in which the patient attends annual eye exams? [facility] is not established with a provider, would they like to be referred to a provider to establish care? No .  Dental Screening: Recommended annual dental exams for proper oral hygiene  Community Resource Referral / Chronic Care Management: CRR required this visit?  No   CCM required this visit?  No      Plan:     I have personally reviewed and noted the following in the patient's chart:   Medical and social history Use of alcohol, tobacco or illicit drugs  Current medications and supplements including opioid prescriptions. Patient is not currently taking opioid prescriptions. Functional ability and status Nutritional status Physical activity Advanced directives List of other  physicians Hospitalizations, surgeries, and ER visits in previous 12 months Vitals Screenings to include cognitive, depression, and falls Referrals and appointments  In addition, I have reviewed and discussed with patient certain preventive protocols, quality metrics, and best practice recommendations. A written personalized care plan for preventive services as well as general preventive health recommendations were provided to patient.     Sheral Flow, LPN   01/20/4314   Nurse Notes: N/A

## 2022-09-15 ENCOUNTER — Other Ambulatory Visit: Payer: Self-pay | Admitting: Internal Medicine

## 2023-02-14 ENCOUNTER — Ambulatory Visit (INDEPENDENT_AMBULATORY_CARE_PROVIDER_SITE_OTHER): Payer: Medicare Other | Admitting: Internal Medicine

## 2023-02-14 ENCOUNTER — Encounter: Payer: Self-pay | Admitting: Internal Medicine

## 2023-02-14 VITALS — BP 130/76 | HR 80 | Temp 98.2°F | Ht 66.5 in | Wt 164.0 lb

## 2023-02-14 DIAGNOSIS — E538 Deficiency of other specified B group vitamins: Secondary | ICD-10-CM | POA: Diagnosis not present

## 2023-02-14 DIAGNOSIS — R739 Hyperglycemia, unspecified: Secondary | ICD-10-CM

## 2023-02-14 DIAGNOSIS — I350 Nonrheumatic aortic (valve) stenosis: Secondary | ICD-10-CM | POA: Diagnosis not present

## 2023-02-14 DIAGNOSIS — I10 Essential (primary) hypertension: Secondary | ICD-10-CM | POA: Diagnosis not present

## 2023-02-14 DIAGNOSIS — E7800 Pure hypercholesterolemia, unspecified: Secondary | ICD-10-CM | POA: Diagnosis not present

## 2023-02-14 DIAGNOSIS — E559 Vitamin D deficiency, unspecified: Secondary | ICD-10-CM | POA: Diagnosis not present

## 2023-02-14 LAB — BASIC METABOLIC PANEL
BUN: 16 mg/dL (ref 6–23)
CO2: 28 mEq/L (ref 19–32)
Calcium: 10.3 mg/dL (ref 8.4–10.5)
Chloride: 95 mEq/L — ABNORMAL LOW (ref 96–112)
Creatinine, Ser: 0.73 mg/dL (ref 0.40–1.20)
GFR: 78.34 mL/min (ref >60.00)
Glucose, Bld: 134 mg/dL — ABNORMAL HIGH (ref 70–99)
Potassium: 3.8 mEq/L (ref 3.5–5.1)
Sodium: 136 mEq/L (ref 135–145)

## 2023-02-14 LAB — LIPID PANEL
Cholesterol: 189 mg/dL (ref 0–200)
HDL: 78.1 mg/dL (ref >39.00)
LDL Cholesterol: 90 mg/dL (ref 0–99)
NonHDL: 110.92
Total CHOL/HDL Ratio: 2
Triglycerides: 103 mg/dL (ref 0.0–149.0)
VLDL: 20.6 mg/dL (ref 0.0–40.0)

## 2023-02-14 LAB — CBC WITH DIFFERENTIAL/PLATELET
Basophils Absolute: 0.1 10*3/uL (ref 0.0–0.1)
Basophils Relative: 0.8 % (ref 0.0–3.0)
Eosinophils Absolute: 0.2 10*3/uL (ref 0.0–0.7)
Eosinophils Relative: 2.5 % (ref 0.0–5.0)
HCT: 39.1 % (ref 36.0–46.0)
Hemoglobin: 13.4 g/dL (ref 12.0–15.0)
Lymphocytes Relative: 16.6 % (ref 12.0–46.0)
Lymphs Abs: 1.2 10*3/uL (ref 0.7–4.0)
MCHC: 34.2 g/dL (ref 30.0–36.0)
MCV: 88.1 fl (ref 78.0–100.0)
Monocytes Absolute: 0.6 10*3/uL (ref 0.1–1.0)
Monocytes Relative: 8.1 % (ref 3.0–12.0)
Neutro Abs: 5.3 10*3/uL (ref 1.4–7.7)
Neutrophils Relative %: 72 % (ref 43.0–77.0)
Platelets: 392 10*3/uL (ref 150.0–400.0)
RBC: 4.44 Mil/uL (ref 3.87–5.11)
RDW: 13.2 % (ref 11.5–15.5)
WBC: 7.4 10*3/uL (ref 4.0–10.5)

## 2023-02-14 LAB — HEPATIC FUNCTION PANEL
ALT: 26 U/L (ref 0–35)
AST: 19 U/L (ref 0–37)
Albumin: 4.8 g/dL (ref 3.5–5.2)
Alkaline Phosphatase: 106 U/L (ref 39–117)
Bilirubin, Direct: 0.1 mg/dL (ref 0.0–0.3)
Total Bilirubin: 0.8 mg/dL (ref 0.2–1.2)
Total Protein: 7.8 g/dL (ref 6.0–8.3)

## 2023-02-14 LAB — MICROALBUMIN / CREATININE URINE RATIO
Creatinine,U: 48.5 mg/dL
Microalb Creat Ratio: 1.4 mg/g (ref 0.0–30.0)
Microalb, Ur: <0.7 mg/dL (ref 0.0–1.9)

## 2023-02-14 LAB — TSH: TSH: 2.18 u[IU]/mL (ref 0.35–5.50)

## 2023-02-14 LAB — HEMOGLOBIN A1C: Hgb A1c MFr Bld: 6.1 % (ref 4.6–6.5)

## 2023-02-14 LAB — URINALYSIS, ROUTINE W REFLEX MICROSCOPIC
Bilirubin Urine: NEGATIVE
Hgb urine dipstick: NEGATIVE
Ketones, ur: NEGATIVE
Nitrite: NEGATIVE
Specific Gravity, Urine: <=1.005 — AB (ref 1.000–1.030)
Total Protein, Urine: NEGATIVE
Urine Glucose: NEGATIVE
Urobilinogen, UA: 0.2 (ref 0.0–1.0)
pH: 7 (ref 5.0–8.0)

## 2023-02-14 LAB — VITAMIN D 25 HYDROXY (VIT D DEFICIENCY, FRACTURES): VITD: 39.18 ng/mL (ref 30.00–100.00)

## 2023-02-14 LAB — VITAMIN B12: Vitamin B-12: 359 pg/mL (ref 211–911)

## 2023-02-14 MED ORDER — AMLODIPINE BESYLATE 10 MG PO TABS: 10.0000 mg | ORAL_TABLET | Freq: Every day | ORAL | 3 refills | Status: DC

## 2023-02-14 MED ORDER — AMITRIPTYLINE HCL 50 MG PO TABS: 50.0000 mg | ORAL_TABLET | Freq: Every day | ORAL | 1 refills | Status: DC

## 2023-02-14 MED ORDER — ATORVASTATIN CALCIUM 20 MG PO TABS
20.0000 mg | ORAL_TABLET | Freq: Every day | ORAL | 3 refills | Status: DC
Start: 2023-02-14 — End: 2024-02-15

## 2023-02-14 MED ORDER — POTASSIUM CHLORIDE ER 10 MEQ PO TBCR
EXTENDED_RELEASE_TABLET | ORAL | 3 refills | Status: DC
Start: 2023-02-14 — End: 2024-02-15

## 2023-02-14 MED ORDER — TELMISARTAN-HCTZ 80-25 MG PO TABS
1.0000 | ORAL_TABLET | Freq: Every day | ORAL | 3 refills | Status: DC
Start: 2023-02-14 — End: 2024-02-15

## 2023-02-14 NOTE — Progress Notes (Signed)
The test results show that your current treatment is OK, as the tests are stable.  Please continue the same plan.  There is no other need for change of treatment or further evaluation based on these results, at this time.  thanks 

## 2023-02-14 NOTE — Assessment & Plan Note (Signed)
Asympt but worsening murmur - for echo, and consider cardiology referral if worsening

## 2023-02-14 NOTE — Progress Notes (Unsigned)
Patient ID: Julia Sanchez, female   DOB: [birth year], 77 y.o.   MRN: [id number]        Chief Complaint: follow up HTN, HLD and hyperglycemia , AS, low vit d       HPI:  Julia Sanchez is a 77 y.o. female here overall doin ok,  Pt denies chest pain, increased sob or doe, wheezing, orthopnea, PND, increased LE swelling, palpitations, dizziness or syncope.   Pt denies polydipsia, polyuria, or new focal neuro s/s.    Pt denies fever, wt loss, night sweats, loss of appetite, or other constitutional symptoms   Son died 1 yr ago in car accident with MI.  Husband 3 yrs ago.         Wt Readings from Last 3 Encounters:  02/14/23 164 lb (74.4 kg)  02/12/22 166 lb (75.3 kg)  01/22/21 157 lb 6.4 oz (71.4 kg)   BP Readings from Last 3 Encounters:  02/14/23 130/76  02/12/22 138/60  01/22/21 (!) 162/86         Past Medical History:  Diagnosis Date   Anxiety    due to stress - no stress   DIVERTICULOSIS, COLON 04/07/2009   HYPERLIPIDEMIA 05/03/2007   HYPERTENSION 05/03/2007   SVD (spontaneous vaginal delivery)    x 2   Past Surgical History:  Procedure Laterality Date   COLONOSCOPY     CYSTOSCOPY     EYE SURGERY  05/2012   macular hole - left   EYE SURGERY  2014   cataract removed left eye   HYSTEROSCOPY WITH D & C  03/06/2013   Procedure: DILATATION AND CURETTAGE /HYSTEROSCOPY;  Surgeon: Meriel Pica, MD;  Location: [facility];  Service: Gynecology;;   LAPAROSCOPIC ASSISTED VAGINAL HYSTERECTOMY N/A 06/27/2014   Procedure: LAPAROSCOPIC ASSISTED VAGINAL HYSTERECTOMY WITH BILATERAL SALPINGO OOPHORECTOMY;  Surgeon: Meriel Pica, MD;  Location: [facility];  Service: Gynecology;  Laterality: N/A;   SALPINGOOPHORECTOMY Bilateral 06/27/2014   Procedure: SALPINGO OOPHORECTOMY;  Surgeon: Meriel Pica, MD;  Location: [facility];  Service: Gynecology;  Laterality: Bilateral;   TONSILLECTOMY     WISDOM TOOTH EXTRACTION      reports that she has never smoked. She has never used smokeless tobacco.  She reports current alcohol use. She reports that she does not use drugs. family history includes Breast cancer in her maternal aunt; Coronary artery disease in her father; Diabetes in her cousin and maternal uncle; Heart disease in her father. Allergies  Allergen Reactions   Nitrofurantoin Nausea And Vomiting   Sulfonamide Derivatives Nausea And Vomiting   Current Outpatient Medications on File Prior to Visit  Medication Sig Dispense Refill   aspirin EC 81 MG tablet Take 1 tablet (81 mg total) by mouth daily. 90 tablet 11   diclofenac sodium (VOLTAREN) 1 % GEL Apply 4 g topically 4 (four) times daily as needed. 400 g 5   ibuprofen (ADVIL) 800 MG tablet 1-2 tab by mouth at bedtime as needed for pain 60 tablet 2   No current facility-administered medications on file prior to visit.        ROS:  All others reviewed and negative.  Objective        PE:  BP 130/76 (BP Location: Right Arm, Patient Position: Sitting, Cuff Size: Normal)   Pulse 80   Temp 98.2 F (36.8 C) (Oral)   Ht 5' 6.5" (1.689 m)   Wt 164 lb (74.4 kg)   SpO2 98%   BMI 26.07 kg/m  Constitutional: Pt appears in NAD               HENT: Head: NCAT.                Right Ear: External ear normal.                 Left Ear: External ear normal.                Eyes: . Pupils are equal, round, and reactive to light. Conjunctivae and EOM are normal               Nose: without d/c or deformity               Neck: Neck supple. Gross normal ROM               Cardiovascular: Normal rate and regular rhythm.  With loud sys murmur rusb               Pulmonary/Chest: Effort normal and breath sounds without rales or wheezing.                Abd:  Soft, NT, ND, + BS, no organomegaly               Neurological: Pt is alert. At baseline orientation, motor grossly intact               Skin: Skin is warm. No rashes, no other new lesions, LE edema - none               Psychiatric: Pt behavior is normal without agitation    Micro: none  Cardiac tracings I have personally interpreted today:  none  Pertinent Radiological findings (summarize): none   Lab Results  Component Value Date   WBC 7.4 02/14/2023   HGB 13.4 02/14/2023   HCT 39.1 02/14/2023   PLT 392.0 02/14/2023   GLUCOSE 134 (H) 02/14/2023   CHOL 189 02/14/2023   TRIG 103.0 02/14/2023   HDL 78.10 02/14/2023   LDLDIRECT 128.0 02/17/2010   LDLCALC 90 02/14/2023   ALT 26 02/14/2023   AST 19 02/14/2023   NA 136 02/14/2023   K 3.8 02/14/2023   CL 95 (L) 02/14/2023   CREATININE 0.73 02/14/2023   BUN 16 02/14/2023   CO2 28 02/14/2023   TSH 2.18 02/14/2023   HGBA1C 6.1 02/14/2023   MICROALBUR <0.7 02/14/2023   Assessment/Plan:  Julia Sanchez is a 77 y.o. White or Caucasian [1] female with  has a past medical history of Anxiety, DIVERTICULOSIS, COLON (04/07/2009), HYPERLIPIDEMIA (05/03/2007), HYPERTENSION (05/03/2007), and SVD (spontaneous vaginal delivery).  Mild aortic stenosis Asympt but worsening murmur - for echo, and consider cardiology referral if worsening  Hyperglycemia Lab Results  Component Value Date   HGBA1C 6.1 02/14/2023   Stable, pt to continue current medical treatment  - diet, wt control   Hyperlipidemia Lab Results  Component Value Date   LDLCALC 90 02/14/2023   Stable, pt to continue current statin lipitor 20 qd   Hypertensive disorder BP Readings from Last 3 Encounters:  02/14/23 130/76  02/12/22 138/60  01/22/21 (!) 162/86   Stable, pt to continue medical treatment norvasc 10 every day, micardis hct 80 25 every day,    Vitamin D deficiency Last vitamin D Lab Results  Component Value Date   VD25OH 39.18 02/14/2023   Low, to start oral replacement  Followup: Return in about 1 year (around 02/14/2024).  Oliver Barre, MD 02/15/2023  9:27 PM [facility] - [facility]

## 2023-02-14 NOTE — Patient Instructions (Addendum)
Please have your Shingrix (shingles) shots done at your local pharmacy.  Please continue all other medications as before, and refills have been done if requested.  Please have the pharmacy call with any other refills you may need.  Please continue your efforts at being more active, low cholesterol diet, and weight control.  You are otherwise up to date with prevention measures today.  Please keep your appointments with your specialists as you may have planned  You will be contacted regarding the referral for: Echocardiogram  Please go to the LAB at the blood drawing area for the tests to be done  You will be contacted by phone if any changes need to be made immediately.  Otherwise, you will receive a letter about your results with an explanation, but please check with MyChart first.  Please remember to sign up for MyChart if you have not done so, as this will be important to you in the future with finding out test results, communicating by private email, and scheduling acute appointments online when needed.  Please make an Appointment to return for your 1 year visit, or sooner if needed

## 2023-02-15 ENCOUNTER — Encounter: Payer: Self-pay | Admitting: Internal Medicine

## 2023-02-15 NOTE — Assessment & Plan Note (Signed)
BP Readings from Last 3 Encounters:  02/14/23 130/76  02/12/22 138/60  01/22/21 (!) 162/86   Stable, pt to continue medical treatment norvasc 10 every day, micardis hct 80 25 every day,

## 2023-02-15 NOTE — Assessment & Plan Note (Signed)
Last vitamin D Lab Results  Component Value Date   VD25OH 39.18 02/14/2023   Low, to start oral replacement

## 2023-02-15 NOTE — Assessment & Plan Note (Signed)
Lab Results  Component Value Date   LDLCALC 90 02/14/2023   Stable, pt to continue current statin lipitor 20 qd

## 2023-02-15 NOTE — Assessment & Plan Note (Signed)
Lab Results  Component Value Date   HGBA1C 6.1 02/14/2023   Stable, pt to continue current medical treatment - diet, wt control  

## 2023-02-28 ENCOUNTER — Ambulatory Visit (HOSPITAL_COMMUNITY): Admission: RE | Admit: 2023-02-28 | Payer: Medicare Other | Source: Ambulatory Visit

## 2023-02-28 DIAGNOSIS — I083 Combined rheumatic disorders of mitral, aortic and tricuspid valves: Secondary | ICD-10-CM | POA: Insufficient documentation

## 2023-02-28 DIAGNOSIS — E785 Hyperlipidemia, unspecified: Secondary | ICD-10-CM | POA: Diagnosis not present

## 2023-02-28 DIAGNOSIS — I350 Nonrheumatic aortic (valve) stenosis: Secondary | ICD-10-CM | POA: Diagnosis present

## 2023-02-28 DIAGNOSIS — I10 Essential (primary) hypertension: Secondary | ICD-10-CM | POA: Diagnosis not present

## 2023-02-28 LAB — ECHOCARDIOGRAM COMPLETE
AR max vel: 1.23 cm2
AV Area VTI: 1.33 cm2
AV Area mean vel: 1.28 cm2
AV Mean grad: 16.3 mmHg
AV Peak grad: 29.4 mmHg
Ao pk vel: 2.71 m/s
Area-P 1/2: 2.92 cm2
Calc EF: 65.8 %
MV M vel: 5.66 m/s
MV Peak grad: 128.1 mmHg
MV VTI: 2.68 cm2
S' Lateral: 2.9 cm
Single Plane A2C EF: 65.2 %
Single Plane A4C EF: 66.3 %

## 2023-03-02 ENCOUNTER — Telehealth: Payer: Self-pay | Admitting: Internal Medicine

## 2023-03-02 NOTE — Telephone Encounter (Signed)
Pt called wanting a phone call back wanting call to go over her test results for her echocardiogram. Please advise.

## 2023-03-03 NOTE — Telephone Encounter (Signed)
Echocardiogram rpt under procedure.Marland KitchenRaechel Chute

## 2023-03-03 NOTE — Telephone Encounter (Signed)
Same answer as already left on mychart -   The test results show that your current treatment is OK, as the echo shows mild aortic stenosis only.  There is no worsening valve issue, and the heart function is overall normal..  Please continue the same plan.   There is no other need for change of treatment or further evaluation based on these results, at this time.  thanks

## 2023-03-04 NOTE — Telephone Encounter (Signed)
Called pt no answer LMOM RTC.../lmb 

## 2023-03-04 NOTE — Telephone Encounter (Signed)
Pt return call back gave her MD response. Pt states she told [facility] she no longer have mychart. Inform her I will delete services.Marland KitchenRaechel Chute

## 2023-07-19 ENCOUNTER — Ambulatory Visit (INDEPENDENT_AMBULATORY_CARE_PROVIDER_SITE_OTHER): Payer: Medicare Other | Admitting: Obstetrics

## 2023-07-19 ENCOUNTER — Encounter: Payer: Self-pay | Admitting: Obstetrics

## 2023-07-19 VITALS — BP 173/80 | HR 83 | Ht 64.5 in | Wt 170.0 lb

## 2023-07-19 DIAGNOSIS — R350 Frequency of micturition: Secondary | ICD-10-CM

## 2023-07-19 DIAGNOSIS — N8110 Cystocele, unspecified: Secondary | ICD-10-CM | POA: Diagnosis not present

## 2023-07-19 DIAGNOSIS — N898 Other specified noninflammatory disorders of vagina: Secondary | ICD-10-CM | POA: Diagnosis not present

## 2023-07-19 DIAGNOSIS — I10 Essential (primary) hypertension: Secondary | ICD-10-CM

## 2023-07-19 LAB — POCT URINALYSIS DIPSTICK
Bilirubin, UA: NEGATIVE
Blood, UA: NEGATIVE
Glucose, UA: NEGATIVE
Ketones, UA: NEGATIVE
Leukocytes, UA: NEGATIVE
Nitrite, UA: NEGATIVE
Protein, UA: NEGATIVE
Spec Grav, UA: <=1.005 — AB (ref 1.010–1.025)
Urobilinogen, UA: 0.2 U/dL
pH, UA: 7 (ref 5.0–8.0)

## 2023-07-19 NOTE — Assessment & Plan Note (Addendum)
-   most bothersome symptom - For treatment of pelvic organ prolapse, we discussed options for management including expectant management, conservative management, and surgical management, such as Kegels, a pessary, pelvic floor physical therapy, and specific surgical procedures. - not sexually active, denies desire for future penetrative intercourse - encouraged splinting to ensure complete bladder emptying - We discussed two options for prolapse repair:  1) vaginal repair without mesh - Pros - safer, no mesh complications - Cons - not as strong as mesh repair, higher risk of recurrence  2) laparoscopic repair with mesh - Pros - stronger, better long-term success - Cons - risks of mesh implant (erosion into vagina or bladder, adhering to the rectum, pain) - these risks are lower than with a vaginal mesh but still exist  3) Vaginal closure procedure - pros - strong, good long-term success - cons - unable to have penetrative intercourse - discussed need to evaluate vaginal lesion prior to obliterative procedure due to inability to evaluate vaginal apex after surgery.

## 2023-07-19 NOTE — Assessment & Plan Note (Addendum)
-   POCT UA neg, bladder scan 51mL - We discussed the symptoms of overactive bladder (OAB), which include urinary urgency, urinary frequency, nocturia, with or without urge incontinence.  While we do not know the exact etiology of OAB, several treatment options exist. We discussed management including behavioral therapy (decreasing bladder irritants, urge suppression strategies, timed voids, bladder retraining), physical therapy, medication; for refractory cases posterior tibial nerve stimulation, sacral neuromodulation, and intravesical botulinum toxin injection.  For anticholinergic medications, we discussed the potential side effects of anticholinergics including dry eyes, dry mouth, constipation, cognitive impairment and urinary retention. For Beta-3 agonist medication, we discussed the potential side effect of elevated blood pressure which is more likely to occur in individuals with uncontrolled hypertension. - encouraged fluid management, Kegel exercises, and caffeine reduction - discussed need for bladder testing to rule out occult stress urinary leakage, advised pt to return with full bladder

## 2023-07-19 NOTE — Assessment & Plan Note (Signed)
-   BP 150-170/70-80s - denies chest pain, SOB, headache or change in vision - encouraged pt to continue anti-HTN medication and follow-up with PCP

## 2023-07-19 NOTE — Patient Instructions (Addendum)
You have a stage 2 (out of 4) prolapse.  We discussed the fact that it is not life threatening but there are several treatment options. For treatment of pelvic organ prolapse, we discussed options for management including expectant management, conservative management, and surgical management, such as Kegels, a pessary, pelvic floor physical therapy, and specific surgical procedures.     For vaginal atrophy (thinning of the vaginal tissue that can cause dryness and burning) and UTI prevention we discussed estrogen replacement in the form of vaginal cream.   Start vaginal estrogen therapy nightly for two weeks then 2 times weekly at night. This can be placed with your finger or an applicator inside the vagina and around the urethra.  Please let us know if the prescription is too expensive and we can look for alternative options.   Is vaginal estrogen therapy safe for me? Vaginal estrogen preparations act on the vaginal skin, and only a very tiny amount is absorbed into the bloodstream (0.01%).  They work in a similar way to hand or face cream.  There is minimal absorption and they are therefore perfectly safe. If you have had breast cancer and have persistent troublesome symptoms which aren't settling with vaginal moisturisers and lubricants, local estrogen treatment may be a possibility, but consultation with your oncologist should take place first.   We discussed the symptoms of overactive bladder (OAB), which include urinary urgency, urinary frequency, night-time urination, with or without urge incontinence.  We discussed management including behavioral therapy (decreasing bladder irritants by following a bladder diet, urge suppression strategies, timed voids, bladder retraining), physical therapy, medication; and for refractory cases posterior tibial nerve stimulation, sacral neuromodulation, and intravesical botulinum toxin injection.

## 2023-07-19 NOTE — Progress Notes (Signed)
New Patient Evaluation and Consultation  Referring Provider: Richarda Overlie, MD PCP: Corwin Levins, MD Date of Service: 07/19/2023  SUBJECTIVE Chief Complaint: New Patient (Initial Visit) Julia Sanchez is a 77 y.o. female here for a consult for prolapse)  History of Present Illness: Julia Sanchez is a 77 y.o. White or Caucasian female seen in consultation at the request of Dr Marcelle Overlie for evaluation of pelvic organ prolapse.    Vaginal bulge at the vaginal opening after moving things from her basement with sensation of incomplete bladder emptying. Denies discharge or bleeding S/p LAVH with BSO for abnormal pap smears by Dr. Marcelle Overlie 06/27/14, EUA with D&C and ectocervical biopsy 03/06/13, LEEP/cone biopsy 01/09/04 Last seen by Dr. 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 Recommended yearly surveillance with Dr. Marcelle Overlie. Recommended vaginal laser if VAIN II-III LGSIL 10/2017 01/09/19 HGSIL, VAIN II/III with no HR HPV Vaginal cuff biopsy with VIN II-III 01/29/19 04/27/19 ASCUS pap Denies current use vaginal estrogen  Review of records significant for: Mild aortic stenosis, peripheral neuropathy, VAIN III, HTN  Urinary Symptoms: Sensation of urgency and concern for urine leakage with cough/ sneeze, denies leakage  Day time voids 9-10.  Nocturia: 1-2 times per night to void with amitriptyline for RLS Voiding dysfunction:  does not empty bladder well.  Patient does not use a catheter to empty bladder.  When urinating, patient feels difficulty starting urine stream Drinks: 48oz water per day, 2 cups of coffee  UTIs:  0  UTI's in the last year.   Denies history of blood in urine, kidney or bladder stones, pyelonephritis, bladder cancer, and kidney cancer No results found for the last 90 days.   Pelvic Organ Prolapse Symptoms:                  Patient Admits to a feeling of a bulge the vaginal area. It has been present for 3 months.  Patient Admits to seeing a bulge.   This bulge is bothersome.  Bowel Symptom: Bowel movements: 1 time(s) per day with history of diverticulosis and repositions for bowel movements Stool consistency: soft  Straining: yes.  Splinting: no.  Incomplete evacuation: no.  Patient Denies accidental bowel leakage / fecal incontinence Bowel regimen: fiber metamucil Last colonoscopy: denies need to continue due to age [facility] Colonoscopy          Discontinued - Colonoscopy  Discontinued      Frequency changed to Never automatically (Topic No Longer Applies)   06/23/2009  [facility] COLONOSCOPY   Only the first 1 history entries have been loaded, but more history exists.           Sexual Function Sexually active: no.  Sexual orientation: Straight Pain with sex: No  Pelvic Pain Denies pelvic pain   Past Medical History:  Past Medical History:  Diagnosis Date   Anxiety    due to stress - no stress   DIVERTICULOSIS, COLON 04/07/2009   HYPERLIPIDEMIA 05/03/2007   HYPERTENSION 05/03/2007   SVD (spontaneous vaginal delivery)    x 2     Past Surgical History:   Past Surgical History:  Procedure Laterality Date   COLONOSCOPY     CYSTOSCOPY     EYE SURGERY  05/2012   macular hole - left   EYE SURGERY  2014   cataract removed left eye   HYSTEROSCOPY WITH D & C  03/06/2013   Procedure: DILATATION AND CURETTAGE /HYSTEROSCOPY;  Surgeon: Meriel Pica, MD;  Location: [facility]  ORS;  Service: Gynecology;;   LAPAROSCOPIC ASSISTED VAGINAL HYSTERECTOMY N/A 06/27/2014   Procedure: LAPAROSCOPIC ASSISTED VAGINAL HYSTERECTOMY WITH BILATERAL SALPINGO OOPHORECTOMY;  Surgeon: Meriel Pica, MD;  Location: [facility] ORS;  Service: Gynecology;  Laterality: N/A;   SALPINGOOPHORECTOMY Bilateral 06/27/2014   Procedure: SALPINGO OOPHORECTOMY;  Surgeon: Meriel Pica, MD;  Location: [facility] ORS;  Service: Gynecology;  Laterality: Bilateral;   TONSILLECTOMY     WISDOM TOOTH EXTRACTION       Past OB/GYN History: OB History  Gravida Para Term Preterm  AB Living  2 2 2     1   SAB IAB Ectopic Multiple Live Births          2    # Outcome Date GA Lbr Len/2nd Weight Sex Type Anes PTL Lv  2 Term     F Vag-Spont     1 Term     M Vag-Spont       Vaginal deliveries: 2, largest infant 7lb 6oz Forceps/ Vacuum deliveries: 1 with 36 weeker, Cesarean section: 0 Menopausal: Yes, at age 49, Denies vaginal bleeding since menopause Contraception: n/1. Last pap smear.  Any history of abnormal pap smears: yes with history of VAIN     Component Value Date/Time   DIAGPAP (A) 04/27/2019 0000    - Atypical squamous cells of undetermined significance (ASC-US)   HPVHIGH Negative 04/27/2019 0000   ADEQPAP Satisfactory for evaluation. 04/27/2019 0000    Medications: Patient has a current medication list which includes the following prescription(s): amitriptyline, amlodipine, aspirin ec, atorvastatin, diclofenac sodium, ibuprofen, potassium chloride, and telmisartan-hydrochlorothiazide.   Allergies: Patient is allergic to nitrofurantoin and sulfonamide derivatives.   Social History:  Social History   Tobacco Use   Smoking status: Never   Smokeless tobacco: Never  Vaping Use   Vaping status: Never Used  Substance Use Topics   Alcohol use: Yes    Comment: occassional   Drug use: No    Relationship status: widowed Patient lives by herself.   Patient is not employed, retired. Regular exercise: No History of abuse: No  Family History:   Family History  Problem Relation Age of Onset   Heart failure Mother    Heart disease Father        CAD/MI- fatal   Coronary artery disease Father        Hx MI   Breast cancer Maternal Aunt    Diabetes Maternal Uncle    Diabetes Cousin      Review of Systems: Review of Systems  Constitutional:  Negative for fever, malaise/fatigue and weight loss.  Respiratory:  Negative for cough, shortness of breath and wheezing.   Cardiovascular:  Negative for chest pain, palpitations and leg swelling.   Gastrointestinal:  Negative for abdominal pain and blood in stool.  Genitourinary:  Positive for frequency. Negative for dysuria, hematuria and urgency.       Bulge, leakage  Skin:  Negative for rash.  Neurological:  Negative for dizziness, weakness and headaches.  Endo/Heme/Allergies:  Does not bruise/bleed easily.  Psychiatric/Behavioral:  Negative for depression. The patient is not nervous/anxious.      OBJECTIVE Physical Exam: Vitals:   07/19/23 0847 07/19/23 1316  BP: (!) 155/76 (!) 173/80  Pulse: 82 83  Weight: 170 lb (77.1 kg)   Height: 5' 4.5" (1.638 m)     Physical Exam Constitutional:      General: She is not in acute distress.    Appearance: Normal appearance.  Genitourinary:     Bladder and  urethral meatus normal.     There are lesions in the vagina.     Right Labia: No rash, tenderness, lesions, skin changes or Bartholin's cyst.    Left Labia: No tenderness, lesions, skin changes, Bartholin's cyst or rash.    No vaginal discharge, erythema, tenderness, bleeding, ulceration or granulation tissue.     Anterior, posterior and apical vaginal prolapse present.    Moderate vaginal atrophy present.     Right Adnexa: not tender, not full and no mass present.    Left Adnexa: not tender, not full and no mass present.    Cervix is absent.        Uterus is absent.     Urethral meatus caruncle not present.    No urethral prolapse, tenderness, mass, hypermobility, discharge or stress urinary incontinence with cough stress test present.     Bladder is not tender, urgency on palpation not present and masses not present.      Levator ani not tender, obturator internus not tender, no asymmetrical contractions present and no pelvic spasms present.    Anal wink present and BC reflex present. Cardiovascular:     Rate and Rhythm: Normal rate.  Pulmonary:     Effort: Pulmonary effort is normal. No respiratory distress.  Abdominal:     General: Abdomen is flat. There is no  distension.     Palpations: Abdomen is soft. There is no mass.     Tenderness: There is no abdominal tenderness.     Hernia: No hernia is present.    Neurological:     Mental Status: She is alert.  Vitals reviewed. Exam conducted with a chaperone present.    POP-Q:   POP-Q  1                                            Aa   1                                           Ba  0                                              C   3                                            Gh  3                                            Pb  6                                            tvl   1  Ap  1                                            Bp                                                 D   Post-Void Residual (PVR) by Bladder Scan: In order to evaluate bladder emptying, we discussed obtaining a postvoid residual and patient agreed to this procedure.  Procedure: The ultrasound unit was placed on the patient's abdomen in the suprapubic region after the patient had voided.    Post Void Residual - 07/19/23 0947       Post Void Residual   Post Void Residual 51 mL              Laboratory Results: Lab Results  Component Value Date   COLORU yellow 07/19/2023   CLARITYU clear 07/19/2023   GLUCOSEUR Negative 07/19/2023   BILIRUBINUR negative 07/19/2023   KETONESU negative 07/19/2023   SPECGRAV <=1.005 (A) 07/19/2023   RBCUR negative 07/19/2023   PHUR 7.0 07/19/2023   PROTEINUR Negative 07/19/2023   UROBILINOGEN 0.2 07/19/2023   LEUKOCYTESUR Negative 07/19/2023    Lab Results  Component Value Date   CREATININE 0.73 02/14/2023   CREATININE 0.72 02/12/2022   CREATININE 0.70 01/22/2021    Lab Results  Component Value Date   HGBA1C 6.1 02/14/2023    Lab Results  Component Value Date   HGB 13.4 02/14/2023    ASSESSMENT AND PLAN Ms. Ferraiolo is a 77 y.o. with:  1. Urinary frequency   2. Pelvic organ prolapse quantification  stage 2 cystocele   3. Vaginal lesion   4. Primary hypertension     Urinary frequency Assessment & Plan: - POCT UA neg, bladder scan 51mL - We discussed the symptoms of overactive bladder (OAB), which include urinary urgency, urinary frequency, nocturia, with or without urge incontinence.  While we do not know the exact etiology of OAB, several treatment options exist. We discussed management including behavioral therapy (decreasing bladder irritants, urge suppression strategies, timed voids, bladder retraining), physical therapy, medication; for refractory cases posterior tibial nerve stimulation, sacral neuromodulation, and intravesical botulinum toxin injection.  For anticholinergic medications, we discussed the potential side effects of anticholinergics including dry eyes, dry mouth, constipation, cognitive impairment and urinary retention. For Beta-3 agonist medication, we discussed the potential side effect of elevated blood pressure which is more likely to occur in individuals with uncontrolled hypertension. - encouraged fluid management, Kegel exercises, and caffeine reduction - discussed need for bladder testing to rule out occult stress urinary leakage, advised pt to return with full bladder  Orders: -     POCT urinalysis dipstick  Pelvic organ prolapse quantification stage 2 cystocele Assessment & Plan: - most bothersome symptom - For treatment of pelvic organ prolapse, we discussed options for management including expectant management, conservative management, and surgical management, such as Kegels, a pessary, pelvic floor physical therapy, and specific surgical procedures. - not sexually active, denies desire for future penetrative intercourse - encouraged splinting to ensure complete bladder emptying - We discussed two options for prolapse repair:  1) vaginal repair without mesh - Pros - safer, no mesh complications - Cons - not as strong as  mesh repair, higher risk of  recurrence  2) laparoscopic repair with mesh - Pros - stronger, better long-term success - Cons - risks of mesh implant (erosion into vagina or bladder, adhering to the rectum, pain) - these risks are lower than with a vaginal mesh but still exist  3) Vaginal closure procedure - pros - strong, good long-term success - cons - unable to have penetrative intercourse - discussed need to evaluate vaginal lesion prior to obliterative procedure due to inability to evaluate vaginal apex after surgery.   Vaginal lesion Assessment & Plan: - 01/09/19 HGSIL, VAIN II/III with no HR HPV - Vaginal cuff biopsy with VIN II-III 01/29/19 s/p 5-FU by Dr. Andrey Farmer and recommended yearly surveillance - 04/27/19 ASCUS pap - 2-40mm white lesion noted right of midline at vaginal apex - pt to return for vaginal biopsy - will refer back to Dr. Andrey Farmer if VIN II-III   Primary hypertension Assessment & Plan: - BP 150-170/70-80s - denies chest pain, SOB, headache or change in vision - encouraged pt to continue anti-HTN medication and follow-up with PCP   Time spent: I spent 71 minutes dedicated to the care of this patient on the date of this encounter to include pre-visit review of records, face-to-face time with the patient discussing pelvic organ prolapse, vaginal lesion with a history of VAIN III, primary HTN, urinary frequency, and post visit documentation and ordering medication/ testing.   Loleta Chance, MD

## 2023-07-19 NOTE — Assessment & Plan Note (Addendum)
-   01/09/19 HGSIL, VAIN II/III with no HR HPV - Vaginal cuff biopsy with VIN II-III 01/29/19 s/p 5-FU by Dr. Andrey Farmer and recommended yearly surveillance - 04/27/19 ASCUS pap - 2-58mm white lesion noted right of midline at vaginal apex - pt to return for vaginal biopsy - will refer back to Dr. Andrey Farmer if VIN II-III

## 2023-07-20 ENCOUNTER — Telehealth: Payer: Self-pay | Admitting: Obstetrics

## 2023-07-20 MED ORDER — ESTRADIOL 0.1 MG/GM VA CREA: 0.5000 g | TOPICAL_CREAM | VAGINAL | 11 refills | Status: DC

## 2023-07-20 NOTE — Telephone Encounter (Signed)
Patient called in today saying her prescription for Estrogen Vaginal Cream was not called in yesterday.  Would like it sent to [facility] [address].

## 2023-08-18 ENCOUNTER — Ambulatory Visit (INDEPENDENT_AMBULATORY_CARE_PROVIDER_SITE_OTHER): Payer: Medicare Other | Admitting: Obstetrics

## 2023-08-18 ENCOUNTER — Other Ambulatory Visit (HOSPITAL_COMMUNITY)
Admission: RE | Admit: 2023-08-18 | Discharge: 2023-08-18 | Disposition: A | Discharge status: Home / Self Care | Payer: Medicare Other | Source: Ambulatory Visit | Attending: Obstetrics | Admitting: Obstetrics

## 2023-08-18 ENCOUNTER — Encounter: Payer: Self-pay | Admitting: Obstetrics

## 2023-08-18 VITALS — BP 147/77 | HR 88

## 2023-08-18 DIAGNOSIS — N393 Stress incontinence (female) (male): Secondary | ICD-10-CM | POA: Diagnosis not present

## 2023-08-18 DIAGNOSIS — N8110 Cystocele, unspecified: Secondary | ICD-10-CM | POA: Diagnosis not present

## 2023-08-18 DIAGNOSIS — N898 Other specified noninflammatory disorders of vagina: Secondary | ICD-10-CM | POA: Insufficient documentation

## 2023-08-18 DIAGNOSIS — R350 Frequency of micturition: Secondary | ICD-10-CM | POA: Diagnosis not present

## 2023-08-18 MED ORDER — PHENAZOPYRIDINE HCL 200 MG PO TABS: 200.0000 mg | ORAL_TABLET | Freq: Three times a day (TID) | ORAL | 0 refills | Status: AC | PRN

## 2023-08-18 NOTE — Patient Instructions (Addendum)
 Continue vaginal estrogen twice a week.   We will contact you with your surgery scheduling. We reviewed partial colpectomy (colpocleisis), anterior/posterior repair, perineorrhaphy, periurethral bulking and cystoscopy.   Taking Care of Yourself after Bladder testing   Drink plenty of water  for a day or two following your procedure. Try to have about 8 ounces (one cup) at a time, and do this 6 times or more per day unless you have fluid restrictitons AVOID irritative beverages such as coffee, tea, soda, alcoholic or citrus drinks for a day or two, as this may cause burning with urination.  For the first 1-2 days after the procedure, your urine may be pink or red in color. You may have some blood in your urine as a normal side effect of the procedure. Large amounts of bleeding or difficulty urinating are NOT normal. Call the nurse line if this happens or go to the nearest Emergency Room if the bleeding is heavy or you cannot urinate at all and it is after hours. If you had a Bulkamid injection in the urethra and need to be catheterized, ask for a pediatric catheter to be used (size 10 or 12-French) so the material is not pushed out of place.   You may experience some discomfort or a burning sensation with urination after having this procedure. You can use over the counter Azo or pyridium  to help with burning and follow the instructions on the packaging. If it does not improve within 1-2 days, or other symptoms appear (fever, chills, or difficulty urinating) call the office to speak to a nurse.  You may return to normal daily activities such as work, school, driving, exercising and housework on the day of the procedure. If your doctor gave you a prescription, take it as ordered.    We discussed urethral bulking (Bulkamid). We discussed success rate of approximately 70-80% and possible need for second injection. We reviewed that this is not a permanent procedure and the Bulkamid does dissolve over time.  Risks reviewed including injury to bladder or urethra, UTI, urinary retention and hematuria.

## 2023-08-18 NOTE — Progress Notes (Signed)
 [facility] Return Visit  SUBJECTIVE  History of Present Illness: Julia Sanchez is a 78 y.o. female seen in follow-up for vaginal lesion with history of VAIN II-III s/p Efudex . Plan at last visit was vaginal biopsy.   Most recent vaginal biopsy 05/28/19 with VAIN I Reports rare leakage with sneezing only Reports [facility] today with home collection at home, denies leakage with sneeze  Past Medical History: Patient  has a past medical history of Anxiety, DIVERTICULOSIS, COLON (04/07/2009), HYPERLIPIDEMIA (05/03/2007), HYPERTENSION (05/03/2007), and SVD (spontaneous vaginal delivery).   Past Surgical History: She  has a past surgical history that includes Cystoscopy; Hysteroscopy with D & C (03/06/2013); Tonsillectomy; Wisdom tooth extraction; Colonoscopy; Eye surgery (05/2012); Eye surgery (2014); Laparoscopic assisted vaginal hysterectomy (N/A, 06/27/2014); and Salpingoophorectomy (Bilateral, 06/27/2014).   Medications: She has a current medication list which includes the following prescription(s): amitriptyline , amlodipine , aspirin  ec, atorvastatin , diclofenac  sodium, estradiol , ibuprofen , phenazopyridine , potassium chloride , and telmisartan -hydrochlorothiazide .   Allergies: Patient is allergic to nitrofurantoin and sulfonamide derivatives.   Social History: Patient  reports that she has never smoked. She has never used smokeless tobacco. She reports current alcohol use. She reports that she does not use drugs.     OBJECTIVE     Physical Exam: Vitals:   08/18/23 1428  BP: (!) 147/77  Pulse: 88   Gen: No apparent distress, A&O x 3.  Detailed Urogynecologic Evaluation:  Deferred. Prior exam showed:  Straight Catheterization Procedure for PVR: After verbal consent was obtained from the patient for catheterization to assess bladder emptying and residual volume the urethra and surrounding tissues were prepped with betadine and an in and out catheterization was  performed.  PVR was 50mL.  Urine appeared clear yellow. The patient tolerated the procedure well.  Verbal consent was obtained to perform simple CMG procedure:   Prolapse was reduced using 2 large cotton swabs. Urethra was prepped with betadine and a 3F catheter was placed and bladder was drained completely. The bladder was then backfilled with sterile water  by gravity.  First sensation:  First Desire: Strong Desire: Capacity: Cough stress test was negative standing with prolapse reduced. Valsalva stress test was negative.  She was was allowed to void on her own.   Interpretation: CMG showed normal sensation, and normal cystometric capacity. Findings negative for stress incontinence, positive for detrusor overactivity.        No data to display             ASSESSMENT AND PLAN    Julia Sanchez is a 78 y.o. with:  1. Pelvic organ prolapse quantification stage 2 cystocele   2. Vaginal lesion   3. Urinary frequency   4. SUI (stress urinary incontinence, female)     Pelvic organ prolapse quantification stage 2 cystocele Assessment & Plan: - most bothersome symptom - For treatment of pelvic organ prolapse, we discussed options for management including expectant management, conservative management, and surgical management, such as Kegels, a pessary, pelvic floor physical therapy, and specific surgical procedures. - not sexually active, denies desire for future penetrative intercourse - encouraged splinting to ensure complete bladder emptying - We discussed two options for prolapse repair:  1) vaginal repair without mesh - Pros - safer, no mesh complications - Cons - not as strong as mesh repair, higher risk of recurrence  2) laparoscopic repair with mesh - Pros - stronger, better long-term success - Cons - risks of mesh implant (erosion into vagina or bladder, adhering to  the rectum, pain) - these risks are lower than with a vaginal mesh but still  exist  3) Vaginal closure procedure - pros - strong, good long-term success - cons - unable to have penetrative intercourse - pending vaginal biopsy to evaluate vaginal lesion  - pt desires to proceed with obliterative procedure with excision of vaginal epithelium.   Vaginal lesion Assessment & Plan: - 01/09/19 HGSIL, VAIN II/III with no HR HPV - Vaginal cuff biopsy with VIN II-III 01/29/19 s/p 5-FU by Dr. Eloy and recommended yearly surveillance - 04/27/19 ASCUS pap - 2-90mm white lesion noted right of midline at vaginal apex s/p excision pending pathology - will refer back to Dr. Eloy if VIN II-III - if negative, proceed with partial colpectomy with excision of vaginal epithelium - continue low dose vaginal estrogen 2x/week  Orders: -     Surgical pathology  Urinary frequency Assessment & Plan: - 07/19/23 POCT UA neg, bladder scan 51mL - simple CMG with + DO, negative CST - We discussed the symptoms of overactive bladder (OAB), which include urinary urgency, urinary frequency, nocturia, with or without urge incontinence.  While we do not know the exact etiology of OAB, several treatment options exist. We discussed management including behavioral therapy (decreasing bladder irritants, urge suppression strategies, timed voids, bladder retraining), physical therapy, medication; for refractory cases posterior tibial nerve stimulation, sacral neuromodulation, and intravesical botulinum toxin injection.  For anticholinergic medications, we discussed the potential side effects of anticholinergics including dry eyes, dry mouth, constipation, cognitive impairment and urinary retention. For Beta-3 agonist medication, we discussed the potential side effect of elevated blood pressure which is more likely to occur in individuals with uncontrolled hypertension. - encouraged fluid management, Kegel exercises, and caffeine reduction - discussed possible need for OAB treatment postop if persistent  urinary frequency - Rx pyridium  PRN discomfort with urination  Orders: -     Phenazopyridine  HCl; Take 1 tablet (200 mg total) by mouth 3 (three) times daily as needed for pain.  Dispense: 10 tablet; Refill: 0  SUI (stress urinary incontinence, female) Assessment & Plan: - minimal leakage with sneezing only - negative CST on simple CMG with reduction of prolapse - discussed risk of occult SUI - For treatment of stress urinary incontinence,  non-surgical options include expectant management, weight loss, physical therapy, as well as a pessary.  Surgical options include a midurethral sling, Burch urethropexy, and transurethral injection of a bulking agent. - pt desire to avoid mesh use and proceed with periurethral bulking   General Surgical Consent: The patient has previously been counseled on alternative treatments, and the decision by the patient and provider was to proceed with the procedure listed above.  For all procedures, there are risks of bleeding, infection, damage to surrounding organs including but not limited to bowel, bladder, blood vessels, ureters and nerves, and need for further surgery if an injury were to occur. These risks are all low with minimally invasive surgery.   There are risks of numbness and weakness at any body site or buttock/rectal pain.  It is possible that baseline pain can be worsened by surgery, either with or without mesh. If surgery is vaginal, there is also a low risk of possible conversion to laparoscopy or open abdominal incision where indicated. Very rare risks include blood transfusion, blood clot, heart attack, pneumonia, or death.   There is also a risk of short-term postoperative urinary retention with need to use a catheter. About half of patients need to go home from surgery  with a catheter, which is then later removed in the office. The risk of long-term need for a catheter is very low. There is also a risk of worsening of overactive bladder.     Prolapse (with or without mesh): Risk factors for surgical failure  include things that put pressure on your pelvis and the surgical repair, including obesity, chronic cough, and heavy lifting or straining (including lifting children or adults, straining on the toilet, or lifting heavy objects such as furniture or anything weighing >25 lbs. Risks of recurrence is 20-30% with vaginal native tissue repair and a less than 10% with sacrocolpopexy with mesh.    We discussed consent for blood products. Risks for blood transfusion include allergic reactions, other reactions that can affect different body organs and managed accordingly, transmission of infectious diseases such as HIV or Hepatitis. However, the blood is screened. Patient consents for blood products.  Pre-operative instructions:  She was instructed to not take Aspirin /NSAIDs x 7days prior to surgery. She may continue her 81mg  ASA. Antibiotic prophylaxis was ordered as indicated.  Catheter use: Patient will go home with foley if needed after post-operative voiding trial.  Post-operative instructions:  She was provided with specific post-operative instructions, including precautions and signs/symptoms for which we would recommend contacting us , in addition to daytime and after-hours contact phone numbers. This was provided on a handout.   Post-operative medications: Prescriptions for motrin , tylenol , miralax, and oxycodone  will be sent to her pharmacy once surgery is scheduled. Discussed using ibuprofen  and tylenol  on a schedule to limit use of narcotics.   Laboratory testing:  We will check labs: CBC, CMP, T&S  Preoperative clearance:  She does require preoperative risk assessment with PCP, note sent to Dr. Nicola office due to history of aortic valve stenosis. Last Echo 02/28/23 LVEF 70-75%, mild left ventricular hypertrophy, and grade I diastolic dysfunction.  Post-operative follow-up:  A post-operative appointment will be made for 6 weeks  from the date of surgery. If she needs a post-operative nurse visit for a voiding trial, that will be set up after she leaves the hospital.    Patient will call the clinic or use MyChart should anything change or any new issues arise.   Time spent: I spent 47 minutes dedicated to the care of this patient on the date of this encounter to include pre-visit review of records, face-to-face time with the patient discussing stage II pelvic organ prolapse, vaginal lesion with VAIN I, stress urinary incontinence, urinary frequency, and post visit documentation and ordering medication/ testing.    Lianne ONEIDA Gillis, MD

## 2023-08-19 ENCOUNTER — Encounter: Payer: Self-pay | Admitting: Obstetrics

## 2023-08-19 DIAGNOSIS — N393 Stress incontinence (female) (male): Secondary | ICD-10-CM | POA: Insufficient documentation

## 2023-08-19 NOTE — Assessment & Plan Note (Signed)
-   minimal leakage with sneezing only - negative CST on simple CMG with reduction of prolapse - discussed risk of occult SUI - For treatment of stress urinary incontinence,  non-surgical options include expectant management, weight loss, physical therapy, as well as a pessary.  Surgical options include a midurethral sling, Burch urethropexy, and transurethral injection of a bulking agent. - pt desire to avoid mesh use and proceed with periurethral bulking

## 2023-08-19 NOTE — Addendum Note (Signed)
 Addended byWyatt Haste T on: 08/19/2023 05:54 PM   Modules accepted: Orders

## 2023-08-19 NOTE — Assessment & Plan Note (Signed)
-   most bothersome symptom - For treatment of pelvic organ prolapse, we discussed options for management including expectant management, conservative management, and surgical management, such as Kegels, a pessary, pelvic floor physical therapy, and specific surgical procedures. - not sexually active, denies desire for future penetrative intercourse - encouraged splinting to ensure complete bladder emptying - We discussed two options for prolapse repair:  1) vaginal repair without mesh - Pros - safer, no mesh complications - Cons - not as strong as mesh repair, higher risk of recurrence  2) laparoscopic repair with mesh - Pros - stronger, better long-term success - Cons - risks of mesh implant (erosion into vagina or bladder, adhering to the rectum, pain) - these risks are lower than with a vaginal mesh but still exist  3) Vaginal closure procedure - pros - strong, good long-term success - cons - unable to have penetrative intercourse - pending vaginal biopsy to evaluate vaginal lesion  - pt desires to proceed with obliterative procedure with excision of vaginal epithelium.

## 2023-08-19 NOTE — Assessment & Plan Note (Addendum)
-   01/09/19 HGSIL, VAIN II/III with no HR HPV - Vaginal cuff biopsy with VIN II-III 01/29/19 s/p 5-FU by Dr. Eloy and recommended yearly surveillance - 04/27/19 ASCUS pap - 2-9mm white lesion noted right of midline at vaginal apex s/p excision pending pathology - will refer back to Dr. Eloy if VIN II-III - if negative, proceed with partial colpectomy with excision of vaginal epithelium - continue low dose vaginal estrogen 2x/week

## 2023-08-19 NOTE — Assessment & Plan Note (Addendum)
-   07/19/23 POCT UA neg, bladder scan 51mL - simple CMG with + DO, negative CST - We discussed the symptoms of overactive bladder (OAB), which include urinary urgency, urinary frequency, nocturia, with or without urge incontinence.  While we do not know the exact etiology of OAB, several treatment options exist. We discussed management including behavioral therapy (decreasing bladder irritants, urge suppression strategies, timed voids, bladder retraining), physical therapy, medication; for refractory cases posterior tibial nerve stimulation, sacral neuromodulation, and intravesical botulinum toxin injection.  For anticholinergic medications, we discussed the potential side effects of anticholinergics including dry eyes, dry mouth, constipation, cognitive impairment and urinary retention. For Beta-3 agonist medication, we discussed the potential side effect of elevated blood pressure which is more likely to occur in individuals with uncontrolled hypertension. - encouraged fluid management, Kegel exercises, and caffeine reduction - discussed possible need for OAB treatment postop if persistent urinary frequency - Rx pyridium  PRN discomfort with urination

## 2023-08-22 LAB — SURGICAL PATHOLOGY

## 2023-08-23 ENCOUNTER — Telehealth: Payer: Self-pay

## 2023-08-23 ENCOUNTER — Telehealth: Payer: Self-pay | Admitting: Obstetrics

## 2023-08-23 NOTE — Telephone Encounter (Addendum)
 Called regarding vaginal biopsy result with VAIN II-III.  Will review case with gyn onc since last followed by Dr. Eloy and recommended vaginal laser for VAIN II-III in 2020.  Discussed need to will any postop follow-up plan if recommended prior to surgery. Patient expresses understanding. All questions answered.

## 2023-08-23 NOTE — Telephone Encounter (Signed)
 Patient called office in regards to her pathology report. She is requesting to speak with you so that she can receive an explanation of the report. Please advise.

## 2023-08-26 NOTE — Telephone Encounter (Signed)
Reviewed case with gyn oncology. Discussed partial vaginectomy as treatment option for VAIN II-III Recommended follow-up exam every 6 months for 2 years, annually after with annual cotesting. Follows up with Dr. Marcelle Overlie. Discussed low threshold for repeat biopsy if abnormal exam despite normal pap smear with cotesting due to 6-18% risk of progression.  Patient expresses understanding and desires to proceed. Will notify Dr. Marcelle Overlie for postoperative follow-up.

## 2023-08-31 ENCOUNTER — Telehealth: Payer: Self-pay | Admitting: Obstetrics

## 2023-08-31 NOTE — Telephone Encounter (Signed)
Reviewed case with Dr. Marcelle Overlie.  Office to schedule vaginal colposcopy prior to surgery.  Follow-up every 6 months for 2 years and annually with annual cotesting postop.  Patient expresses understanding. All questions answered.

## 2023-09-01 ENCOUNTER — Other Ambulatory Visit: Payer: Self-pay

## 2023-09-01 ENCOUNTER — Other Ambulatory Visit: Payer: Self-pay | Admitting: Internal Medicine

## 2023-09-02 NOTE — Telephone Encounter (Signed)
Called DR [facility] office. They will have the nurse review and speak with the provider and get her scheduled. Will call back in a few days to check appointment information.

## 2023-09-05 NOTE — Telephone Encounter (Signed)
Patient had her colposcopy done today with Dr Charlesetta Shanks office.

## 2023-09-06 NOTE — Telephone Encounter (Signed)
Per patient no biopsy was done. Ok to set up surgery?

## 2023-09-07 NOTE — Telephone Encounter (Addendum)
Per Dr Olena Leatherwood surgery scheduler has been notified.

## 2023-09-15 ENCOUNTER — Telehealth: Payer: Self-pay | Admitting: Obstetrics

## 2023-09-15 NOTE — Telephone Encounter (Signed)
 Pt called in asking if there was a date for her surgery yet.  She can be reached after 1pm today.  She said she's not in a rush, but she is trying to plan another event.

## 2023-09-30 NOTE — Telephone Encounter (Signed)
 Patient called wanting an update on surgery schedule, please follow up.

## 2023-11-10 ENCOUNTER — Ambulatory Visit: Payer: Medicare Other

## 2023-11-10 VITALS — Ht 66.5 in | Wt 170.0 lb

## 2023-11-10 DIAGNOSIS — Z0000 Encounter for general adult medical examination without abnormal findings: Secondary | ICD-10-CM

## 2023-11-10 NOTE — Progress Notes (Signed)
 Subjective:   Julia Sanchez is a 78 y.o. who presents for a Medicare Wellness preventive visit.  Visit Complete: Virtual I connected with  Horton Marshall on 11/10/23 by a audio enabled telemedicine application and verified that I am speaking with the correct person using two identifiers.  Patient Location: Home  Provider Location: Office/Clinic  I discussed the limitations of evaluation and management by telemedicine. The patient expressed understanding and agreed to proceed.  Vital Signs: Because this visit was a virtual/telehealth visit, some criteria may be missing or patient reported. Any vitals not documented were not able to be obtained and vitals that have been documented are patient reported.  VideoDeclined- This patient declined Librarian, academic. Therefore the visit was completed with audio only.  Persons Participating in Visit: Patient.  AWV Questionnaire: No: Patient Medicare AWV questionnaire was not completed prior to this visit.  Cardiac Risk Factors include: advanced age (>51men, >45 women);dyslipidemia;hypertension     Objective:    Today's Vitals   11/10/23 1056  Weight: 170 lb (77.1 kg)  Height: 5' 6.5" (1.689 m)   Body mass index is 27.03 kg/m.     11/10/2023   10:55 AM 05/05/2022    9:19 AM 05/04/2021    5:40 PM 01/16/2020    2:50 PM 01/29/2019   10:05 AM 12/27/2018    2:36 PM 04/21/2018    8:47 AM  Advanced Directives  Does Patient Have a Medical Advance Directive? Yes Yes Yes Yes Yes Yes Yes  Type of Estate agent of [place];Living will Healthcare Power of [place];Living will Healthcare Power of [place];Living will  Living will;Healthcare Power of [employer] Power of [place];Living will Healthcare Power of [place];Living will  Does patient want to make changes to medical advance directive?   No - Patient declined No - Patient declined   No - Patient declined  Copy of Healthcare Power of  Attorney in Chart? No - copy requested No - copy requested No - copy requested  No - copy requested No - copy requested No - copy requested    Current Medications (verified) Outpatient Encounter Medications as of 11/10/2023  Medication Sig   amitriptyline (ELAVIL) 50 MG tablet TAKE 1 TABLET BY MOUTH AT BEDTIME   amLODipine (NORVASC) 10 MG tablet Take 1 tablet (10 mg total) by mouth daily.   aspirin EC 81 MG tablet Take 1 tablet (81 mg total) by mouth daily.   atorvastatin (LIPITOR) 20 MG tablet Take 1 tablet (20 mg total) by mouth daily.   diclofenac sodium (VOLTAREN) 1 % GEL Apply 4 g topically 4 (four) times daily as needed.   estradiol (ESTRACE) 0.1 MG/GM vaginal cream Place 0.5 g vaginally 2 (two) times a week. Place 0.5g nightly for two weeks then twice a week after   ibuprofen (ADVIL) 800 MG tablet 1-2 tab by mouth at bedtime as needed for pain   phenazopyridine (PYRIDIUM) 200 MG tablet Take 1 tablet (200 mg total) by mouth 3 (three) times daily as needed for pain.   potassium chloride (KLOR-CON) 10 MEQ tablet TAKE ONE TABLET BY MOUTH DAILY   telmisartan-hydrochlorothiazide (MICARDIS HCT) 80-25 MG tablet Take 1 tablet by mouth daily.   No facility-administered encounter medications on file as of 11/10/2023.    Allergies (verified) Nitrofurantoin and Sulfonamide derivatives   History: Past Medical History:  Diagnosis Date   Anxiety    due to stress - no stress   DIVERTICULOSIS, COLON 04/07/2009   HYPERLIPIDEMIA 05/03/2007  HYPERTENSION 05/03/2007   SVD (spontaneous vaginal delivery)    x 2   Past Surgical History:  Procedure Laterality Date   COLONOSCOPY     CYSTOSCOPY     EYE SURGERY  05/2012   macular hole - left   EYE SURGERY  2014   cataract removed left eye   HYSTEROSCOPY WITH D & C  03/06/2013   Procedure: DILATATION AND CURETTAGE /HYSTEROSCOPY;  Surgeon: Meriel Pica, MD;  Location: [facility];  Service: Gynecology;;   LAPAROSCOPIC ASSISTED VAGINAL HYSTERECTOMY N/A  06/27/2014   Procedure: LAPAROSCOPIC ASSISTED VAGINAL HYSTERECTOMY WITH BILATERAL SALPINGO OOPHORECTOMY;  Surgeon: Meriel Pica, MD;  Location: [facility];  Service: Gynecology;  Laterality: N/A;   SALPINGOOPHORECTOMY Bilateral 06/27/2014   Procedure: SALPINGO OOPHORECTOMY;  Surgeon: Meriel Pica, MD;  Location: [facility];  Service: Gynecology;  Laterality: Bilateral;   TONSILLECTOMY     WISDOM TOOTH EXTRACTION     Family History  Problem Relation Age of Onset   Heart failure Mother    Heart disease Father        CAD/MI- fatal   Coronary artery disease Father        Hx MI   Breast cancer Maternal Aunt    Diabetes Maternal Uncle    Diabetes Cousin    Uterine cancer Neg Hx    Bladder Cancer Neg Hx    Social History   Socioeconomic History   Marital status: Widowed    Spouse name: Not on file   Number of children: 2   Years of education: 9   Highest education level: Not on file  Occupational History   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  Lots of family stress. Widowed 2020   Social Drivers of Health   Financial Resource Strain: Low Risk  (11/10/2023)   Overall Financial Resource Strain (CARDIA)    Difficulty of Paying Living Expenses: Not hard at all  Food Insecurity: No Food Insecurity  (11/10/2023)   Hunger Vital Sign    Worried About Running Out of Food in the Last Year: Never true    Ran Out of Food in the Last Year: Never true  Transportation Needs: No Transportation Needs (11/10/2023)   PRAPARE - Administrator, Civil Service (Medical): No    Lack of Transportation (Non-Medical): No  Physical Activity: Insufficiently Active (11/10/2023)   Exercise Vital Sign    Days of Exercise per Week: 1 day    Minutes of Exercise per Session: 30 min  Stress: No Stress Concern Present (11/10/2023)   [employer] of Occupational Health - Occupational Stress Questionnaire    Feeling of Stress : Not at all  Social Connections: Moderately Integrated (11/10/2023)  Social Advertising account executive [NHANES]    Frequency of Communication with Friends and Family: More than three times a week    Frequency of Social Gatherings with Friends and Family: More than three times a week    Attends Religious Services: More than 4 times per year    Active Member of [employer] or Organizations: Yes    Attends Banker Meetings: More than 4 times per year    Marital Status: Widowed    Tobacco Counseling Counseling given: Not Answered    Clinical Intake:  Pre-visit preparation completed: Yes  Pain : No/denies pain     BMI - recorded: 27.03 Nutritional Status: BMI 25 -29 Overweight Nutritional Risks: None Diabetes: No  Lab Results  Component Value Date   HGBA1C 6.1 02/14/2023   HGBA1C 6.1 02/12/2022   HGBA1C 6.3 01/22/2021     How often do you need to have someone help you when you read instructions, pamphlets, or other written materials from your doctor or pharmacy?: 1 - Never  Interpreter Needed?: No  Information entered by :: Hassell Halim, CMA   Activities of Daily Living     11/10/2023   11:00 AM  In your present state of health, do you have any difficulty performing the following activities:  Hearing? 0  Vision? 0  Difficulty concentrating or  making decisions? 0  Walking or climbing stairs? 0  Dressing or bathing? 0  Doing errands, shopping? 0  Preparing Food and eating ? N  Using the Toilet? N  In the past six months, have you accidently leaked urine? N  Do you have problems with loss of bowel control? N  Managing your Medications? N  Managing your Finances? N  Housekeeping or managing your Housekeeping? N    Patient Care Team: Corwin Levins, MD as PCP - General (Internal Medicine) Richarda Overlie, MD (Obstetrics and Gynecology) Mardella Layman, MD (Gastroenterology) Glenna Fellows, MD (Inactive) (General Surgery) Marzella Schlein., MD (Ophthalmology) Tyrone Schimke, MD as Consulting Physician (Ophthalmology)  Indicate any recent Medical Services you may have received from other than [facility] providers in the past year (date may be approximate).     Assessment:   This is a routine wellness examination for Ciarra.  Hearing/Vision screen Hearing Screening - Comments:: Denies hearing difficulties   Vision Screening - Comments:: Wears rx glasses - up to date with routine eye exams with Dr Lucretia Roers   Goals Addressed               This Visit's Progress     Patient Stated (pt-stated)        Patient stated she wants to walk more (exercise) and is having bladder surgery this year.       Depression Screen     11/10/2023   11:06 AM 02/14/2023    8:14 AM 05/05/2022    9:24 AM 02/12/2022    8:14 AM 05/04/2021    5:37 PM 01/22/2021    9:15 AM 01/22/2020   10:30 AM  PHQ 2/9 Scores  PHQ - 2 Score 0 0 0 0 0 0 1  PHQ- 9 Score 1   0       Fall Risk     11/10/2023   11:01 AM 02/14/2023    8:14 AM 05/05/2022    9:20 AM 02/12/2022    8:32 AM 02/12/2022    8:14 AM  Fall Risk   Falls in the past year? 0 0 0 0 0  Number falls  in past yr: 0 0 0 0 0  Injury with Fall? 0 0 0 0 0  Risk for fall due to : No Fall Risks No Fall Risks No Fall Risks    Follow up Falls prevention discussed;Falls evaluation completed Falls evaluation  completed Falls prevention discussed      MEDICARE RISK AT HOME:  Medicare Risk at Home Any stairs in or around the home?: Yes If so, are there any without handrails?: No Home free of loose throw rugs in walkways, pet beds, electrical cords, etc?: Yes Adequate lighting in your home to reduce risk of falls?: Yes Life alert?: No Use of a cane, walker or w/c?: No Grab bars in the bathroom?: Yes Shower chair or bench in shower?: No Elevated toilet seat or a handicapped toilet?: Yes  TIMED UP AND GO:  Was the test performed?  No  Cognitive Function: 6CIT completed        11/10/2023   11:03 AM 05/05/2022    9:34 AM 05/04/2021    5:44 PM 01/16/2020    2:55 PM  6CIT Screen  What Year? 0 points 0 points 0 points 0 points  What month? 0 points 0 points 0 points 0 points  What time? 0 points 0 points 0 points 0 points  Count back from 20 0 points 0 points 0 points 0 points  Months in reverse 0 points 0 points 0 points 0 points  Repeat phrase 0 points 0 points 0 points 0 points  Total Score 0 points 0 points 0 points 0 points    Immunizations Immunization History  Administered Date(s) Administered   Influenza Split 04/23/2011   Influenza Whole 05/13/2009, 06/09/2012   Influenza, High Dose Seasonal PF 06/03/2016   Influenza, Quadrivalent, Recombinant, Inj, Pf 05/25/2018   Influenza,inj,Quad PF,6+ Mos 06/28/2014   Influenza-Unspecified 05/27/2017, 05/25/2018, 06/29/2018   Janssen (J&J) SARS-COV-2 Vaccination 09/10/2019   Pneumococcal Conjugate-13 10/24/2013   Pneumococcal Polysaccharide-23 11/20/2008, 01/02/2014   Td 10/11/2004, 11/19/2014    Screening Tests Health Maintenance  Topic Date Due   Zoster Vaccines- Shingrix (1 of 2) Never done   INFLUENZA VACCINE  03/09/2024   Medicare Annual Wellness (AWV)  11/09/2024   DTaP/Tdap/Td (3 - Tdap) 11/18/2024   Pneumonia Vaccine 45+ Years old  Completed   DEXA SCAN  Completed   Hepatitis C Screening  Completed   HPV VACCINES  Aged  Out   Colonoscopy  Discontinued   COVID-19 Vaccine  Discontinued    Health Maintenance  Health Maintenance Due  Topic Date Due   Zoster Vaccines- Shingrix (1 of 2) Never done   Health Maintenance Items Addressed: 11/10/2023   Additional Screening:  Vision Screening: Recommended annual ophthalmology exams for early detection of glaucoma and other disorders of the eye.  Dental Screening: Recommended annual dental exams for proper oral hygiene  Community Resource Referral / Chronic Care Management: CRR required this visit?  No   CCM required this visit?  No     Plan:     I have personally reviewed and noted the following in the patient's chart:   Medical and social history Use of alcohol, tobacco or illicit drugs  Current medications and supplements including opioid prescriptions. Patient is not currently taking opioid prescriptions. Functional ability and status Nutritional status Physical activity Advanced directives List of other physicians Hospitalizations, surgeries, and ER visits in previous 12 months Vitals Screenings to include cognitive, depression, and falls Referrals and appointments  In addition, I have reviewed and discussed with patient  certain preventive protocols, quality metrics, and best practice recommendations. A written personalized care plan for preventive services as well as general preventive health recommendations were provided to patient.     Darreld Mclean, CMA   11/10/2023   After Visit Summary: (MyChart) Due to this being a telephonic visit, the after visit summary with patients personalized plan was offered to patient via MyChart   Notes: Nothing significant to report at this time.

## 2023-11-10 NOTE — Patient Instructions (Addendum)
 Ms. Julia Sanchez , Thank you for taking time to come for your Medicare Wellness Visit. I appreciate your ongoing commitment to your health goals. Please review the following plan we discussed and let me know if I can assist you in the future.   Referrals/Orders/Follow-Ups/Clinician Recommendations: Aim for 30 minutes of exercise or brisk walking, 6-8 glasses of water, and 5 servings of fruits and vegetables each day.   This is a list of the screening recommended for you and due dates:  Health Maintenance  Topic Date Due   Zoster (Shingles) Vaccine (1 of 2) Never done   Flu Shot  03/09/2024   Medicare Annual Wellness Visit  11/09/2024   DTaP/Tdap/Td vaccine (3 - Tdap) 11/18/2024   Pneumonia Vaccine  Completed   DEXA scan (bone density measurement)  Completed   Hepatitis C Screening  Completed   HPV Vaccine  Aged Out   Colon Cancer Screening  Discontinued   COVID-19 Vaccine  Discontinued    Advanced directives: (Copy Requested) Please bring a copy of your health care power of attorney and living will to the office to be added to your chart at your convenience. You can mail to [facility] [address] [place], [place] [address] or email to [email or web address]  Next Medicare Annual Wellness Visit scheduled for next year: Yes

## 2023-11-21 ENCOUNTER — Ambulatory Visit (INDEPENDENT_AMBULATORY_CARE_PROVIDER_SITE_OTHER): Admitting: Obstetrics and Gynecology

## 2023-11-21 ENCOUNTER — Encounter: Payer: Self-pay | Admitting: Obstetrics and Gynecology

## 2023-11-21 ENCOUNTER — Other Ambulatory Visit: Payer: Self-pay | Admitting: Obstetrics and Gynecology

## 2023-11-21 VITALS — BP 145/75 | HR 80

## 2023-11-21 DIAGNOSIS — N8110 Cystocele, unspecified: Secondary | ICD-10-CM

## 2023-11-21 DIAGNOSIS — N393 Stress incontinence (female) (male): Secondary | ICD-10-CM

## 2023-11-21 DIAGNOSIS — Z01818 Encounter for other preprocedural examination: Secondary | ICD-10-CM

## 2023-11-21 MED ORDER — ACETAMINOPHEN 500 MG PO TABS: 500.0000 mg | ORAL_TABLET | Freq: Four times a day (QID) | ORAL | 0 refills | Status: AC | PRN

## 2023-11-21 MED ORDER — OXYCODONE HCL 5 MG PO TABS: 5.0000 mg | ORAL_TABLET | ORAL | 0 refills | Status: AC | PRN

## 2023-11-21 MED ORDER — IBUPROFEN 600 MG PO TABS: 600.0000 mg | ORAL_TABLET | Freq: Four times a day (QID) | ORAL | 0 refills | Status: AC | PRN

## 2023-11-21 NOTE — Progress Notes (Signed)
 [facility] Urogynecology Pre-Operative Exam  Subjective Chief Complaint: Julia Sanchez presents for a preoperative encounter.   History of Present Illness: Julia Sanchez is a 78 y.o. female who presents for preoperative visit.  She is scheduled to undergo Exam under anesthesia, partial vaginectomy, colporrhaphy, posterior rectocele repair, perineoplasty, injection of urethral bulking agent, cystoscopy  on 12/07/23.  Her symptoms include pelvic organ prolapse, and she was was found to have Stage II anterior, Stage II posterior, Stage II apical prolapse.   Urodynamics showed: CMG showed normal sensation, and normal cystometric capacity. Findings negative for stress incontinence, positive for detrusor overactivity.   Past Medical History:  Diagnosis Date   Anxiety    due to stress - no stress   DIVERTICULOSIS, COLON 04/07/2009   HYPERLIPIDEMIA 05/03/2007   HYPERTENSION 05/03/2007   SVD (spontaneous vaginal delivery)    x 2     Past Surgical History:  Procedure Laterality Date   COLONOSCOPY     CYSTOSCOPY     EYE SURGERY  05/2012   macular hole - left   EYE SURGERY  2014   cataract removed left eye   HYSTEROSCOPY WITH D & C  03/06/2013   Procedure: DILATATION AND CURETTAGE /HYSTEROSCOPY;  Surgeon: Meriel Pica, MD;  Location: [facility];  Service: Gynecology;;   LAPAROSCOPIC ASSISTED VAGINAL HYSTERECTOMY N/A 06/27/2014   Procedure: LAPAROSCOPIC ASSISTED VAGINAL HYSTERECTOMY WITH BILATERAL SALPINGO OOPHORECTOMY;  Surgeon: Meriel Pica, MD;  Location: [facility];  Service: Gynecology;  Laterality: N/A;   SALPINGOOPHORECTOMY Bilateral 06/27/2014   Procedure: SALPINGO OOPHORECTOMY;  Surgeon: Meriel Pica, MD;  Location: [facility];  Service: Gynecology;  Laterality: Bilateral;   TONSILLECTOMY     WISDOM TOOTH EXTRACTION      is allergic to nitrofurantoin and sulfonamide derivatives.   Family History  Problem Relation Age of Onset   Heart failure Mother    Heart disease  Father        CAD/MI- fatal   Coronary artery disease Father        Hx MI   Breast cancer Maternal Aunt    Diabetes Maternal Uncle    Diabetes Cousin    Uterine cancer Neg Hx    Bladder Cancer Neg Hx     Social History   Tobacco Use   Smoking status: Never    Passive exposure: Never   Smokeless tobacco: Never  Vaping Use   Vaping status: Never Used  Substance Use Topics   Alcohol use: Yes    Alcohol/week: 1.0 standard drink of alcohol    Types: 1 Glasses of wine per week    Comment: occassional   Drug use: No     Review of Systems was negative for a full 10 system review except as noted in the History of Present Illness.   Current Outpatient Medications:    acetaminophen (TYLENOL) 500 MG tablet, Take 1 tablet (500 mg total) by mouth every 6 (six) hours as needed (pain)., Disp: 30 tablet, Rfl: 0   amitriptyline (ELAVIL) 50 MG tablet, TAKE 1 TABLET BY MOUTH AT BEDTIME, Disp: 90 tablet, Rfl: 1   amLODipine (NORVASC) 10 MG tablet, Take 1 tablet (10 mg total) by mouth daily., Disp: 90 tablet, Rfl: 3   aspirin EC 81 MG tablet, Take 1 tablet (81 mg total) by mouth daily., Disp: 90 tablet, Rfl: 11   atorvastatin (LIPITOR) 20 MG tablet, Take 1 tablet (20 mg total) by mouth daily., Disp: 90 tablet, Rfl: 3   diclofenac sodium (  VOLTAREN) 1 % GEL, Apply 4 g topically 4 (four) times daily as needed., Disp: 400 g, Rfl: 5   estradiol (ESTRACE) 0.1 MG/GM vaginal cream, Place 0.5 g vaginally 2 (two) times a week. Place 0.5g nightly for two weeks then twice a week after, Disp: 30 g, Rfl: 11   ibuprofen (ADVIL) 600 MG tablet, Take 1 tablet (600 mg total) by mouth every 6 (six) hours as needed., Disp: 30 tablet, Rfl: 0   ibuprofen (ADVIL) 800 MG tablet, 1-2 tab by mouth at bedtime as needed for pain, Disp: 60 tablet, Rfl: 2   oxyCODONE (OXY IR/ROXICODONE) 5 MG immediate release tablet, Take 1 tablet (5 mg total) by mouth every 4 (four) hours as needed for severe pain (pain score 7-10)., Disp: 15  tablet, Rfl: 0   phenazopyridine (PYRIDIUM) 200 MG tablet, Take 1 tablet (200 mg total) by mouth 3 (three) times daily as needed for pain., Disp: 10 tablet, Rfl: 0   potassium chloride (KLOR-CON) 10 MEQ tablet, TAKE ONE TABLET BY MOUTH DAILY, Disp: 90 tablet, Rfl: 3   telmisartan-hydrochlorothiazide (MICARDIS HCT) 80-25 MG tablet, Take 1 tablet by mouth daily., Disp: 90 tablet, Rfl: 3   Objective Vitals:   11/21/23 0758  BP: (!) 145/75  Pulse: 80    Gen: NAD CV: S1 S2 RRR Lungs: Clear to auscultation bilaterally Abd: soft, nontender   Previous Pelvic Exam showed: POP-Q   1                                            Aa   1                                           Ba   0                                              C    3                                            Gh   3                                            Pb   6                                            tvl    1                                            Ap   1  Bp                                               Assessment/ Plan  Assessment: The patient is an 78 y.o. year old scheduled to undergo Exam under anesthesia, partial vaginectomy, colporrhaphy, posterior rectocele repair, perineoplasty, injection of urethral bulking agent, cystoscopy. Verbal consent was obtained for these procedures.  Plan: General Surgical Consent: The patient has previously been counseled on alternative treatments, and the decision by the patient and provider was to proceed with the procedure listed above.  For all procedures, there are risks of bleeding, infection, damage to surrounding organs including but not limited to bowel, bladder, blood vessels, ureters and nerves, and need for further surgery if an injury were to occur. These risks are all low with minimally invasive surgery.   There are risks of numbness and weakness at any body site or buttock/rectal pain.  It is possible that  baseline pain can be worsened by surgery, either with or without mesh. If surgery is vaginal, there is also a low risk of possible conversion to laparoscopy or open abdominal incision where indicated. Very rare risks include blood transfusion, blood clot, heart attack, pneumonia, or death.   There is also a risk of short-term postoperative urinary retention with need to use a catheter. About half of patients need to go home from surgery with a catheter, which is then later removed in the office. The risk of long-term need for a catheter is very low. There is also a risk of worsening of overactive bladder.    Prolapse (with or without mesh): Risk factors for surgical failure  include things that put pressure on your pelvis and the surgical repair, including obesity, chronic cough, and heavy lifting or straining (including lifting children or adults, straining on the toilet, or lifting heavy objects such as furniture or anything weighing >25 lbs. Risks of recurrence is 20-30% with vaginal native tissue repair and a less than 10% with sacrocolpopexy with mesh.    We discussed consent for blood products. Risks for blood transfusion include allergic reactions, other reactions that can affect different body organs and managed accordingly, transmission of infectious diseases such as HIV or Hepatitis. However, the blood is screened. Patient consents for blood products.  Pre-operative instructions:  She was instructed to not take Aspirin/NSAIDs x 7days prior to surgery. Antibiotic prophylaxis was ordered as indicated.  Catheter use: Patient will go home with foley if needed after post-operative voiding trial.  Post-operative instructions:  She was provided with specific post-operative instructions, including precautions and signs/symptoms for which we would recommend contacting us, in addition to daytime and after-hours contact phone numbers. This was provided on a handout.   Post-operative medications:  Prescriptions for motrin, tylenol, miralax, and oxycodone were sent to her pharmacy. Discussed using ibuprofen and tylenol on a schedule to limit use of narcotics.   Laboratory testing:  We will check labs: As requested by anesthesia.   Preoperative clearance:  She does not require surgical clearance.    Caprini score: 5  Post-operative follow-up:  A post-operative appointment will be made for 6 weeks from the date of surgery. If she needs a post-operative nurse visit for a voiding trial, that will be set up after she leaves the hospital.    Patient will call the clinic or use MyChart should anything change or any new  issues arise.   Rubyann Lingle G Johnthomas Lader, NP

## 2023-11-21 NOTE — H&P (Signed)
 [facility] H&P  Subjective Chief Complaint: Julia Sanchez presents for a preoperative encounter.   History of Present Illness: Julia Sanchez is a 78 y.o. female who presents for preoperative visit.  She is scheduled to undergo Exam under anesthesia, partial vaginectomy, colporrhaphy, posterior rectocele repair, perineoplasty, injection of urethral bulking agent, cystoscopy  on 12/07/23.  Her symptoms include pelvic organ prolapse, and she was was found to have Stage II anterior, Stage II posterior, Stage II apical prolapse.   Urodynamics showed: CMG showed normal sensation, and normal cystometric capacity. Findings negative for stress incontinence, positive for detrusor overactivity.   Past Medical History:  Diagnosis Date   Anxiety    due to stress - no stress   DIVERTICULOSIS, COLON 04/07/2009   HYPERLIPIDEMIA 05/03/2007   HYPERTENSION 05/03/2007   SVD (spontaneous vaginal delivery)    x 2     Past Surgical History:  Procedure Laterality Date   COLONOSCOPY     CYSTOSCOPY     EYE SURGERY  05/2012   macular hole - left   EYE SURGERY  2014   cataract removed left eye   HYSTEROSCOPY WITH D & C  03/06/2013   Procedure: DILATATION AND CURETTAGE /HYSTEROSCOPY;  Surgeon: Piedad Brewer, MD;  Location: [facility];  Service: Gynecology;;   LAPAROSCOPIC ASSISTED VAGINAL HYSTERECTOMY N/A 06/27/2014   Procedure: LAPAROSCOPIC ASSISTED VAGINAL HYSTERECTOMY WITH BILATERAL SALPINGO OOPHORECTOMY;  Surgeon: Piedad Brewer, MD;  Location: [facility];  Service: Gynecology;  Laterality: N/A;   SALPINGOOPHORECTOMY Bilateral 06/27/2014   Procedure: SALPINGO OOPHORECTOMY;  Surgeon: Piedad Brewer, MD;  Location: [facility];  Service: Gynecology;  Laterality: Bilateral;   TONSILLECTOMY     WISDOM TOOTH EXTRACTION      is allergic to nitrofurantoin and sulfonamide derivatives.   Family History  Problem Relation Age of Onset   Heart failure Mother    Heart disease Father         CAD/MI- fatal   Coronary artery disease Father        Hx MI   Breast cancer Maternal Aunt    Diabetes Maternal Uncle    Diabetes Cousin    Uterine cancer Neg Hx    Bladder Cancer Neg Hx     Social History   Tobacco Use   Smoking status: Never    Passive exposure: Never   Smokeless tobacco: Never  Vaping Use   Vaping status: Never Used  Substance Use Topics   Alcohol use: Yes    Alcohol/week: 1.0 standard drink of alcohol    Types: 1 Glasses of wine per week    Comment: occassional   Drug use: No     Review of Systems was negative for a full 10 system review except as noted in the History of Present Illness.  No current facility-administered medications for this encounter.  Current Outpatient Medications:    acetaminophen (TYLENOL) 500 MG tablet, Take 1 tablet (500 mg total) by mouth every 6 (six) hours as needed (pain)., Disp: 30 tablet, Rfl: 0   amitriptyline (ELAVIL) 50 MG tablet, TAKE 1 TABLET BY MOUTH AT BEDTIME, Disp: 90 tablet, Rfl: 1   amLODipine (NORVASC) 10 MG tablet, Take 1 tablet (10 mg total) by mouth daily., Disp: 90 tablet, Rfl: 3   aspirin EC 81 MG tablet, Take 1 tablet (81 mg total) by mouth daily., Disp: 90 tablet, Rfl: 11   atorvastatin (LIPITOR) 20 MG tablet, Take 1 tablet (20 mg total) by mouth daily., Disp: 90 tablet,  Rfl: 3   diclofenac sodium (VOLTAREN) 1 % GEL, Apply 4 g topically 4 (four) times daily as needed., Disp: 400 g, Rfl: 5   estradiol (ESTRACE) 0.1 MG/GM vaginal cream, Place 0.5 g vaginally 2 (two) times a week. Place 0.5g nightly for two weeks then twice a week after, Disp: 30 g, Rfl: 11   ibuprofen (ADVIL) 600 MG tablet, Take 1 tablet (600 mg total) by mouth every 6 (six) hours as needed., Disp: 30 tablet, Rfl: 0   ibuprofen (ADVIL) 800 MG tablet, 1-2 tab by mouth at bedtime as needed for pain, Disp: 60 tablet, Rfl: 2   oxyCODONE (OXY IR/ROXICODONE) 5 MG immediate release tablet, Take 1 tablet (5 mg total) by mouth every 4 (four) hours as  needed for severe pain (pain score 7-10)., Disp: 15 tablet, Rfl: 0   phenazopyridine (PYRIDIUM) 200 MG tablet, Take 1 tablet (200 mg total) by mouth 3 (three) times daily as needed for pain., Disp: 10 tablet, Rfl: 0   potassium chloride (KLOR-CON) 10 MEQ tablet, TAKE ONE TABLET BY MOUTH DAILY, Disp: 90 tablet, Rfl: 3   telmisartan-hydrochlorothiazide (MICARDIS HCT) 80-25 MG tablet, Take 1 tablet by mouth daily., Disp: 90 tablet, Rfl: 3   Objective There were no vitals filed for this visit.   Gen: NAD CV: S1 S2 RRR Lungs: Clear to auscultation bilaterally Abd: soft, nontender   Previous Pelvic Exam showed: POP-Q   1                                            Aa   1                                           Ba   0                                              C    3                                            Gh   3                                            Pb   6                                            tvl    1                                            Ap   1  Bp                                               Assessment/ Plan  Assessment: The patient is a 78 y.o. year old scheduled to undergo Exam under anesthesia, partial vaginectomy, colporrhaphy, posterior rectocele repair, perineoplasty, injection of urethral bulking agent, cystoscopy. Verbal consent was obtained for these procedures.

## 2023-11-28 ENCOUNTER — Other Ambulatory Visit: Payer: Self-pay | Admitting: Internal Medicine

## 2023-11-29 ENCOUNTER — Encounter (HOSPITAL_COMMUNITY): Payer: Self-pay | Admitting: Obstetrics

## 2023-11-29 NOTE — Progress Notes (Signed)
 Spoke w/ via phone for pre-op interview--- Julia Sanchez Lab needs dos----   BMP and EKG per anesthesia.      Lab results------ COVID test -----patient states asymptomatic no test needed Arrive at -------0930 NPO after MN NO Solid Food.  Clear liquids from MN until---0830 Pre-Surgery Ensure or G2:  Med rec completed Medications to take morning of surgery -----Norvasc  Diabetic medication -----  Hold ASA for 7 days prior to procedure per surgeon instructions, patient verbalized understanding.  GLP1 agonist last dose: GLP1 instructions:  Patient instructed no nail polish to be worn day of surgery Patient instructed to bring photo id and insurance card day of surgery Patient aware to have Driver (ride ) / caregiver    for 24 hours after surgery - Daughter [name] Patient Special Instructions ----- shower with antibacterial soap. Pre-Op special Instructions -----  Patient verbalized understanding of instructions that were given at this phone interview. Patient denies chest pain, sob, fever, cough at the interview.

## 2023-12-07 ENCOUNTER — Telehealth: Payer: Self-pay | Admitting: Obstetrics

## 2023-12-07 ENCOUNTER — Encounter (HOSPITAL_COMMUNITY): Payer: Self-pay | Admitting: Obstetrics

## 2023-12-07 ENCOUNTER — Ambulatory Visit (HOSPITAL_COMMUNITY)
Admission: RE | Admit: 2023-12-07 | Discharge: 2023-12-07 | Disposition: A | Discharge status: Home / Self Care | Attending: Obstetrics | Admitting: Obstetrics

## 2023-12-07 ENCOUNTER — Other Ambulatory Visit: Payer: Self-pay

## 2023-12-07 ENCOUNTER — Ambulatory Visit (HOSPITAL_COMMUNITY): Admitting: Anesthesiology

## 2023-12-07 ENCOUNTER — Encounter (HOSPITAL_COMMUNITY)
Admission: RE | Disposition: A | Discharge status: Home / Self Care | Payer: Self-pay | Source: Home / Self Care | Attending: Obstetrics

## 2023-12-07 ENCOUNTER — Ambulatory Visit (HOSPITAL_BASED_OUTPATIENT_CLINIC_OR_DEPARTMENT_OTHER): Admitting: Anesthesiology

## 2023-12-07 DIAGNOSIS — N898 Other specified noninflammatory disorders of vagina: Secondary | ICD-10-CM | POA: Insufficient documentation

## 2023-12-07 DIAGNOSIS — R350 Frequency of micturition: Secondary | ICD-10-CM

## 2023-12-07 DIAGNOSIS — N812 Incomplete uterovaginal prolapse: Secondary | ICD-10-CM | POA: Diagnosis not present

## 2023-12-07 DIAGNOSIS — N891 Moderate vaginal dysplasia: Secondary | ICD-10-CM | POA: Diagnosis not present

## 2023-12-07 DIAGNOSIS — N893 Dysplasia of vagina, unspecified: Secondary | ICD-10-CM

## 2023-12-07 DIAGNOSIS — N393 Stress incontinence (female) (male): Secondary | ICD-10-CM | POA: Insufficient documentation

## 2023-12-07 DIAGNOSIS — N8110 Cystocele, unspecified: Secondary | ICD-10-CM

## 2023-12-07 DIAGNOSIS — I10 Essential (primary) hypertension: Secondary | ICD-10-CM | POA: Insufficient documentation

## 2023-12-07 DIAGNOSIS — Z79899 Other long term (current) drug therapy: Secondary | ICD-10-CM | POA: Diagnosis not present

## 2023-12-07 HISTORY — PX: CYSTOSCOPY: SHX5120

## 2023-12-07 HISTORY — PX: INJECTION, BULKING AGENT, URETHRA: SHX7596

## 2023-12-07 HISTORY — PX: VAGINECTOMY, PARTIAL: SHX6846

## 2023-12-07 HISTORY — PX: RECTOCELE REPAIR: SHX761

## 2023-12-07 HISTORY — PX: PERINEOPLASTY: SHX2218

## 2023-12-07 LAB — BASIC METABOLIC PANEL WITH GFR
Anion gap: 10 (ref 5–15)
BUN: 14 mg/dL (ref 8–23)
CO2: 23 mmol/L (ref 22–32)
Calcium: 9.4 mg/dL (ref 8.9–10.3)
Chloride: 100 mmol/L (ref 98–111)
Creatinine, Ser: 0.78 mg/dL (ref 0.44–1.00)
GFR, Estimated: >60 mL/min (ref >60)
Glucose, Bld: 112 mg/dL — ABNORMAL HIGH (ref 70–99)
Potassium: 3.4 mmol/L — ABNORMAL LOW (ref 3.5–5.1)
Sodium: 133 mmol/L — ABNORMAL LOW (ref 135–145)

## 2023-12-07 SURGERY — VAGINECTOMY, PARTIAL
Anesthesia: General | Site: Vagina

## 2023-12-07 MED ORDER — ACETAMINOPHEN 500 MG PO TABS
1000.0000 mg | ORAL_TABLET | ORAL | Status: AC
  Administered 2023-12-07: 1000 mg via ORAL

## 2023-12-07 MED ORDER — GABAPENTIN 300 MG PO CAPS
ORAL_CAPSULE | ORAL | Status: DC
Start: 2023-12-07 — End: 2023-12-07
  Filled 2023-12-07: qty 1

## 2023-12-07 MED ORDER — FENTANYL CITRATE (PF) 250 MCG/5ML IJ SOLN
INTRAMUSCULAR | Status: DC | PRN
Start: 2023-12-07 — End: 2023-12-07
  Administered 2023-12-07: 25 ug via INTRAVENOUS
  Administered 2023-12-07: 100 ug via INTRAVENOUS

## 2023-12-07 MED ORDER — LIDOCAINE-EPINEPHRINE 1 %-1:100000 IJ SOLN
INTRAMUSCULAR | Status: DC | PRN
  Administered 2023-12-07: 40 mL

## 2023-12-07 MED ORDER — CHLORHEXIDINE GLUCONATE 0.12 % MT SOLN
15.0000 mL | Freq: Once | OROMUCOSAL | Status: AC
  Administered 2023-12-07: 15 mL via OROMUCOSAL

## 2023-12-07 MED ORDER — SODIUM CHLORIDE 0.9 % IV SOLN
2.0000 g | INTRAVENOUS | Status: AC
  Administered 2023-12-07: 2 g via INTRAVENOUS
  Filled 2023-12-07: qty 2

## 2023-12-07 MED ORDER — ONDANSETRON HCL 4 MG/2ML IJ SOLN
INTRAMUSCULAR | Status: AC
Start: 2023-12-07 — End: ?
  Filled 2023-12-07: qty 2

## 2023-12-07 MED ORDER — PHENYLEPHRINE HCL-NACL 20-0.9 MG/250ML-% IV SOLN
INTRAVENOUS | Status: DC | PRN
  Administered 2023-12-07 (×2): 80 ug via INTRAVENOUS
  Administered 2023-12-07 (×2): 120 ug via INTRAVENOUS
  Administered 2023-12-07: 80 ug via INTRAVENOUS

## 2023-12-07 MED ORDER — ONDANSETRON HCL 4 MG/2ML IJ SOLN
INTRAMUSCULAR | Status: AC
  Filled 2023-12-07: qty 2

## 2023-12-07 MED ORDER — PHENAZOPYRIDINE HCL 100 MG PO TABS
ORAL_TABLET | ORAL | Status: AC
  Filled 2023-12-07: qty 2

## 2023-12-07 MED ORDER — ORAL CARE MOUTH RINSE: 15.0000 mL | Freq: Once | OROMUCOSAL | Status: AC

## 2023-12-07 MED ORDER — OXYCODONE HCL 5 MG PO TABS: 5.0000 mg | ORAL_TABLET | Freq: Once | ORAL | Status: DC | PRN

## 2023-12-07 MED ORDER — OXYCODONE HCL 5 MG/5ML PO SOLN: 5.0000 mg | Freq: Once | ORAL | Status: DC | PRN

## 2023-12-07 MED ORDER — ENOXAPARIN SODIUM 40 MG/0.4ML IJ SOSY
40.0000 mg | PREFILLED_SYRINGE | INTRAMUSCULAR | Status: AC
  Administered 2023-12-07: 40 mg via SUBCUTANEOUS

## 2023-12-07 MED ORDER — PHENYLEPHRINE 80 MCG/ML (10ML) SYRINGE FOR IV PUSH (FOR BLOOD PRESSURE SUPPORT)
PREFILLED_SYRINGE | INTRAVENOUS | Status: AC
  Filled 2023-12-07: qty 10

## 2023-12-07 MED ORDER — DEXAMETHASONE SODIUM PHOSPHATE 10 MG/ML IJ SOLN
INTRAMUSCULAR | Status: AC
Start: 2023-12-07 — End: ?
  Filled 2023-12-07: qty 1

## 2023-12-07 MED ORDER — PROPOFOL 10 MG/ML IV BOLUS
INTRAVENOUS | Status: AC
  Filled 2023-12-07: qty 20

## 2023-12-07 MED ORDER — LACTATED RINGERS IV SOLN
INTRAVENOUS | Status: DC
  Administered 2023-12-07: 1000 mL via INTRAVENOUS

## 2023-12-07 MED ORDER — MIDAZOLAM HCL 2 MG/2ML IJ SOLN
INTRAMUSCULAR | Status: AC
  Filled 2023-12-07: qty 2

## 2023-12-07 MED ORDER — MIDAZOLAM HCL 2 MG/2ML IJ SOLN: 0.5000 mg | Freq: Once | INTRAMUSCULAR | Status: DC | PRN

## 2023-12-07 MED ORDER — CHLORHEXIDINE GLUCONATE 0.12 % MT SOLN
OROMUCOSAL | Status: AC
  Filled 2023-12-07: qty 15

## 2023-12-07 MED ORDER — DEXAMETHASONE SODIUM PHOSPHATE 10 MG/ML IJ SOLN
INTRAMUSCULAR | Status: DC | PRN
Start: 2023-12-07 — End: 2023-12-07
  Administered 2023-12-07: 10 mg via INTRAVENOUS

## 2023-12-07 MED ORDER — ROCURONIUM BROMIDE 10 MG/ML (PF) SYRINGE
PREFILLED_SYRINGE | INTRAVENOUS | Status: AC
  Filled 2023-12-07: qty 10

## 2023-12-07 MED ORDER — LIDOCAINE 2% (20 MG/ML) 5 ML SYRINGE
INTRAMUSCULAR | Status: AC
  Filled 2023-12-07: qty 5

## 2023-12-07 MED ORDER — HYDROMORPHONE HCL 1 MG/ML IJ SOLN: 0.2500 mg | INTRAMUSCULAR | Status: DC | PRN

## 2023-12-07 MED ORDER — LIDOCAINE 2% (20 MG/ML) 5 ML SYRINGE
INTRAMUSCULAR | Status: DC | PRN
  Administered 2023-12-07: 60 mg via INTRAVENOUS

## 2023-12-07 MED ORDER — ONDANSETRON HCL 4 MG/2ML IJ SOLN
INTRAMUSCULAR | Status: DC | PRN
  Administered 2023-12-07: 4 mg via INTRAVENOUS

## 2023-12-07 MED ORDER — LIDOCAINE-EPINEPHRINE 1 %-1:100000 IJ SOLN
INTRAMUSCULAR | Status: AC
  Filled 2023-12-07: qty 2

## 2023-12-07 MED ORDER — PROPOFOL 10 MG/ML IV BOLUS
INTRAVENOUS | Status: DC | PRN
Start: 2023-12-07 — End: 2023-12-07
  Administered 2023-12-07: 10 mg via INTRAVENOUS
  Administered 2023-12-07: 130 mg via INTRAVENOUS

## 2023-12-07 MED ORDER — DEXAMETHASONE SODIUM PHOSPHATE 10 MG/ML IJ SOLN
INTRAMUSCULAR | Status: AC
  Filled 2023-12-07: qty 1

## 2023-12-07 MED ORDER — PHENAZOPYRIDINE HCL 100 MG PO TABS
200.0000 mg | ORAL_TABLET | ORAL | Status: AC
  Administered 2023-12-07: 200 mg via ORAL

## 2023-12-07 MED ORDER — STERILE WATER FOR IRRIGATION IR SOLN
Status: DC | PRN
  Administered 2023-12-07: 1000 mL

## 2023-12-07 MED ORDER — SODIUM CHLORIDE 0.9 % IR SOLN
Status: DC | PRN
  Administered 2023-12-07: 1000 mL via INTRAVESICAL

## 2023-12-07 MED ORDER — ROCURONIUM BROMIDE 10 MG/ML (PF) SYRINGE
PREFILLED_SYRINGE | INTRAVENOUS | Status: DC | PRN
Start: 2023-12-07 — End: 2023-12-07
  Administered 2023-12-07: 120 mg via INTRAVENOUS

## 2023-12-07 MED ORDER — MIDAZOLAM HCL 5 MG/5ML IJ SOLN
INTRAMUSCULAR | Status: DC | PRN
  Administered 2023-12-07: 2 mg via INTRAVENOUS

## 2023-12-07 MED ORDER — ACETAMINOPHEN 500 MG PO TABS
ORAL_TABLET | ORAL | Status: AC
  Filled 2023-12-07: qty 2

## 2023-12-07 MED ORDER — GABAPENTIN 300 MG PO CAPS
300.0000 mg | ORAL_CAPSULE | ORAL | Status: AC
  Administered 2023-12-07: 300 mg via ORAL

## 2023-12-07 MED ORDER — FENTANYL CITRATE (PF) 250 MCG/5ML IJ SOLN
INTRAMUSCULAR | Status: AC
  Filled 2023-12-07: qty 5

## 2023-12-07 MED ORDER — ENOXAPARIN SODIUM 40 MG/0.4ML IJ SOSY
PREFILLED_SYRINGE | INTRAMUSCULAR | Status: AC
  Filled 2023-12-07: qty 0.4

## 2023-12-07 MED ORDER — SUGAMMADEX SODIUM 200 MG/2ML IV SOLN
INTRAVENOUS | Status: DC | PRN
  Administered 2023-12-07: 200 mg via INTRAVENOUS

## 2023-12-07 MED ORDER — 0.9 % SODIUM CHLORIDE (POUR BTL) OPTIME
TOPICAL | Status: DC | PRN
  Administered 2023-12-07: 1000 mL

## 2023-12-07 MED ORDER — MEPERIDINE HCL 25 MG/ML IJ SOLN: 6.2500 mg | INTRAMUSCULAR | Status: DC | PRN

## 2023-12-07 SURGICAL SUPPLY — 45 items
BAG URO CATCHER STRL LF (MISCELLANEOUS) ×4 IMPLANT
BLADE SURG 15 STRL LF DISP TIS (BLADE) ×4 IMPLANT
CATH FOLEY 2WAY 3CC 8FR (CATHETERS) IMPLANT
CATH ROBINSON RED A/P 12FR (CATHETERS) IMPLANT
DRAPE HYSTEROSCOPY (MISCELLANEOUS) ×4 IMPLANT
ELECTRODE REM PT RTRN 9FT ADLT (ELECTROSURGICAL) IMPLANT
GAUZE 4X4 16PLY ~~LOC~~+RFID DBL (SPONGE) IMPLANT
GLOVE BIOGEL PI IND STRL 6 (GLOVE) ×4 IMPLANT
GLOVE BIOGEL PI MICRO STRL 5.5 (GLOVE) ×4 IMPLANT
GLOVE ECLIPSE 6.0 STRL STRAW (GLOVE) IMPLANT
GLOVE SS PI 5.5 STRL (GLOVE) ×8 IMPLANT
GOWN STRL REUS W/ TWL LRG LVL3 (GOWN DISPOSABLE) ×4 IMPLANT
GOWN STRL SURGICAL XL XLNG (GOWN DISPOSABLE) IMPLANT
HIBICLENS CHG 4% 4OZ BTL (MISCELLANEOUS) ×4 IMPLANT
HOLDER FOLEY CATH W/STRAP (MISCELLANEOUS) IMPLANT
IV NS 1000ML BAXH (IV SOLUTION) IMPLANT
KIT TURNOVER KIT B (KITS) ×4 IMPLANT
MANIFOLD NEPTUNE II (INSTRUMENTS) ×4 IMPLANT
NDL ASPIRATION 22 (NEEDLE) IMPLANT
NDL HYPO 22X1.5 SAFETY MO (MISCELLANEOUS) ×4 IMPLANT
NEEDLE ASPIRATION 22 (NEEDLE) IMPLANT
NEEDLE HYPO 22X1.5 SAFETY MO (MISCELLANEOUS) ×4 IMPLANT
NS IRRIG 1000ML POUR BTL (IV SOLUTION) ×4 IMPLANT
PACK CYSTO (CUSTOM PROCEDURE TRAY) ×4 IMPLANT
PACK VAGINAL WOMENS (CUSTOM PROCEDURE TRAY) ×4 IMPLANT
RETRACTOR LONE STAR DISPOSABLE (INSTRUMENTS) ×4 IMPLANT
RETRACTOR STAY HOOK 5MM (MISCELLANEOUS) ×4 IMPLANT
SET CYSTO W/LG BORE CLAMP LF (SET/KITS/TRAYS/PACK) ×4 IMPLANT
SLEEVE SCD COMPRESS KNEE MED (STOCKING) ×4 IMPLANT
SPIKE FLUID TRANSFER (MISCELLANEOUS) IMPLANT
SUCTION TUBE FRAZIER 10FR DISP (SUCTIONS) ×4 IMPLANT
SURGIFLO W/THROMBIN 8M KIT (HEMOSTASIS) IMPLANT
SUT MON AB 2-0 SH 27 (SUTURE) IMPLANT
SUT PDS AB 2-0 CT2 27 (SUTURE) ×4 IMPLANT
SUT VIC AB 2-0 SH 27XBRD (SUTURE) ×4 IMPLANT
SUT VIC AB 3-0 SH 18 (SUTURE) IMPLANT
SUT VIC AB 3-0 SH 27X BRD (SUTURE) IMPLANT
SUT VICRYL 2-0 SH 8X27 (SUTURE) IMPLANT
SYR BULB EAR ULCER 3OZ GRN STR (SYRINGE) ×4 IMPLANT
SYSTEM URETHRAL BULK BULKAMID (Female Continence) IMPLANT
TOWEL GREEN STERILE (TOWEL DISPOSABLE) ×4 IMPLANT
TOWEL GREEN STERILE FF (TOWEL DISPOSABLE) ×4 IMPLANT
TRAY FOLEY W/BAG SLVR 14FR LF (SET/KITS/TRAYS/PACK) IMPLANT
TUBE CONNECTING 12X1/4 (SUCTIONS) IMPLANT
WATER STERILE IRR 1000ML POUR (IV SOLUTION) IMPLANT

## 2023-12-07 NOTE — Discharge Instructions (Signed)

## 2023-12-07 NOTE — Anesthesia Preprocedure Evaluation (Addendum)
 Anesthesia Evaluation  Patient identified by MRN, date of birth, ID band Patient awake    Reviewed: Allergy & Precautions, NPO status , Patient's Chart, lab work & pertinent test results  History of Anesthesia Complications Negative for: history of anesthetic complications  Airway Mallampati: I  TM Distance: >3 FB Neck ROM: Full    Dental  (+) Dental Advisory Given   Pulmonary neg pulmonary ROS   breath sounds clear to auscultation       Cardiovascular hypertension, Pt. on medications (-) angina  Rhythm:Regular Rate:Normal  '24 ECHO: EF  70 to 75%.  1. The LV has hyperdynamic function, no regional wall motion abnormalities. There is mild LVH.  Grade I diastolic dysfunction (impaired relaxation).  2. RVF is normal. The right ventricular size is normal. There is normal pulmonary artery systolic pressure. The estimated right ventricular systolic pressure is 32.8 mmHg.  3. The mitral valve is grossly normal. Mild MR. No evidence of mitral stenosis.  4. The aortic valve is tricuspid. There is mild calcification of the aortic valve. There is mild thickening of the aortic valve. Aortic valve regurgitation is not visualized. Mild aortic valve stenosis. Aortic valve area, by VTI measures 1.33 cm, mean gradient measures 16.2 mmHg. Aortic valve Vmax measures 2.71 m/s.    Neuro/Psych   Anxiety     negative neurological ROS     GI/Hepatic negative GI ROS, Neg liver ROS,,,  Endo/Other  negative endocrine ROS    Renal/GU negative Renal ROS     Musculoskeletal  (+) Arthritis ,    Abdominal   Peds  Hematology negative hematology ROS (+)   Anesthesia Other Findings   Reproductive/Obstetrics                             Anesthesia Physical Anesthesia Plan  ASA: 2  Anesthesia Plan: General   Post-op Pain Management: Tylenol  PO (pre-op)*   Induction: Intravenous  PONV Risk Score and Plan: 3 and  Ondansetron , Dexamethasone  and Treatment may vary due to age or medical condition  Airway Management Planned: Oral ETT  Additional Equipment: None  Intra-op Plan:   Post-operative Plan: Extubation in OR  Informed Consent: I have reviewed the patients History and Physical, chart, labs and discussed the procedure including the risks, benefits and alternatives for the proposed anesthesia with the patient or authorized representative who has indicated his/her understanding and acceptance.     Dental advisory given  Plan Discussed with: CRNA and Surgeon  Anesthesia Plan Comments:        Anesthesia Quick Evaluation

## 2023-12-07 NOTE — Anesthesia Procedure Notes (Signed)
 Procedure Name: Intubation Date/Time: 12/07/2023 12:33 PM  Performed by: Adolphus Hoops, CRNAPre-anesthesia Checklist: Patient identified, Emergency Drugs available, Suction available and Patient being monitored Patient Re-evaluated:Patient Re-evaluated prior to induction Oxygen Delivery Method: Circle System Utilized Preoxygenation: Pre-oxygenation with 100% oxygen Induction Type: IV induction Ventilation: Mask ventilation without difficulty Laryngoscope Size: Mac and 3 Grade View: Grade I Tube type: Oral Tube size: 7.0 mm Number of attempts: 1 Airway Equipment and Method: Stylet and Oral airway Placement Confirmation: ETT inserted through vocal cords under direct vision, positive ETCO2 and breath sounds checked- equal and bilateral Secured at: 21 cm Tube secured with: Tape Dental Injury: Teeth and Oropharynx as per pre-operative assessment

## 2023-12-07 NOTE — Transfer of Care (Signed)
 Immediate Anesthesia Transfer of Care Note  Patient: Julia Sanchez  Procedure(s) Performed: VAGINECTOMY, PARTIAL (Vagina ) COLPORRHAPHY, POSTERIOR, FOR RECTOCELE REPAIR (Vagina ) PERINEOPLASTY (Perineum) INJECTION, BULKING AGENT, URETHRA (Urethra) CYSTOSCOPY (Bladder)  Patient Location: PACU  Anesthesia Type:General  Level of Consciousness: awake  Airway & Oxygen Therapy: Patient Spontanous Breathing and Patient connected to face mask oxygen  Post-op Assessment: Report given to RN and Post -op Vital signs reviewed and stable  Post vital signs: Reviewed and stable  Last Vitals:  Vitals Value Taken Time  BP 122/99 12/07/23 1525  Temp    Pulse 85 12/07/23 1527  Resp 20 12/07/23 1527  SpO2 98 % 12/07/23 1527  Vitals shown include unfiled device data.  Last Pain:  Vitals:   12/07/23 0942  TempSrc: Oral  PainSc: 0-No pain      Patients Stated Pain Goal: 6 (12/07/23 0942)  Complications: No notable events documented.

## 2023-12-07 NOTE — Op Note (Signed)
 Operative Note  Preoperative Diagnosis:  Stage II pelvic organ prolapse, VAIN II-III, urinary frequency, stress urinary incontinence  Postoperative Diagnosis: Stage III pelvic organ prolapse, VAIN II-III, urinary frequency, stress urinary incontinence  Procedures performed:  Colpocleisis, posterior repair, perineorrhaphy, levator plication, urethral bulking, cystourethroscopy  Implants: * No implants in log *  Attending Surgeon: Malinda Scurry, MD  Assistant Surgeon: n/a  Assistant:   Anesthesia: General endotracheal  Findings: 1. On vaginal exam, stage III prolapse present  2. On cystoscopy, normal bladder and urethral mucosa without injury or lesion. Brisk bilateral ureteral efflux present.    Specimens:  ID Type Source Tests Collected by Time Destination  1 :  Tissue Path Tissue SURGICAL PATHOLOGY Wyonia Hefty T, MD 12/07/2023 1007     Estimated blood loss: 50 mL  IV fluids: 1500 mL  Urine output: 200 mL  Complications: none  Procedure in Detail: The patient was taken to the operating room where she was placed under anesthesia.  She was then placed in the dorsal lithotomy position, taking care to avoid traction on the extremities. She was then prepped and draped in the usual sterile fashion.  A self-retaining lonestar retractor was placed using four elastic blue stays.  After a foley catheter was inserted into the urethra, the location of the midurethra was palpated. Two Allis clamps were placed at the vaginal apex. 1% lidocaine  with epinephrine was injected into the vaginal mucosa circumferentially.  A marking pen was used to delineate the circle of vaginal mucosa to remove from the apex to approximately location of the bladder neck or 3cm from the hymen.  An incision was made with a 15 blade scalpel along these marked lines. After an Allis clamp was placed on the vaginal mucosa, the underlying vaginal muscularis was dissected off the mucosa.  The vaginal mucosa was excised off in  one piece with the apex marked with an interrupted suture due to history of VAIN II-III. Excellent hemostasis was obtained with cautery.  Serial pursestring sutures were used to reduce the vaginal vault prolapse using 2-0 PDS x 3. Anterior and posterior plication sutures were placed to reapproximate the epithelium. The mucosa was then closed with a 2-0 vicryl suture in a running fashion.   The foley catheter was removed.  A 70-degree cystoscope was introduced, and 360-degree inspection revealed no trauma in the bladder, with bilateral ureteral efflux.  The bladder was drained and the cystoscope was removed.  A 0 degree cystoscope was inserted into the urethra into the bladder.  The needle was primed.  The cystoscope was inserted to the level of the bladder neck.  The needle was inserted 2 cm and the scope was pulled back into the urethra 2 cm.  The needle was inserted bevel up at the 5 o'clock position and the Bulkamid was injected to obtain coaptation.  This was repeated at the 12 o'clock,  10 o'clock and 7 o'clock positions.   A total of 2 1ml syringes were used. and good circumferential coaptation was noted. An 8 Fr Foley catheter was reinserted.   Attention was then turned to the posterior vagina.  Two Allis clamps were placed at the introitus approximately 1-2cm from the urethra meatus allowing for visualization of the urethral meatus. Additional 1% lidocaine  with epinephrine was injected into the vaginal mucosa in the posterior vaginal wall and perineum for hydrodissection and hemostasis. A diamond shape incision was made between these clamps with a 15 blade scalpel and a diamond shaped area of epithelium was  cut at the introitus.  The rectovaginal septum was then dissected off the vaginal mucosa bilaterally. The rectovaginal septum was then plicated in a continuous running fashion with 2-0 PDS while one finger was placed in the rectum to prevent rectal penetration.  Levator plication was performed with  2-0 PDS suture to reapproximate the levator ani muscles. After placement of the first plication stitch, easily visualization of the urethral meatus was noted.  The suture incorporated the perineal body in a U stitch fashion and the bulbocavernosus muscles. A 2-0 Vicryl was used in a subcuticular fashion to re-approximate the hymenal ring. After plication, the excess vaginal mucosa was trimmed and the vaginal mucosa was reapproximated using 2-0 Vicryl sutures.  The vagina re-examined and hemostasis was noted. A rectal examination was normal and confirmed no sutures within the rectum.    The patient tolerated the procedure well.  She was awakened from anesthesia and transferred to the recovery room in stable condition. All counts were correct x 2.

## 2023-12-07 NOTE — Interval H&P Note (Signed)
 History and Physical Interval Note:  12/07/2023 11:47 AM  Julia Sanchez  has presented today for surgery, with the diagnosis of anterior vaginal prolapse; vaginal lesion; stress urinary incontinence.  The various methods of treatment have been discussed with the patient and family. After consideration of risks, benefits and other options for treatment, the patient has consented to  Procedure(s) with comments: VAGINECTOMY, PARTIAL (N/A) - partial colpecotomy; total time requested is 3 hours COLPORRHAPHY, POSTERIOR, FOR RECTOCELE REPAIR (N/A) PERINEOPLASTY (N/A) - perineorrhaphy INJECTION, BULKING AGENT, URETHRA (N/A) CYSTOSCOPY (N/A) as a surgical intervention.  The patient's history has been reviewed, patient examined, no change in status, stable for surgery.  I have reviewed the patient's chart and labs.  Questions were answered to the patient's satisfaction.    09/05/23 vaginal colposcopy with acetic acid, no abnormal finding or biopsy obtained by Dr. Steve El.  Samai Corea Edgardo Goodwill

## 2023-12-08 ENCOUNTER — Telehealth: Payer: Self-pay

## 2023-12-08 ENCOUNTER — Encounter (HOSPITAL_COMMUNITY): Payer: Self-pay | Admitting: Obstetrics

## 2023-12-08 ENCOUNTER — Ambulatory Visit

## 2023-12-08 VITALS — BP 145/72 | HR 89

## 2023-12-08 DIAGNOSIS — N3289 Other specified disorders of bladder: Secondary | ICD-10-CM

## 2023-12-08 DIAGNOSIS — R339 Retention of urine, unspecified: Secondary | ICD-10-CM

## 2023-12-08 NOTE — Anesthesia Postprocedure Evaluation (Signed)
 Anesthesia Post Note  Patient: NOLANA FARINACCI  Procedure(s) Performed: VAGINECTOMY, PARTIAL (Vagina ) COLPORRHAPHY, POSTERIOR, FOR RECTOCELE REPAIR (Vagina ) PERINEOPLASTY (Perineum) INJECTION, BULKING AGENT, URETHRA (Urethra) CYSTOSCOPY (Bladder)     Patient location during evaluation: PACU Anesthesia Type: General Level of consciousness: awake and alert Pain management: pain level controlled Vital Signs Assessment: post-procedure vital signs reviewed and stable Respiratory status: spontaneous breathing, nonlabored ventilation, respiratory function stable and patient connected to nasal cannula oxygen Cardiovascular status: blood pressure returned to baseline and stable Postop Assessment: no apparent nausea or vomiting Anesthetic complications: no   No notable events documented.  Last Vitals:  Vitals:   12/07/23 1545 12/07/23 1555  BP: (!) 134/55   Pulse: 84 88  Resp: 12 (!) 21  Temp: 36.4 C   SpO2: 95% 92%    Last Pain:  Vitals:   12/07/23 1555  TempSrc:   PainSc: 0-No pain                 Leslye Rast

## 2023-12-08 NOTE — Telephone Encounter (Signed)
 Patient has been contacted. She has not had much urine output 10-15 cc. She will come in at 1:30 today for a PVR.

## 2023-12-08 NOTE — Telephone Encounter (Signed)
 Spoke to the patient this morning and was scheduled for a nurse visit due to urinary retention from removing her foley cath.

## 2023-12-08 NOTE — Addendum Note (Signed)
 Addendum  created 12/08/23 1528 by Leslye Rast, MD   Attestation recorded in Intraprocedure, Intraprocedure Attestations filed

## 2023-12-08 NOTE — Patient Instructions (Signed)
 Keep all scheduled follow up appointments.   It was a pleasure to see you today!  Thank you for trusting me with your care!

## 2023-12-08 NOTE — Progress Notes (Signed)
 Patient removed her foley cath at 8am with the assistance of her daughter. I called the patient at 11:40am. She had not had much urine output 10-15 cc. She denied any discomfort or pain. Nurse visit was scheduled and pt came to the office at 1:30pm. When she came she was in pain, complains of bladder spasms .PVR machine showed 89ml.  A 12 french foley cath was placed by K.Zuleta and assistance from [place]. Total PVR from cath placement was . Patient left and will return Monday May 5th for a voiding trial.

## 2023-12-09 LAB — SURGICAL PATHOLOGY

## 2023-12-12 ENCOUNTER — Ambulatory Visit

## 2023-12-12 NOTE — Patient Instructions (Signed)
 Julia Sanchez

## 2023-12-12 NOTE — Progress Notes (Addendum)
 Julia Sanchez is a 78 y.o. female Is here today for a voiding trial.  12 fr cath was removed with no complications.  Instilled: 250 ml Voided: 125 Patient will call the office if she does not have any urine output.   [name]  underwent Vaginectomy, Partial, Colporrhaphy, Posterior, For Rectocele Repair, Perineoplasty, Injection, Bulking Agent, Urethra, and Cystoscopy  on 12/07/2023  with [] Dr Frutoso Jing [x] Dr Aron Lard.  The patient reports that her pain is controlled.  She is taking [] No Medication [x] Acetaminophen  500mg  every 6 hours [x] Ibuprofen  600mg  every 6 hours or []  Prescribed Narcotic.  Her pain level is 1[x] with medication [] Without medication is.   She reports vaginal bleeding, light spotting. The patient is tolerating PO fluids and solids. She has had a bowel movement and is taking Miralax for a bowel regimen. She is not passing gas.  She was discharged with a catheter.    [x]  Patient was discharged without a catheter, the patient is not having any concerns at this time. She does not having any additional questions.  Reviewed Post operative instructions as needed to answer additional questions.   CC'd note to patient's provider.   No complaints or concerns before patient left the office.

## 2023-12-13 ENCOUNTER — Emergency Department (HOSPITAL_COMMUNITY)
Admission: EM | Admit: 2023-12-13 | Discharge: 2023-12-13 | Disposition: A | Discharge status: Home / Self Care | Attending: Emergency Medicine | Admitting: Emergency Medicine

## 2023-12-13 ENCOUNTER — Telehealth: Payer: Self-pay

## 2023-12-13 ENCOUNTER — Other Ambulatory Visit: Payer: Self-pay

## 2023-12-13 ENCOUNTER — Encounter (HOSPITAL_COMMUNITY): Payer: Self-pay | Admitting: Emergency Medicine

## 2023-12-13 DIAGNOSIS — R339 Retention of urine, unspecified: Secondary | ICD-10-CM | POA: Diagnosis present

## 2023-12-13 DIAGNOSIS — Z7982 Long term (current) use of aspirin: Secondary | ICD-10-CM | POA: Insufficient documentation

## 2023-12-13 NOTE — ED Triage Notes (Signed)
 Patient coming to ED for evaluation of urinary retention s/p having foley catheter removed yesterday.  Had partial vaginectomy, colporrhaphy for rectocele repair, perineoplasty, and an injection of a bulking agent placed in urethra on 4/30.  Reports she has had a foley since surgery and has had intermittent urinary retention requiring foley placement.  Pt reports she was able to void after having catheter removed yesterday.  As the day progressed she was having difficulty void and this morning she has not been able to void.  States "its only dribbles."

## 2023-12-13 NOTE — ED Provider Notes (Signed)
 [facility] AT [facility] Provider Note   CSN: [id number] Arrival date & time: 12/13/23  1027     History  Chief Complaint  Patient presents with   Urinary Retention    Julia Sanchez is a 78 y.o. female.  Pt complains of urinary retention.  Patient reports she had a Foley catheter removed yesterday at Dr. Kelle Pate  Office.  Patient recently had surgery for her pelvic organ prolapse.  Patient reports that she was able to urinate it yesterday in the office after catheter was removed.  Patient states last p.m. she was unable to urinate and has become increasingly uncomfortable.  Patient denies any fever or chills she has not had any nausea or vomiting she denies abdominal pain she does feel pressure from her bladder.  The history is provided by the patient. No language interpreter was used.       Home Medications Prior to Admission medications   Medication Sig Start Date End Date Taking? Authorizing Provider  acetaminophen  (TYLENOL ) 500 MG tablet Take 1 tablet (500 mg total) by mouth every 6 (six) hours as needed (pain). Patient taking differently: Take 1,000 mg by mouth every 4 (four) hours as needed for mild pain (pain score 1-3) or moderate pain (pain score 4-6). 11/21/23  Yes Zuleta, Kaitlin G, NP  amitriptyline  (ELAVIL ) 50 MG tablet TAKE 1 TABLET BY MOUTH AT BEDTIME 09/01/23  Yes Roslyn Coombe, MD  amLODipine  (NORVASC ) 10 MG tablet Take 1 tablet (10 mg total) by mouth daily. 02/14/23  Yes Roslyn Coombe, MD  aspirin  EC 81 MG tablet Take 1 tablet (81 mg total) by mouth daily. 11/25/15  Yes Roslyn Coombe, MD  atorvastatin  (LIPITOR) 20 MG tablet Take 1 tablet (20 mg total) by mouth daily. 02/14/23  Yes Roslyn Coombe, MD  b complex vitamins capsule Take 1 capsule by mouth daily.   Yes [provider]  ibuprofen  (ADVIL ) 600 MG tablet Take 1 tablet (600 mg total) by mouth every 6 (six) hours as needed. Patient taking differently: Take 600 mg by mouth  every 4 (four) hours as needed for mild pain (pain score 1-3) or moderate pain (pain score 4-6). 11/21/23  Yes Zuleta, Kaitlin G, NP  oxyCODONE  (OXY IR/ROXICODONE ) 5 MG immediate release tablet Take 1 tablet (5 mg total) by mouth every 4 (four) hours as needed for severe pain (pain score 7-10). 11/21/23  Yes Zuleta, Kaitlin G, NP  phenazopyridine  (PYRIDIUM ) 200 MG tablet Take 1 tablet (200 mg total) by mouth 3 (three) times daily as needed for pain. 08/18/23  Yes Wyonia Hefty T, MD  potassium chloride  (KLOR-CON ) 10 MEQ tablet TAKE ONE TABLET BY MOUTH DAILY 02/14/23  Yes Roslyn Coombe, MD  telmisartan -hydrochlorothiazide  (MICARDIS  HCT) 80-25 MG tablet Take 1 tablet by mouth daily. 02/14/23  Yes Roslyn Coombe, MD  BESIVANCE 0.6 % SUSP Apply 1 drop to eye 3 (three) times daily. Patient not taking: Reported on 12/13/2023 11/30/23   [provider]  Bromfenac Sodium 0.07 % SOLN Apply 1 drop to eye 2 (two) times daily. Patient not taking: Reported on 12/13/2023 11/30/23   [provider]  Difluprednate 0.05 % EMUL INSTILL 1 DROP INTO OPERATIVE EYE THREE TIMES DAILY STARTING 3 DAYS PRIOR TO SURGERY AND CONTINUE FOR 1 WEEK AFTER SURGERY, THEN 2 TIMES A DAY FOR 1 WEEK, THEN 1 TIME A DAY FOR 1 WEEK. THEN STOP Patient not taking: Reported on 12/13/2023 11/30/23   [provider]  Allergies    Nitrofurantoin and Sulfonamide derivatives    Review of Systems   Review of Systems  All other systems reviewed and are negative.   Physical Exam Updated Vital Signs BP (!) 169/102   Pulse 98   Temp 97.8 F (36.6 C)   Resp 16   SpO2 98%  Physical Exam HENT:     Head: Normocephalic.  Cardiovascular:     Rate and Rhythm: Normal rate.  Pulmonary:     Effort: Pulmonary effort is normal.  Abdominal:     General: Abdomen is flat.  Skin:    General: Skin is warm.  Neurological:     General: No focal deficit present.     Mental Status: She is alert.     ED Results / Procedures / Treatments    Labs (all labs ordered are listed, but only abnormal results are displayed) Labs Reviewed - No data to display  EKG None  Radiology No results found.  Procedures Procedures    Medications Ordered in ED Medications - No data to display  ED Course/ Medical Decision Making/ A&P                                 Medical Decision Making Pt complains of not being able to urinate,  Pt had Foley removed yesterday    Risk Risk Details: Bladder scan over 700cc.  Foley placed.  Pt reports feeling much better.            Final Clinical Impression(s) / ED Diagnoses Final diagnoses:  Urinary retention    Rx / DC Orders ED Discharge Orders     None      An After Visit Summary was printed and given to the patient.    Sandi Crosby, PA-C 12/13/23 0981    Trish Furl, MD 12/14/23 [phone]

## 2023-12-13 NOTE — ED Notes (Signed)
 Pt reports instant relief from cramping upon catheter insertion.  Pt sitting upright resting comfortably at this time.   Pt using phone to keep in touch with family.   Warm blankets provided.

## 2023-12-13 NOTE — Telephone Encounter (Signed)
 Answering services called stating a post-op patient has been trying to get ahold of someone since 5 am stating she has had rectal and bladder spams.  Please Advise

## 2023-12-13 NOTE — Telephone Encounter (Signed)
 Can we get her scheduled for a follow up with Dr. Aron Lard for the catheter and retention?

## 2023-12-13 NOTE — Telephone Encounter (Signed)
 Spoke to patient and she is doing well. Cath that is placed is working and no complaints. Will keep her follow up as scheduleded

## 2023-12-14 ENCOUNTER — Encounter: Payer: Self-pay | Admitting: Obstetrics

## 2023-12-16 ENCOUNTER — Encounter: Payer: Self-pay | Admitting: Obstetrics

## 2023-12-16 ENCOUNTER — Telehealth: Payer: Self-pay | Admitting: *Deleted

## 2023-12-16 ENCOUNTER — Ambulatory Visit

## 2023-12-16 ENCOUNTER — Ambulatory Visit (INDEPENDENT_AMBULATORY_CARE_PROVIDER_SITE_OTHER): Admitting: Obstetrics

## 2023-12-16 DIAGNOSIS — R339 Retention of urine, unspecified: Secondary | ICD-10-CM | POA: Insufficient documentation

## 2023-12-16 NOTE — Patient Instructions (Signed)
 Continue to monitor urinary symptoms.   Please call and return to the office if you experience any change in urinary symptoms such as sensation of incomplete emptying, increased frequency/urgency, or leakage.

## 2023-12-16 NOTE — Telephone Encounter (Signed)
 TC from pt verified with DOB- pt seen this morning and was advised to call back at 230pm with voiding information.  Between 1140am-1245pm she took in 1000 volume of fluids and at 1245 voided , at 130 voided and at 150 voided .  Pt feels as if bladder is slow to wake up - since has had cath in for a week.  I spoke to Dr Aron Lard and she advised for the pt to come in for us  to do a cath PVR.  Pt informed and said would be here by 315pm. KD CMA

## 2023-12-16 NOTE — Progress Notes (Signed)
 [facility]  Date of Visit: 12/16/2023  History of Present Illness: Ms. Julia Sanchez is a 78 y.o. female scheduled today for a post-operative visit.   Surgery: s/p Colpocleisis, posterior repair, perineorrhaphy, levator plication, urethral bulking, cystourethroscopy on 12/07/23  She did not pass her postoperative void trial.   Postoperative course complicated by urinary retention.  Presented on POD#1 due to minimal UOP after foley removal at home with UOP when 12Fr foley was replaced.  POD#5 returned to office for repeat void trial and voided after retrograde filled for . Patient reports increased sensation of incomplete emptying and returned to ED on POD#6 with of UOP after 14 Fr foley placement.   Today she reports minimal pain. Denies using narcotics since POD#1 x 1 and denies UTI symptoms.   UTI in the last 6 weeks? No  Pain? Used 1 oxycodone  since surgery, well controlled with NSAIDs and Tylenol   She has returned to her normal activity (except for postop restrictions) Vaginal bulge? No  Bowel issues: continues miralax with regular daily bowel movements  Subjective Success: Do you usually have a bulge or something falling out that you can see or feel in the vaginal area? No  Retreatment Success: Any retreatment with surgery or pessary for any compartment? 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   Allergies: Patient is allergic to nitrofurantoin and sulfonamide derivatives.   Physical Exam: There were no vitals taken for this visit.  Abdomen: soft, non-tender, without masses or organomegaly Pelvic Examination:  deferred. No active bleeding or discharge noted.   Retrograde filled voiding trial:  After verbal consent was obtained from the patient for void trial to assess bladder emptying and the catheter was in place attached to leg bag with of clean yellow UOP.  Urine appeared clear yellow.  Bladder was backfill through 14 Fr catheter with saline until patient feels full at 300cc. Foley balloon was deflated and 7mL was withdrawn. The catheter was removed without difficulty. Patient voided into a hat.   The patient tolerated the procedure well.  ---------------------------------------------------------  Assessment and Plan:  1. Urinary retention [R33.9]    - Gen - minimal pain well controlled, tylenol /NSAIDs PRN moderate-severe pain - Cardio/pulm - no acute resp distress - GI - tolerating PO diet, denies N/V. Encouraged ambulation and oral intake - GU - Passed void trial with minimal bleeding. Continue to monitor for signs and symptoms of urinary retention and return around 2-3pm for repeat PVR due to prior urinary retention after passing void trial if she experiences any change in urinary symptoms. Discussed need to monitor for return of SUI.  - VTE - encouraged ambulation  - Pathology results were reviewed with the patient today and she verbalized understanding that the results were consistent with prior biopsy finding of VAIN II with clear margins. Discussed need for repeat vaginal exam by Dr. Steve El as discussed pre-op - Can resume regular activity and resume driving  if desired.  - Discussed avoidance of heavy lifting and straining long term to reduce the risk of recurrence.   All questions answered.   RTC on 01/17/24.

## 2023-12-19 ENCOUNTER — Other Ambulatory Visit (HOSPITAL_COMMUNITY)
Admission: RE | Admit: 2023-12-19 | Discharge: 2023-12-19 | Disposition: A | Discharge status: Home / Self Care | Source: Other Acute Inpatient Hospital | Attending: Obstetrics | Admitting: Obstetrics

## 2023-12-19 ENCOUNTER — Encounter: Payer: Self-pay | Admitting: Obstetrics

## 2023-12-19 ENCOUNTER — Ambulatory Visit (INDEPENDENT_AMBULATORY_CARE_PROVIDER_SITE_OTHER)

## 2023-12-19 VITALS — BP 146/76 | HR 79

## 2023-12-19 DIAGNOSIS — R319 Hematuria, unspecified: Secondary | ICD-10-CM

## 2023-12-19 DIAGNOSIS — R82998 Other abnormal findings in urine: Secondary | ICD-10-CM | POA: Insufficient documentation

## 2023-12-19 DIAGNOSIS — R339 Retention of urine, unspecified: Secondary | ICD-10-CM | POA: Insufficient documentation

## 2023-12-19 LAB — POCT URINALYSIS DIPSTICK
Bilirubin, UA: NEGATIVE
Glucose, UA: NEGATIVE
Ketones, UA: NEGATIVE
Nitrite, UA: POSITIVE
Protein, UA: POSITIVE — AB
Spec Grav, UA: <=1.005 — AB (ref 1.010–1.025)
Urobilinogen, UA: 0.2 U/dL
pH, UA: 6 (ref 5.0–8.0)

## 2023-12-19 LAB — URINALYSIS, ROUTINE W REFLEX MICROSCOPIC
Bilirubin Urine: NEGATIVE
Glucose, UA: NEGATIVE mg/dL
Ketones, ur: NEGATIVE mg/dL
Nitrite: POSITIVE — AB
Protein, ur: 30 mg/dL — AB
Specific Gravity, Urine: 1.008 (ref 1.005–1.030)
WBC, UA: >50 WBC/hpf (ref 0–5)
pH: 6 (ref 5.0–8.0)

## 2023-12-19 MED ORDER — TAMSULOSIN HCL 0.4 MG PO CAPS: 0.4000 mg | ORAL_CAPSULE | Freq: Every day | ORAL | 0 refills | Status: AC

## 2023-12-19 MED ORDER — CEPHALEXIN 500 MG PO CAPS: 500.0000 mg | ORAL_CAPSULE | Freq: Two times a day (BID) | ORAL | 0 refills | Status: AC

## 2023-12-19 NOTE — Progress Notes (Signed)
 Julia Sanchez is a 78 y.o. female here for a voiding trial.   16 french cath placed, drained .   Instilled: 300 Voided: 175  16 french cath was replaced and patient went home with no complaints.   She will follow up on Thursday 12-22-2023.

## 2023-12-19 NOTE — Patient Instructions (Signed)
 Repeat spontaneous void trial Thursday.   Start Flomax 1 tab daily.

## 2023-12-21 ENCOUNTER — Ambulatory Visit: Payer: Self-pay | Admitting: Obstetrics

## 2023-12-21 LAB — URINE CULTURE: Culture: >=100000 — AB

## 2023-12-22 ENCOUNTER — Ambulatory Visit

## 2024-01-14 ENCOUNTER — Other Ambulatory Visit: Payer: Self-pay | Admitting: Obstetrics

## 2024-01-14 DIAGNOSIS — R339 Retention of urine, unspecified: Secondary | ICD-10-CM

## 2024-01-17 ENCOUNTER — Encounter: Payer: Self-pay | Admitting: Obstetrics

## 2024-01-17 ENCOUNTER — Ambulatory Visit (INDEPENDENT_AMBULATORY_CARE_PROVIDER_SITE_OTHER): Admitting: Obstetrics

## 2024-01-17 VITALS — BP 162/81 | HR 85

## 2024-01-17 DIAGNOSIS — Z48816 Encounter for surgical aftercare following surgery on the genitourinary system: Secondary | ICD-10-CM

## 2024-01-17 DIAGNOSIS — Z09 Encounter for follow-up examination after completed treatment for conditions other than malignant neoplasm: Secondary | ICD-10-CM

## 2024-01-17 NOTE — Patient Instructions (Signed)
 For vaginal atrophy (thinning of the vaginal tissue that can cause dryness and burning) and UTI prevention we discussed estrogen replacement in the form of vaginal cream.   Start vaginal estrogen therapy 2 times weekly at night. This can be placed with your finger or an applicator inside the vagina and around the urethra.  Please let us  know if the prescription is too expensive and we can look for alternative options.   Is vaginal estrogen therapy safe for me? Vaginal estrogen preparations act on the vaginal skin, and only a very tiny amount is absorbed into the bloodstream (0.01%).  They work in a similar way to hand or face cream.  There is minimal absorption and they are therefore perfectly safe. If you have had breast cancer and have persistent troublesome symptoms which aren't settling with vaginal moisturisers and lubricants, local estrogen treatment may be a possibility, but consultation with your oncologist should take place first.

## 2024-01-17 NOTE — Progress Notes (Signed)
 [facility]  Date of Visit: 01/17/2024  History of Present Illness: Julia Sanchez is a 78 y.o. female scheduled today for a 6 weeks post-operative visit.   Surgery: s/p Colpocleisis, posterior repair, perineorrhaphy, levator plication, urethral bulking, cystourethroscopy on 12/07/23  She did not pass her postoperative void trial.   Postoperative course complicated by urinary retention.   Denies signs or symptoms of urinary retention, reports initial position change for voids the week after foley removal. Denies UTI symptoms, vaginal discharge, bleeding or prolapse symptoms.  Presented on POD#1 due to minimal UOP after foley removal at home with UOP when 12Fr foley was replaced.  POD#5 returned to office for repeat void trial and voided after retrograde filled for . Patient reports increased sensation of incomplete emptying and returned to ED on POD#6 with of UOP after 14 Fr foley placement.   UTI in the last 6 weeks? Yes, s/p Keflex  Pain? Used 1 oxycodone  since surgery, well controlled with NSAIDs and Tylenol   She has returned to her normal activity (except for postop restrictions) Vaginal bulge? No  Bowel issues: continues miralax with regular daily bowel movements  Subjective Success: Do you usually have a bulge or something falling out that you can see or feel in the vaginal area? No  Retreatment Success: Any retreatment with surgery or pessary for any compartment? 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   Allergies: Patient is allergic to nitrofurantoin and sulfonamide derivatives.   Physical  Exam: BP (!) 162/81   Pulse 85    Today's Vitals   01/17/24 1048 01/17/24 1121  BP: (!) 140/82 (!) 162/81  Pulse: (!) 101 85   There is no height or weight on file to calculate BMI.  Physical Exam Constitutional:      General: She is not in acute distress.    Appearance: Normal appearance.  Genitourinary:     Genitourinary Comments: Superficial skin separation, no erythema, pain with palpation or abnormal discharge     Right Labia: No rash, tenderness, lesions, skin changes or Bartholin's cyst.    Left Labia: No tenderness, lesions, skin changes, Bartholin's cyst or rash.    Cardiovascular:     Rate and Rhythm: Normal rate.  Pulmonary:     Effort: Pulmonary effort is normal. No respiratory distress.  Neurological:     Mental Status: She is alert.  Vitals reviewed. Exam conducted with a chaperone present.     ---------------------------------------------------------  Assessment and Plan:  1. Postop check    - Gen - denies pain or need for pain medication use - Cardio/pulm - no acute resp distress - GI - denies issues with PO diet, denies N/V. Continue to monitor bowel consistency - GU - Continue to monitor for signs and symptoms of urinary retention or if she experiences any change in urinary symptoms. Discussed need to monitor for return of SUI. Denies UTI symptoms Recheck UA at follow-up due to hematuria on prior UA. - can resume vaginal estrogen 1g 2x/week - asymptomatic superficial skin separation noted and reviewed finding with pt. Offered expectant mgmt vs. Silver nitrate, pt prefers expectant mgmt at this time, will reassess at 3 month follow-up  - VAIN II - clear margins. Discussed need for repeat vaginal exam by Dr. Steve El  -  Can resume regular activity - Discussed avoidance of heavy lifting and straining long term to reduce the risk of recurrence.   All questions answered.

## 2024-02-15 ENCOUNTER — Ambulatory Visit (INDEPENDENT_AMBULATORY_CARE_PROVIDER_SITE_OTHER): Payer: Medicare Other | Admitting: Internal Medicine

## 2024-02-15 ENCOUNTER — Ambulatory Visit: Payer: Self-pay | Admitting: Internal Medicine

## 2024-02-15 ENCOUNTER — Encounter: Payer: Self-pay | Admitting: Internal Medicine

## 2024-02-15 ENCOUNTER — Other Ambulatory Visit: Payer: Self-pay | Admitting: Internal Medicine

## 2024-02-15 VITALS — BP 132/80 | HR 78 | Temp 98.4°F | Ht 66.5 in | Wt 168.6 lb

## 2024-02-15 DIAGNOSIS — E538 Deficiency of other specified B group vitamins: Secondary | ICD-10-CM | POA: Diagnosis not present

## 2024-02-15 DIAGNOSIS — I10 Essential (primary) hypertension: Secondary | ICD-10-CM | POA: Diagnosis not present

## 2024-02-15 DIAGNOSIS — R739 Hyperglycemia, unspecified: Secondary | ICD-10-CM

## 2024-02-15 DIAGNOSIS — E7800 Pure hypercholesterolemia, unspecified: Secondary | ICD-10-CM | POA: Diagnosis not present

## 2024-02-15 DIAGNOSIS — E559 Vitamin D deficiency, unspecified: Secondary | ICD-10-CM

## 2024-02-15 DIAGNOSIS — M1711 Unilateral primary osteoarthritis, right knee: Secondary | ICD-10-CM | POA: Diagnosis not present

## 2024-02-15 LAB — URINALYSIS, ROUTINE W REFLEX MICROSCOPIC
Bilirubin Urine: NEGATIVE
Hgb urine dipstick: NEGATIVE
Ketones, ur: NEGATIVE
Leukocytes,Ua: NEGATIVE
Nitrite: NEGATIVE
RBC / HPF: NONE SEEN (ref 0–?)
Specific Gravity, Urine: 1.01 (ref 1.000–1.030)
Total Protein, Urine: NEGATIVE
Urine Glucose: NEGATIVE
Urobilinogen, UA: 0.2 (ref 0.0–1.0)
pH: 6.5 (ref 5.0–8.0)

## 2024-02-15 LAB — BASIC METABOLIC PANEL WITH GFR
BUN: 20 mg/dL (ref 6–23)
CO2: 29 meq/L (ref 19–32)
Calcium: 9.8 mg/dL (ref 8.4–10.5)
Chloride: 99 meq/L (ref 96–112)
Creatinine, Ser: 0.73 mg/dL (ref 0.40–1.20)
GFR: 77.79 mL/min (ref >60.00)
Glucose, Bld: 117 mg/dL — ABNORMAL HIGH (ref 70–99)
Potassium: 3.8 meq/L (ref 3.5–5.1)
Sodium: 135 meq/L (ref 135–145)

## 2024-02-15 LAB — CBC WITH DIFFERENTIAL/PLATELET
Basophils Absolute: 0 K/uL (ref 0.0–0.1)
Basophils Relative: 0.7 % (ref 0.0–3.0)
Eosinophils Absolute: 0.2 K/uL (ref 0.0–0.7)
Eosinophils Relative: 3.5 % (ref 0.0–5.0)
HCT: 36.8 % (ref 36.0–46.0)
Hemoglobin: 12.8 g/dL (ref 12.0–15.0)
Lymphocytes Relative: 19.3 % (ref 12.0–46.0)
Lymphs Abs: 1.2 K/uL (ref 0.7–4.0)
MCHC: 34.9 g/dL (ref 30.0–36.0)
MCV: 87 fl (ref 78.0–100.0)
Monocytes Absolute: 0.5 K/uL (ref 0.1–1.0)
Monocytes Relative: 8 % (ref 3.0–12.0)
Neutro Abs: 4.3 K/uL (ref 1.4–7.7)
Neutrophils Relative %: 68.5 % (ref 43.0–77.0)
Platelets: 395 K/uL (ref 150.0–400.0)
RBC: 4.22 Mil/uL (ref 3.87–5.11)
RDW: 12.9 % (ref 11.5–15.5)
WBC: 6.2 K/uL (ref 4.0–10.5)

## 2024-02-15 LAB — HEPATIC FUNCTION PANEL
ALT: 16 U/L (ref 0–35)
AST: 18 U/L (ref 0–37)
Albumin: 4.7 g/dL (ref 3.5–5.2)
Alkaline Phosphatase: 114 U/L (ref 39–117)
Bilirubin, Direct: 0.1 mg/dL (ref 0.0–0.3)
Total Bilirubin: 0.5 mg/dL (ref 0.2–1.2)
Total Protein: 7.5 g/dL (ref 6.0–8.3)

## 2024-02-15 LAB — LIPID PANEL
Cholesterol: 190 mg/dL (ref 0–200)
HDL: 66 mg/dL (ref >39.00)
LDL Cholesterol: 103 mg/dL — ABNORMAL HIGH (ref 0–99)
NonHDL: 124.13
Total CHOL/HDL Ratio: 3
Triglycerides: 104 mg/dL (ref 0.0–149.0)
VLDL: 20.8 mg/dL (ref 0.0–40.0)

## 2024-02-15 LAB — TSH: TSH: 1.76 u[IU]/mL (ref 0.35–5.50)

## 2024-02-15 LAB — VITAMIN D 25 HYDROXY (VIT D DEFICIENCY, FRACTURES): VITD: 41.4 ng/mL (ref 30.00–100.00)

## 2024-02-15 LAB — VITAMIN B12: Vitamin B-12: 561 pg/mL (ref 211–911)

## 2024-02-15 LAB — HEMOGLOBIN A1C: Hgb A1c MFr Bld: 6.3 % (ref 4.6–6.5)

## 2024-02-15 MED ORDER — AMLODIPINE BESYLATE 10 MG PO TABS: 10.0000 mg | ORAL_TABLET | Freq: Every day | ORAL | 3 refills | Status: AC

## 2024-02-15 MED ORDER — ATORVASTATIN CALCIUM 20 MG PO TABS: 20.0000 mg | ORAL_TABLET | Freq: Every day | ORAL | 3 refills | Status: DC

## 2024-02-15 MED ORDER — TELMISARTAN-HCTZ 80-25 MG PO TABS: 1.0000 | ORAL_TABLET | Freq: Every day | ORAL | 3 refills | Status: AC

## 2024-02-15 MED ORDER — AMITRIPTYLINE HCL 50 MG PO TABS: 50.0000 mg | ORAL_TABLET | Freq: Every day | ORAL | 1 refills | Status: DC

## 2024-02-15 MED ORDER — POTASSIUM CHLORIDE ER 10 MEQ PO TBCR: EXTENDED_RELEASE_TABLET | ORAL | 3 refills | Status: AC

## 2024-02-15 MED ORDER — ATORVASTATIN CALCIUM 40 MG PO TABS: 40.0000 mg | ORAL_TABLET | Freq: Every day | ORAL | 3 refills | Status: AC

## 2024-02-15 NOTE — Assessment & Plan Note (Signed)
 Mild intermittent at worst, f/u sport medicine for any worsening

## 2024-02-15 NOTE — Progress Notes (Unsigned)
 Patient ID: Julia Sanchez, female   DOB: [birth year], 78 y.o.   MRN: [id number]         Chief Complaint::  Annual Exam (Annual Exam. FYI That patient had bladder prolapse, has since had procedure through Dr.Yuen and follow up)  , low vit d, htn, hld, hyperglycemia, right knee arthritis       HPI:  Julia Sanchez is a 78 y.o. female here overall doing ok,  Pt denies chest pain, increased sob or doe, wheezing, orthopnea, PND, increased LE swelling, palpitations, dizziness or syncope.   Pt denies polydipsia, polyuria, or new focal neuro s/s.    S/p bladder prolapse surgury and UTI may 2025, also s/p right cataract.  Will have repeat DXA with Dr Johnnye GYN.  Has mild intermittent right knee arthritis without giveaways or falls.     Wt Readings from Last 3 Encounters:  02/15/24 168 lb 9.6 oz (76.5 kg)  12/07/23 169 lb (76.7 kg)  11/10/23 170 lb (77.1 kg)   BP Readings from Last 3 Encounters:  02/15/24 132/80  01/17/24 (!) 162/81  12/19/23 (!) 146/76   Immunization History  Administered Date(s) Administered   Influenza Split 04/23/2011   Influenza Whole 05/13/2009, 06/09/2012   Influenza, High Dose Seasonal PF 06/03/2016   Influenza, Quadrivalent, Recombinant, Inj, Pf 05/25/2018   Influenza,inj,Quad PF,6+ Mos 06/28/2014   Influenza-Unspecified 05/27/2017, 05/25/2018, 06/29/2018   Janssen (J&J) SARS-COV-2 Vaccination 09/10/2019   Pneumococcal Conjugate-13 10/24/2013   Pneumococcal Polysaccharide-23 11/20/2008, 01/02/2014   Td 10/11/2004, 11/19/2014   Health Maintenance Due  Topic Date Due   Zoster Vaccines- Shingrix (1 of 2) Never done      Past Medical History:  Diagnosis Date   Anxiety    due to stress - no stress   DIVERTICULOSIS, COLON 04/07/2009   HYPERLIPIDEMIA 05/03/2007   HYPERTENSION 05/03/2007   SVD (spontaneous vaginal delivery)    x 2   Past Surgical History:  Procedure Laterality Date   COLONOSCOPY     CYSTOSCOPY     CYSTOSCOPY N/A 12/07/2023   Procedure:  CYSTOSCOPY;  Surgeon: Guadlupe Lianne DASEN, MD;  Location: [facility] OR;  Service: Gynecology;  Laterality: N/A;   EYE SURGERY  05/2012   macular hole - left   EYE SURGERY  2014   cataract removed left eye   HYSTEROSCOPY WITH D & C  03/06/2013   Procedure: DILATATION AND CURETTAGE /HYSTEROSCOPY;  Surgeon: Charlie CHRISTELLA Johnnye, MD;  Location: [facility] ORS;  Service: Gynecology;;   INJECTION, BULKING AGENT, URETHRA N/A 12/07/2023   Procedure: INJECTION, BULKING AGENT, URETHRA;  Surgeon: Guadlupe Lianne DASEN, MD;  Location: [facility] OR;  Service: Gynecology;  Laterality: N/A;   LAPAROSCOPIC ASSISTED VAGINAL HYSTERECTOMY N/A 06/27/2014   Procedure: LAPAROSCOPIC ASSISTED VAGINAL HYSTERECTOMY WITH BILATERAL SALPINGO OOPHORECTOMY;  Surgeon: Charlie CHRISTELLA Johnnye, MD;  Location: [facility] OR[facility];  Service: Gynecology;  Laterality: N/A;   PERINEOPLASTY N/A 12/07/2023   Procedure: PERINEOPLASTY;  Surgeon: Guadlupe Lianne DASEN, MD;  Location: [facility] OR;  Service: Gynecology;  Laterality: N/A;  perineorrhaphy   RECTOCELE REPAIR N/A 12/07/2023   Procedure: COLPORRHAPHY, POSTERIOR, FOR RECTOCELE REPAIR;  Surgeon: Guadlupe Lianne DASEN, MD;  Location: [facility] OR;  Service: Gynecology;  Laterality: N/A;   SALPINGOOPHORECTOMY Bilateral 06/27/2014   Procedure: SALPINGO OOPHORECTOMY;  Surgeon: Charlie CHRISTELLA Johnnye, MD;  Location: [facility];  Service: Gynecology;  Laterality: Bilateral;   TONSILLECTOMY     VAGINECTOMY, PARTIAL N/A 12/07/2023   Procedure: VAGINECTOMY, PARTIAL;  Surgeon: Guadlupe Lianne DASEN, MD;  Location: [facility] OR;  Service: Gynecology;  Laterality: N/A;  partial colpecotomy; total time requested is 3 hours   WISDOM TOOTH EXTRACTION      reports that she has never smoked. She has never been exposed to tobacco smoke. She has never used smokeless tobacco. She reports current alcohol use of about 1.0 standard drink of alcohol per week. She reports that she does not use drugs. family history includes Breast cancer in her maternal aunt; Coronary artery disease in her father; Diabetes in her cousin and  maternal uncle; Heart disease in her father; Heart failure in her mother. Allergies  Allergen Reactions   Nitrofurantoin Nausea And Vomiting   Sulfonamide Derivatives Nausea And Vomiting   Current Outpatient Medications on File Prior to Visit  Medication Sig Dispense Refill   acetaminophen  (TYLENOL ) 500 MG tablet Take 1 tablet (500 mg total) by mouth every 6 (six) hours as needed (pain). (Patient taking differently: Take 1,000 mg by mouth every 4 (four) hours as needed for mild pain (pain score 1-3) or moderate pain (pain score 4-6).) 30 tablet 0   aspirin  EC 81 MG tablet Take 1 tablet (81 mg total) by mouth daily. 90 tablet 11   b complex vitamins capsule Take 1 capsule by mouth daily.     BESIVANCE 0.6 % SUSP Apply 1 drop to eye 3 (three) times daily.     Bromfenac Sodium 0.07 % SOLN Apply 1 drop to eye 2 (two) times daily.     Difluprednate 0.05 % EMUL      ibuprofen  (ADVIL ) 600 MG tablet Take 1 tablet (600 mg total) by mouth every 6 (six) hours as needed. (Patient taking differently: Take 600 mg by mouth every 4 (four) hours as needed for mild pain (pain score 1-3) or moderate pain (pain score 4-6).) 30 tablet 0   oxyCODONE  (OXY IR/ROXICODONE ) 5 MG immediate release tablet Take 1 tablet (5 mg total) by mouth every 4 (four) hours as needed for severe pain (pain score 7-10). 15 tablet 0   phenazopyridine  (PYRIDIUM ) 200 MG tablet Take 1 tablet (200 mg total) by mouth 3 (three) times daily as needed for pain. 10 tablet 0   tamsulosin  (FLOMAX ) 0.4 MG CAPS capsule Take 1 capsule (0.4 mg total) by mouth daily. 30 capsule 0   No current facility-administered medications on file prior to visit.        ROS:  All others reviewed and negative.  Objective        PE:  BP 132/80   Pulse 78   Temp 98.4 F (36.9 C)   Ht 5' 6.5 (1.689 m)   Wt 168 lb 9.6 oz (76.5 kg)   SpO2 99%   BMI 26.81 kg/m                 Constitutional: Pt appears in NAD               HENT: Head: NCAT.                 Right Ear: External ear normal.                 Left Ear: External ear normal.                Eyes: . Pupils are equal, round, and reactive to light. Conjunctivae and EOM are normal               Nose: without d/c or deformity  Neck: Neck supple. Gross normal ROM               Cardiovascular: Normal rate and regular rhythm.                 Pulmonary/Chest: Effort normal and breath sounds without rales or wheezing.                Abd:  Soft, NT, ND, + BS, no organomegaly               Neurological: Pt is alert. At baseline orientation, motor grossly intact               Skin: Skin is warm. No rashes, no other new lesions, LE edema - none               Psychiatric: Pt behavior is normal without agitation   Micro: none  Cardiac tracings I have personally interpreted today:  none  Pertinent Radiological findings (summarize): none   Lab Results  Component Value Date   WBC 6.2 02/15/2024   HGB 12.8 02/15/2024   HCT 36.8 02/15/2024   PLT 395.0 02/15/2024   GLUCOSE 117 (H) 02/15/2024   CHOL 190 02/15/2024   TRIG 104.0 02/15/2024   HDL 66.00 02/15/2024   LDLDIRECT 128.0 02/17/2010   LDLCALC 103 (H) 02/15/2024   ALT 16 02/15/2024   AST 18 02/15/2024   NA 135 02/15/2024   K 3.8 02/15/2024   CL 99 02/15/2024   CREATININE 0.73 02/15/2024   BUN 20 02/15/2024   CO2 29 02/15/2024   TSH 1.76 02/15/2024   HGBA1C 6.3 02/15/2024   Assessment/Plan:  Julia Sanchez is a 78 y.o. White or Caucasian [1] female with  has a past medical history of Anxiety, DIVERTICULOSIS, COLON (04/07/2009), HYPERLIPIDEMIA (05/03/2007), HYPERTENSION (05/03/2007), and SVD (spontaneous vaginal delivery).  Arthritis of right knee Mild intermittent at worst, f/u sport medicine for any worsening  Vitamin D  deficiency Last vitamin D  Lab Results  Component Value Date   VD25OH 41.40 02/15/2024   Stable, cont oral replacement   Hypertensive disorder BP Readings from Last 3 Encounters:  02/15/24  132/80  01/17/24 (!) 162/81  12/19/23 (!) 146/76   Stable, pt to continue medical treatment norvasc  10 every day, micardis  hct 80 25 every day,    Hyperlipidemia Lab Results  Component Value Date   LDLCALC 103 (H) 02/15/2024   uncontrolled, pt for increased lipitor 40 mg qd   Hyperglycemia Lab Results  Component Value Date   HGBA1C 6.3 02/15/2024   Stable, pt to continue current medical treatment  - diet, wt control  Followup: Return in about 1 year (around 02/14/2025).  Lynwood Rush, MD 02/16/2024 7:21 PM [facility] Medicine

## 2024-02-15 NOTE — Patient Instructions (Addendum)
 Please have your Shingrix (shingles) shots done at your local pharmacy.  Ok to increase the OTC Vit D to 2000 units per day  Please continue all other medications as before, and refills have been done if requested.  Please have the pharmacy call with any other refills you may need.  Please continue your efforts at being more active, low cholesterol diet, and weight control.  You are otherwise up to date with prevention measures today.  Please keep your appointments with your specialists as you may have planned  Please go to the LAB at the blood drawing area for the tests to be done  You will be contacted by phone if any changes need to be made immediately.  Otherwise, you will receive a letter about your results with an explanation, but please check with MyChart first.  Please make an Appointment to return for your 1 year visit, or sooner if needed

## 2024-02-16 ENCOUNTER — Encounter: Payer: Self-pay | Admitting: Internal Medicine

## 2024-02-16 NOTE — Assessment & Plan Note (Signed)
 Lab Results  Component Value Date   HGBA1C 6.3 02/15/2024   Stable, pt to continue current medical treatment  - diet, wt control

## 2024-02-16 NOTE — Assessment & Plan Note (Signed)
 Last vitamin D  Lab Results  Component Value Date   VD25OH 41.40 02/15/2024   Stable, cont oral replacement

## 2024-02-16 NOTE — Assessment & Plan Note (Signed)
 Lab Results  Component Value Date   LDLCALC 103 (H) 02/15/2024   uncontrolled, pt for increased lipitor 40 mg qd

## 2024-02-16 NOTE — Assessment & Plan Note (Signed)
 BP Readings from Last 3 Encounters:  02/15/24 132/80  01/17/24 (!) 162/81  12/19/23 (!) 146/76   Stable, pt to continue medical treatment norvasc  10 every day, micardis  hct 80 25 every day,

## 2024-02-27 NOTE — Progress Notes (Unsigned)
 Julia Sanchez  Date of Visit: 02/29/2024  History of Present Illness: Julia Sanchez is a 78 y.o. female scheduled today for a 3 month post-operative visit.   Surgery: s/p Colpocleisis, posterior repair, perineorrhaphy, levator plication, urethral bulking, cystourethroscopy on 12/07/23  She passed her postoperative void trial on POD#15  Postoperative course complicated by urinary retention and UTI.   Negative UA 02/15/24 Resume vaginal estrogen Underwent cataract surgery without complications  Denies signs or symptoms of urinary retention, reports initial position change for voids the week after foley removal. Denies UTI symptoms, vaginal discharge, bleeding or prolapse symptoms.  Presented on POD#1 due to minimal UOP after foley removal at home with UOP when 12Fr foley was replaced.  POD#5 returned to office for repeat void trial and voided after retrograde filled for . Patient reports increased sensation of incomplete emptying and returned to ED on POD#6 with of UOP after 14 Fr foley placement.   UTI in the last 6 weeks? Denies since last visit Pain? Used 1 oxycodone  since surgery, well controlled with NSAIDs and Tylenol   She has returned to her normal activity (except for postop restrictions) Vaginal bulge? No  Bowel issues: continues miralax 1.5 tsp and metamucil daily with regular daily bowel movements  Subjective Success: Do you usually have a bulge or something falling out that you can see or feel in the vaginal area? No  Retreatment Success: Any retreatment with surgery or pessary for any compartment? 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   Allergies: Patient is allergic to nitrofurantoin and sulfonamide derivatives.   Physical Exam: BP 122/72 (BP Location: Left Arm, Patient Position: Sitting, Cuff Size: Normal)    Today's Vitals   02/29/24 1455  BP: 122/72    There is no height or weight on file to calculate BMI.  Physical Exam Constitutional:      General: She is not in acute distress.    Appearance: Normal appearance.  Genitourinary:     Bladder and urethral meatus normal.     Genitourinary Comments: Genital hiatus 2cm     Right Labia: No rash, tenderness, lesions, skin changes or Bartholin's cyst.    Left Labia: No tenderness, lesions, skin changes, Bartholin's cyst or rash.    Vaginal cuff intact.    No vaginal discharge, erythema, tenderness, bleeding, ulceration or granulation tissue.     No vaginal prolapse present.    Mild vaginal atrophy present.    Cervix is absent.     Uterus is absent.     Urethral meatus caruncle not present.    No urethral tenderness present.     Bladder is not tender, urgency on palpation not present and masses not present.   Cardiovascular:     Rate and Rhythm: Normal rate.  Pulmonary:     Effort: Pulmonary effort is normal. No respiratory distress.  Neurological:     Mental Status: She is alert.  Vitals reviewed. Exam conducted with a chaperone present.     ---------------------------------------------------------  Assessment and Plan:  1. Postop check    Postop check  VAIN III (vaginal intraepithelial neoplasia III) Assessment & Plan: - s/p Colpocleisis with excision of vaginal epithelium with clear margins. - Discussed need for repeat vaginal exam by Dr. Johnnye in  6 months or PRN symptoms

## 2024-02-28 ENCOUNTER — Encounter: Admitting: Obstetrics

## 2024-02-29 ENCOUNTER — Encounter: Payer: Self-pay | Admitting: Obstetrics

## 2024-02-29 ENCOUNTER — Ambulatory Visit (INDEPENDENT_AMBULATORY_CARE_PROVIDER_SITE_OTHER): Admitting: Obstetrics

## 2024-02-29 VITALS — BP 122/72

## 2024-02-29 DIAGNOSIS — Z09 Encounter for follow-up examination after completed treatment for conditions other than malignant neoplasm: Secondary | ICD-10-CM | POA: Insufficient documentation

## 2024-02-29 DIAGNOSIS — Z48816 Encounter for surgical aftercare following surgery on the genitourinary system: Secondary | ICD-10-CM

## 2024-02-29 DIAGNOSIS — D072 Carcinoma in situ of vagina: Secondary | ICD-10-CM

## 2024-02-29 NOTE — Patient Instructions (Addendum)
 Continue vaginal estrogen 1g twice a week.   Please call if you experience any change in vaginal or urinary symptoms.   Continue to follow-up with Dr. Johnnye for surveillance of your history of VAIN II

## 2024-02-29 NOTE — Assessment & Plan Note (Signed)
-   s/p Colpocleisis with excision of vaginal epithelium with clear margins. - Discussed need for repeat vaginal exam by Dr. Johnnye in 6 months or PRN symptoms

## 2024-05-24 ENCOUNTER — Other Ambulatory Visit: Payer: Self-pay | Admitting: Internal Medicine

## 2024-05-24 ENCOUNTER — Other Ambulatory Visit: Payer: Self-pay

## 2024-08-11 ENCOUNTER — Other Ambulatory Visit: Payer: Self-pay | Admitting: Internal Medicine

## 2024-08-13 ENCOUNTER — Other Ambulatory Visit: Payer: Self-pay

## 2024-08-20 ENCOUNTER — Encounter: Payer: Self-pay | Admitting: *Deleted

## 2024-11-13 ENCOUNTER — Ambulatory Visit

## 2024-12-06 ENCOUNTER — Ambulatory Visit

## 2025-02-15 ENCOUNTER — Encounter: Admitting: Internal Medicine
